# Patient Record
Sex: Male | Born: 1940 | Race: White | Hispanic: No | Marital: Married | Smoking: Never smoker
Health system: Southern US, Community
[De-identification: ages and names within clinical notes are randomized; demographics above are authoritative.]

## PROBLEM LIST (undated history)

## (undated) DIAGNOSIS — T7840XA Allergy, unspecified, initial encounter: Secondary | ICD-10-CM

## (undated) DIAGNOSIS — E785 Hyperlipidemia, unspecified: Secondary | ICD-10-CM

## (undated) DIAGNOSIS — E039 Hypothyroidism, unspecified: Secondary | ICD-10-CM

## (undated) DIAGNOSIS — I350 Nonrheumatic aortic (valve) stenosis: Secondary | ICD-10-CM

## (undated) DIAGNOSIS — M1990 Unspecified osteoarthritis, unspecified site: Secondary | ICD-10-CM

## (undated) DIAGNOSIS — N529 Male erectile dysfunction, unspecified: Secondary | ICD-10-CM

## (undated) DIAGNOSIS — N400 Enlarged prostate without lower urinary tract symptoms: Secondary | ICD-10-CM

## (undated) DIAGNOSIS — Z86010 Personal history of colonic polyps: Secondary | ICD-10-CM

## (undated) DIAGNOSIS — H269 Unspecified cataract: Secondary | ICD-10-CM

## (undated) DIAGNOSIS — K219 Gastro-esophageal reflux disease without esophagitis: Secondary | ICD-10-CM

## (undated) DIAGNOSIS — I10 Essential (primary) hypertension: Secondary | ICD-10-CM

## (undated) DIAGNOSIS — E669 Obesity, unspecified: Secondary | ICD-10-CM

## (undated) DIAGNOSIS — E291 Testicular hypofunction: Secondary | ICD-10-CM

## (undated) DIAGNOSIS — Z860101 Personal history of adenomatous and serrated colon polyps: Secondary | ICD-10-CM

## (undated) HISTORY — DX: Personal history of adenomatous and serrated colon polyps: Z86.0101

## (undated) HISTORY — DX: Testicular hypofunction: E29.1

## (undated) HISTORY — DX: Hypothyroidism, unspecified: E03.9

## (undated) HISTORY — DX: Unspecified cataract: H26.9

## (undated) HISTORY — DX: Personal history of colonic polyps: Z86.010

## (undated) HISTORY — DX: Essential (primary) hypertension: I10

## (undated) HISTORY — DX: Obesity, unspecified: E66.9

## (undated) HISTORY — DX: Hyperlipidemia, unspecified: E78.5

## (undated) HISTORY — DX: Unspecified osteoarthritis, unspecified site: M19.90

## (undated) HISTORY — DX: Gastro-esophageal reflux disease without esophagitis: K21.9

## (undated) HISTORY — DX: Nonrheumatic aortic (valve) stenosis: I35.0

## (undated) HISTORY — DX: Allergy, unspecified, initial encounter: T78.40XA

## (undated) HISTORY — PX: APPENDECTOMY: SHX54

## (undated) HISTORY — DX: Benign prostatic hyperplasia without lower urinary tract symptoms: N40.0

## (undated) HISTORY — DX: Male erectile dysfunction, unspecified: N52.9

---

## 1968-11-16 HISTORY — PX: HERNIA REPAIR: SHX51

## 1998-12-23 ENCOUNTER — Encounter: Payer: Self-pay | Admitting: Specialist

## 1998-12-23 ENCOUNTER — Ambulatory Visit (HOSPITAL_COMMUNITY): Admission: RE | Admit: 1998-12-23 | Discharge: 1998-12-23 | Payer: Self-pay | Admitting: Specialist

## 1999-01-09 ENCOUNTER — Other Ambulatory Visit: Admission: RE | Admit: 1999-01-09 | Discharge: 1999-01-09 | Payer: Self-pay

## 2001-03-03 ENCOUNTER — Encounter: Admission: RE | Admit: 2001-03-03 | Discharge: 2001-06-01 | Payer: Self-pay | Admitting: *Deleted

## 2003-08-27 ENCOUNTER — Ambulatory Visit (HOSPITAL_COMMUNITY): Admission: RE | Admit: 2003-08-27 | Discharge: 2003-08-27 | Payer: Self-pay | Admitting: Gastroenterology

## 2003-08-27 ENCOUNTER — Encounter (INDEPENDENT_AMBULATORY_CARE_PROVIDER_SITE_OTHER): Payer: Self-pay | Admitting: *Deleted

## 2006-06-17 ENCOUNTER — Ambulatory Visit: Payer: Self-pay | Admitting: Family Medicine

## 2006-11-04 ENCOUNTER — Ambulatory Visit: Payer: Self-pay | Admitting: Family Medicine

## 2007-02-01 ENCOUNTER — Ambulatory Visit: Payer: Self-pay | Admitting: Family Medicine

## 2007-04-12 ENCOUNTER — Ambulatory Visit: Payer: Self-pay | Admitting: Family Medicine

## 2007-12-26 ENCOUNTER — Ambulatory Visit: Payer: Self-pay | Admitting: Family Medicine

## 2008-04-10 ENCOUNTER — Ambulatory Visit: Payer: Self-pay | Admitting: Family Medicine

## 2008-04-19 ENCOUNTER — Ambulatory Visit: Payer: Self-pay | Admitting: Family Medicine

## 2008-08-16 HISTORY — PX: COLONOSCOPY: SHX174

## 2008-08-20 ENCOUNTER — Ambulatory Visit: Payer: Self-pay | Admitting: Family Medicine

## 2009-06-03 ENCOUNTER — Ambulatory Visit: Payer: Self-pay | Admitting: Family Medicine

## 2009-12-09 ENCOUNTER — Ambulatory Visit: Payer: Self-pay | Admitting: Family Medicine

## 2010-06-23 ENCOUNTER — Ambulatory Visit: Payer: Self-pay | Admitting: Family Medicine

## 2011-02-23 ENCOUNTER — Ambulatory Visit (INDEPENDENT_AMBULATORY_CARE_PROVIDER_SITE_OTHER): Payer: Medicare Other | Admitting: Family Medicine

## 2011-02-23 DIAGNOSIS — M461 Sacroiliitis, not elsewhere classified: Secondary | ICD-10-CM

## 2011-02-23 DIAGNOSIS — E039 Hypothyroidism, unspecified: Secondary | ICD-10-CM

## 2011-02-23 DIAGNOSIS — E291 Testicular hypofunction: Secondary | ICD-10-CM

## 2011-02-23 DIAGNOSIS — Z79899 Other long term (current) drug therapy: Secondary | ICD-10-CM

## 2011-03-19 ENCOUNTER — Encounter: Payer: Self-pay | Admitting: Family Medicine

## 2011-04-03 NOTE — Op Note (Signed)
NAME:  Mitchell Humphrey, Mitchell Humphrey                              ACCOUNT NO.:  [id number]   MEDICAL RECORD NO.:  [id number]                   PATIENT TYPE:  AMB   LOCATION:  ENDO                                 FACILITY:  [facility]   PHYSICIAN:  Petra Kuba, M.D.                 DATE OF BIRTH:  [birth year]   DATE OF PROCEDURE:  08/27/2003  DATE OF DISCHARGE:                                 OPERATIVE REPORT   PROCEDURE:  Colonoscopy with hot biopsy.   INDICATIONS:  The patient with some bright red blood per rectum probably due  to hemorrhoids, due for a colonic screening.  Consent was signed after risks  and benefits, methods and options wee thoroughly discussed in the office.   MEDICINES USED:  Demerol 50, Versed 5.   DESCRIPTION OF PROCEDURE:  The rectal inspection is pertinent for external  hemorrhoids.  A small digital exam was negative.  The video colonoscope was  inserted, easily advanced around the colon to the cecum.  This did not  require any abdominal pressure or any position changes.  No abnormalities  were seen on insertion.  The cecum was identified by the appendiceal orifice  and the ileocecal valve.  The prep was adequately.  There was some liquid  stool that required washing and suctioning.  In the cecal pole, a tiny polyp  was seen and was carefully hot biopsied on a setting of 15.  The scope was  slowly withdrawn.  In the mid transverse, another tiny polyp was seen and  was hot biopsied on a setting of 20:20.  Both polyps were put in the same  container.  The scope was further withdrawn.  No other abnormalities were  seen as we slowly withdrew back to the rectum.  The anorectal pull through  on retroflexion confirmed some small hemorrhoids.  The scope was re-inserted  a short ways up the left side of the colon.  The air was suctioned.  The  scope removed.  The patient tolerated the procedure well.  There were no  obvious immediate complications.   ENDOSCOPIC DIAGNOSES:  1. Internal  and external hemorrhoids.  2. Two tiny polyps in the transverse and cecum, hot biopsied.  3. Otherwise within normal limits to the cecum.   PLAN:  Await pathology.  Probably recheck colon screening in five years.  Happy to see back p.r.n. otherwise return to the care of Dr. Excell Seltzer for the  customary health care maintenance to include yearly rectals and guaiacs.                                                Petra Kuba, M.D.    MEM/MEDQ  D:  08/27/2003  T:  08/27/2003  Job:  [id number]  cc:   Christella Noa, M.D.  [address]  [place], [place] [address]  Fax: [phone]

## 2011-10-28 ENCOUNTER — Telehealth: Payer: Self-pay | Admitting: Internal Medicine

## 2011-10-28 NOTE — Telephone Encounter (Signed)
Refill the AndroGel but make sure he gets an appointment

## 2011-10-29 ENCOUNTER — Other Ambulatory Visit: Payer: Self-pay

## 2011-10-29 MED ORDER — TESTOSTERONE 20.25 MG/ACT (1.62%) TD GEL: 2.0000 | Freq: Every day | TRANSDERMAL | Status: DC

## 2011-10-29 NOTE — Telephone Encounter (Signed)
cheri said she called it in 12/13

## 2012-05-25 ENCOUNTER — Encounter: Payer: Self-pay | Admitting: Medical

## 2012-05-25 ENCOUNTER — Other Ambulatory Visit: Payer: Self-pay | Admitting: Medical

## 2012-05-25 ENCOUNTER — Ambulatory Visit (INDEPENDENT_AMBULATORY_CARE_PROVIDER_SITE_OTHER): Payer: Medicare Other | Admitting: Medical

## 2012-05-25 VITALS — BP 112/70 | HR 60 | Temp 97.9°F | Resp 16 | Wt 236.0 lb

## 2012-05-25 DIAGNOSIS — Z23 Encounter for immunization: Secondary | ICD-10-CM

## 2012-05-25 DIAGNOSIS — I10 Essential (primary) hypertension: Secondary | ICD-10-CM

## 2012-05-25 DIAGNOSIS — Z125 Encounter for screening for malignant neoplasm of prostate: Secondary | ICD-10-CM

## 2012-05-25 DIAGNOSIS — E039 Hypothyroidism, unspecified: Secondary | ICD-10-CM

## 2012-05-25 DIAGNOSIS — E291 Testicular hypofunction: Secondary | ICD-10-CM

## 2012-05-25 DIAGNOSIS — E785 Hyperlipidemia, unspecified: Secondary | ICD-10-CM

## 2012-05-25 LAB — COMPREHENSIVE METABOLIC PANEL
ALT: 15 U/L (ref 0–53)
AST: 13 U/L (ref 0–37)
Albumin: 4.4 g/dL (ref 3.5–5.2)
Alkaline Phosphatase: 37 U/L — ABNORMAL LOW (ref 39–117)
BUN: 14 mg/dL (ref 6–23)
CO2: 26 mEq/L (ref 19–32)
Calcium: 9.6 mg/dL (ref 8.4–10.5)
Chloride: 101 mEq/L (ref 96–112)
Creat: 0.92 mg/dL (ref 0.50–1.35)
Glucose, Bld: 94 mg/dL (ref 70–99)
Potassium: 4.9 mEq/L (ref 3.5–5.3)
Sodium: 139 mEq/L (ref 135–145)
Total Bilirubin: 0.5 mg/dL (ref 0.3–1.2)
Total Protein: 6.9 g/dL (ref 6.0–8.3)

## 2012-05-25 LAB — POCT URINALYSIS DIPSTICK
Glucose, UA: NEGATIVE
Ketones, UA: NEGATIVE
Leukocytes, UA: NEGATIVE
Protein, UA: NEGATIVE
Spec Grav, UA: <=1.005
Urobilinogen, UA: NEGATIVE

## 2012-05-25 LAB — LIPID PANEL
Cholesterol: 234 mg/dL — ABNORMAL HIGH (ref 0–200)
Triglycerides: 205 mg/dL — ABNORMAL HIGH (ref <150)

## 2012-05-25 LAB — CBC
Hemoglobin: 17.5 g/dL — ABNORMAL HIGH (ref 13.0–17.0)
MCH: 28 pg (ref 26.0–34.0)
MCV: 81 fL (ref 78.0–100.0)
Platelets: 259 10*3/uL (ref 150–400)
RBC: 6.25 MIL/uL — ABNORMAL HIGH (ref 4.22–5.81)
WBC: 6.4 10*3/uL (ref 4.0–10.5)

## 2012-05-25 LAB — TSH: TSH: 1.499 u[IU]/mL (ref 0.350–4.500)

## 2012-05-25 MED ORDER — CETIRIZINE HCL 10 MG PO TABS: 10.0000 mg | ORAL_TABLET | ORAL | Status: DC | PRN

## 2012-05-25 MED ORDER — TESTOSTERONE 20.25 MG/ACT (1.62%) TD GEL: 2.0000 | Freq: Every day | TRANSDERMAL | Status: DC

## 2012-05-25 MED ORDER — ENALAPRIL-HYDROCHLOROTHIAZIDE 10-25 MG PO TABS: 1.0000 | ORAL_TABLET | Freq: Every day | ORAL | Status: DC

## 2012-05-25 MED ORDER — LEVOTHYROXINE SODIUM 125 MCG PO TABS: 125.0000 ug | ORAL_TABLET | Freq: Every day | ORAL | Status: DC

## 2012-05-25 NOTE — Addendum Note (Signed)
Addended by: Debbrah Alar F on: 05/25/2012 08:46 AM   Modules accepted: Orders

## 2012-05-25 NOTE — Progress Notes (Signed)
Subjective: Here for general recheck.  Needs refills.  On medication for hypertension, thyroid, hypogonadism, and takes OTC fish oil for lipids.  In general been doing well, no c/o.   Checks BP once in a while, gets normal readings.  He is completely out of Androgel for the last 3 days, but still has a few left of all his other medications.  Gets 90 day supply to [facility].  He is fasting today.  He is a nonsmoker.  Exercises with walking, works in [employer].  Tries to eat healthy, but not much fried foods.  Lately he is working day and night, so sometimes diet not the best.   Eats a lot of baked or broiled foods, salads.     No other c/o.  The following portions of the patient's history were reviewed and updated as appropriate: allergies, current medications, past family history, past medical history, past social history, past surgical history and problem list.  Past Medical History  Diagnosis Date  . Hypertension   . Obesity   . Hypothyroid   . Hx of adenomatous colonic polyps   . GERD (gastroesophageal reflux disease)   . BPH (benign prostatic hyperplasia)   . Hypogonadism male   . Erectile dysfunction   . Dyslipidemia   . Allergy     Allergies  Allergen Reactions  . Penicillins     Review of Systems ROS reviewed and was negative other than noted in HPI or above.    Objective:   Physical Exam  General appearance: alert, no distress, WD/WN Oral cavity: MMM, no lesions Neck: supple, no lymphadenopathy, no thyromegaly, no masses Heart: RRR, normal S1, S2, no murmurs Lungs: CTA bilaterally, no wheezes, rhonchi, or rales Abdomen: +bs, soft, non tender, non distended, no masses, no hepatomegaly, no splenomegaly Pulses: 2+ symmetric, upper and lower extremities, normal cap refill   Assessment and Plan :     Encounter Diagnoses  Name Primary?  . Essential hypertension, benign Yes  . Hypothyroidism   . Dyslipidemia   . Hypogonadism male   . Special screening for  malignant neoplasm of prostate    HTN - controlled, c/t same medication, refills today  Hypothyroidism - labs today, c/t same meds  Dyslipidemia - labs today  Hypogonadism male - scripts today, labs today  PSA screening today  Labs and refills today.  Advised healthy diet, exercise regularly, sunscreen since he is outdoors often working.  Updated his Tdap and Pneumococcal vaccines today, VIS and counseling given.

## 2012-05-26 LAB — PSA, MEDICARE: PSA: 7.01 ng/mL — ABNORMAL HIGH (ref <=4.00)

## 2012-05-31 ENCOUNTER — Other Ambulatory Visit: Payer: Self-pay | Admitting: Medical

## 2012-05-31 DIAGNOSIS — E291 Testicular hypofunction: Secondary | ICD-10-CM

## 2012-05-31 DIAGNOSIS — R972 Elevated prostate specific antigen [PSA]: Secondary | ICD-10-CM

## 2012-05-31 LAB — FSH/LH: LH: 0.6 m[IU]/mL — ABNORMAL LOW (ref 1.5–9.3)

## 2012-05-31 LAB — PSA, TOTAL AND FREE: PSA: 6.18 ng/mL — ABNORMAL HIGH (ref <=4.00)

## 2012-05-31 LAB — PROLACTIN: Prolactin: 4.7 ng/mL (ref 2.1–17.1)

## 2012-05-31 MED ORDER — ATORVASTATIN CALCIUM 20 MG PO TABS: 20.0000 mg | ORAL_TABLET | Freq: Every day | ORAL | Status: DC

## 2012-06-09 ENCOUNTER — Telehealth: Payer: Self-pay | Admitting: Medical

## 2012-06-10 NOTE — Telephone Encounter (Signed)
Urologist Dr. Wanda Plump is not longer at [facility] urology that i have him scheduled with Dr. Vernie Ammons on august 22,2013 @ 1:00pm . Will fax over OV fax # [phone]

## 2012-06-10 NOTE — Telephone Encounter (Signed)
Pt states he has not seen a urologist before on a colonoscopy doctor. Would you like me to send him to [facility]>?

## 2012-06-10 NOTE — Telephone Encounter (Signed)
pls pull chart, as i am pretty sure he saw urology once before.

## 2012-06-10 NOTE — Telephone Encounter (Signed)
pls pull chart or ask Mitchell Humphrey to reprint labs for [facility], LH, free PSA, Prolactin.  Per my last instructions to Mitchell Humphrey, his labs are abnormal.  I want to refer him back to his Urologist who he has seen in the past.  The referral is for hypogonadism, lack of response to testosterone and elevated PSA.   Check to see because may have already made the referral and sent the labs and OV note to them.  Verify and let pt know.

## 2012-06-13 ENCOUNTER — Telehealth: Payer: Self-pay | Admitting: Family Medicine

## 2012-06-13 ENCOUNTER — Encounter: Payer: Self-pay | Admitting: Medical

## 2012-06-13 NOTE — Telephone Encounter (Signed)
Wife called and states they found the name of the urologist that the pt has used in the past.  Dr. Boston Service at [facility] [phone].  Wife wants to see if you can get him in quicker with this dr.

## 2012-06-13 NOTE — Telephone Encounter (Signed)
Look at previous telephone encounter. It notes that that Dr. She requested is no longer there! Pt was notified of that and what Dr. Quincy Carnes will be seeing

## 2012-06-14 ENCOUNTER — Telehealth: Payer: Self-pay | Admitting: Internal Medicine

## 2012-06-14 NOTE — Telephone Encounter (Signed)
pt wife called stating she got him with a urologist tomorrow in [place] to be seen with Dr. Fabiola Backer. and i faxed over labs and ov notes for the Dr. to view. fax # [phone]. i will call and cancel his appt for [facility]

## 2012-07-25 ENCOUNTER — Telehealth: Payer: Self-pay | Admitting: Internal Medicine

## 2012-07-26 NOTE — Telephone Encounter (Signed)
Received refill request for testosterone.   i reviewed Urology note from where he saw them in July.  They were holding off on testosterone therapy until the PSA was back to normal.  He needs to f/u with Urology about what to do with his testosterone treatment at this point.  Has he seen them in f/u since visit in July?

## 2012-07-26 NOTE — Telephone Encounter (Signed)
Patient chart is on your desk. CLS

## 2012-07-26 NOTE — Telephone Encounter (Signed)
Pull chart.  We had referred to urology.

## 2012-07-26 NOTE — Telephone Encounter (Signed)
Patient was made aware that he will need to follow up with Urology about his Testosterone treatment. CLS

## 2013-03-15 DIAGNOSIS — R972 Elevated prostate specific antigen [PSA]: Secondary | ICD-10-CM | POA: Insufficient documentation

## 2013-03-15 DIAGNOSIS — R350 Frequency of micturition: Secondary | ICD-10-CM | POA: Insufficient documentation

## 2013-03-15 DIAGNOSIS — N3941 Urge incontinence: Secondary | ICD-10-CM | POA: Insufficient documentation

## 2013-04-01 ENCOUNTER — Other Ambulatory Visit: Payer: Self-pay | Admitting: Medical

## 2013-04-03 NOTE — Telephone Encounter (Signed)
PATIENT NEEDS TO SCHEDULE A FOLLOW UP VISIT BEFORE HIS MEDICATIONS RUNS OUT

## 2013-04-24 ENCOUNTER — Telehealth: Payer: Self-pay | Admitting: Medical

## 2013-04-25 ENCOUNTER — Other Ambulatory Visit: Payer: Self-pay | Admitting: Family Medicine

## 2013-04-25 DIAGNOSIS — I10 Essential (primary) hypertension: Secondary | ICD-10-CM

## 2013-04-25 MED ORDER — ENALAPRIL-HYDROCHLOROTHIAZIDE 10-25 MG PO TABS: 1.0000 | ORAL_TABLET | Freq: Every day | ORAL | Status: DC

## 2013-04-25 NOTE — Telephone Encounter (Signed)
Patients refill was sent to the pharmacy. CLS

## 2013-06-05 ENCOUNTER — Telehealth: Payer: Self-pay | Admitting: Family Medicine

## 2013-06-05 NOTE — Telephone Encounter (Signed)
Message copied by Janeice Robinson on Mon Jun 05, 2013  4:29 PM ------      Message from: Jac Canavan      Created: Mon Jun 05, 2013  8:01 AM       Due for yearly f/u ------

## 2013-06-05 NOTE — Telephone Encounter (Signed)
Patient is aware about setting up an appointment but he states he will have to call back to set the appointment up once he looks at his schedule. CLS

## 2013-07-03 ENCOUNTER — Other Ambulatory Visit: Payer: Self-pay | Admitting: Medical

## 2013-10-26 ENCOUNTER — Telehealth: Payer: Self-pay | Admitting: Internal Medicine

## 2013-10-26 NOTE — Telephone Encounter (Signed)
Faxed medical records to [facility] @ [phone]

## 2015-02-04 DIAGNOSIS — Z85820 Personal history of malignant melanoma of skin: Secondary | ICD-10-CM | POA: Insufficient documentation

## 2016-04-05 ENCOUNTER — Emergency Department (HOSPITAL_COMMUNITY)
Admission: EM | Admit: 2016-04-05 | Discharge: 2016-04-05 | Disposition: A | Discharge status: Home / Self Care | Payer: Medicare (Managed Care) | Attending: Emergency Medicine | Admitting: Emergency Medicine

## 2016-04-05 ENCOUNTER — Encounter (HOSPITAL_COMMUNITY): Payer: Self-pay | Admitting: Family Medicine

## 2016-04-05 DIAGNOSIS — Z880 Allergy status to penicillin: Secondary | ICD-10-CM | POA: Insufficient documentation

## 2016-04-05 DIAGNOSIS — Z86010 Personal history of colonic polyps: Secondary | ICD-10-CM | POA: Diagnosis not present

## 2016-04-05 DIAGNOSIS — Z87438 Personal history of other diseases of male genital organs: Secondary | ICD-10-CM | POA: Diagnosis not present

## 2016-04-05 DIAGNOSIS — Z8719 Personal history of other diseases of the digestive system: Secondary | ICD-10-CM | POA: Diagnosis not present

## 2016-04-05 DIAGNOSIS — E785 Hyperlipidemia, unspecified: Secondary | ICD-10-CM | POA: Insufficient documentation

## 2016-04-05 DIAGNOSIS — E669 Obesity, unspecified: Secondary | ICD-10-CM | POA: Diagnosis not present

## 2016-04-05 DIAGNOSIS — Z7982 Long term (current) use of aspirin: Secondary | ICD-10-CM | POA: Diagnosis not present

## 2016-04-05 DIAGNOSIS — Z79899 Other long term (current) drug therapy: Secondary | ICD-10-CM | POA: Diagnosis not present

## 2016-04-05 DIAGNOSIS — I10 Essential (primary) hypertension: Secondary | ICD-10-CM | POA: Insufficient documentation

## 2016-04-05 DIAGNOSIS — M545 Low back pain: Secondary | ICD-10-CM | POA: Diagnosis present

## 2016-04-05 DIAGNOSIS — M5432 Sciatica, left side: Secondary | ICD-10-CM

## 2016-04-05 DIAGNOSIS — E039 Hypothyroidism, unspecified: Secondary | ICD-10-CM | POA: Insufficient documentation

## 2016-04-05 MED ORDER — NAPROXEN 250 MG PO TABS
500.0000 mg | ORAL_TABLET | Freq: Once | ORAL | Status: AC
  Administered 2016-04-05: 500 mg via ORAL
  Filled 2016-04-05: qty 2

## 2016-04-05 MED ORDER — NAPROXEN 500 MG PO TABS: 500.0000 mg | ORAL_TABLET | Freq: Two times a day (BID) | ORAL | Status: DC

## 2016-04-05 MED ORDER — DIAZEPAM 5 MG PO TABS: 5.0000 mg | ORAL_TABLET | Freq: Two times a day (BID) | ORAL | Status: DC

## 2016-04-05 MED ORDER — DIAZEPAM 5 MG PO TABS
5.0000 mg | ORAL_TABLET | Freq: Once | ORAL | Status: AC
  Administered 2016-04-05: 5 mg via ORAL
  Filled 2016-04-05: qty 1

## 2016-04-05 NOTE — Discharge Instructions (Signed)
[name],  Nice meeting you! Please follow-up with your primary care provider within one week. Return to the emergency department if you develop fevers, chills, increased pain, loss of bowel or bladder control, nausea, vomiting, new/worsening symptoms. Feel better soon!  S. Wendie Simmer, PA-C

## 2016-04-05 NOTE — ED Notes (Signed)
Declined W/C at D/C and was escorted to lobby by RN. 

## 2016-04-05 NOTE — ED Provider Notes (Signed)
CSN: [id number]     Arrival date & time 04/05/16  1145 History   First MD Initiated Contact with Patient 04/05/16 1319     Chief Complaint  Patient presents with  . Back Pain  . Leg Pain   HPI   Mitchell Humphrey is a 75 y.o. male PMH significant for HTN, obesity presenting with a 1 day history of left-sided lower back pain. He describes the pain as 10/10 pain scale, worsened with movement, radiating down left leg posteriorly, constant, sharp. He denies fevers, chills, chest pain, shortness of breath, abdominal pain, nausea, vomiting, loss of bowel or bladder control, recent steroid injections, IV drug use.  Past Medical History  Diagnosis Date  . Hypertension   . Obesity   . Hypothyroid   . Hx of adenomatous colonic polyps   . GERD (gastroesophageal reflux disease)   . BPH (benign prostatic hyperplasia)   . Hypogonadism male   . Erectile dysfunction   . Dyslipidemia   . Allergy    Past Surgical History  Procedure Laterality Date  . Colonoscopy  08/2008    Dr. Watt Climes   History reviewed. No pertinent family history. Social History  Substance Use Topics  . Smoking status: Never Smoker   . Smokeless tobacco: None  . Alcohol Use: No    Review of Systems  Ten systems are reviewed and are negative for acute change except as noted in the HPI  Allergies  Penicillins  Home Medications   Prior to Admission medications   Medication Sig Start Date End Date Taking? Authorizing Provider  aspirin 81 MG tablet Take 81 mg by mouth daily.      Historical Provider, MD  atorvastatin (LIPITOR) 20 MG tablet TAKE 1 TABLET DAILY 07/03/13   Camelia Eng Tysinger, PA-C  cetirizine (ZYRTEC) 10 MG tablet Take 1 tablet (10 mg total) by mouth as needed. 05/25/12   Camelia Eng Tysinger, PA-C  diazepam (VALIUM) 5 MG tablet Take 1 tablet (5 mg total) by mouth 2 (two) times daily. 04/05/16   [place] Lions, PA-C  enalapril-hydrochlorothiazide (VASERETIC) 10-25 MG per tablet TAKE 1 TABLET DAILY 07/03/13   Camelia Eng Tysinger, PA-C  fish oil-omega-3 fatty acids 1000 MG capsule Take 1 g by mouth daily.      Historical Provider, MD  Multiple Vitamin (MULTIVITAMIN) tablet Take 1 tablet by mouth daily.      Historical Provider, MD  naproxen (NAPROSYN) 500 MG tablet Take 1 tablet (500 mg total) by mouth 2 (two) times daily. 04/05/16   [place] Lions, PA-C  SYNTHROID 125 MCG tablet TAKE 1 TABLET DAILY 07/03/13   Carlena Hurl, PA-C  Testosterone (ANDROGEL PUMP) 20.25 MG/ACT (1.62%) GEL Place 2 Squirts onto the skin daily. 05/25/12   Camelia Eng Tysinger, PA-C   BP 139/67 mmHg  Pulse 88  Temp(Src) 97.8 F (36.6 C) (Oral)  Resp 18  SpO2 100% Physical Exam  Constitutional: He is oriented to person, place, and time. He appears well-developed and well-nourished. No distress.  HENT:  Head: Normocephalic and atraumatic.  Mouth/Throat: Oropharynx is clear and moist. No oropharyngeal exudate.  Eyes: Conjunctivae are normal. Pupils are equal, round, and reactive to light. Right eye exhibits no discharge. Left eye exhibits no discharge. No scleral icterus.  Neck: No tracheal deviation present.  Cardiovascular: Normal rate.   Pulmonary/Chest: Effort normal. No respiratory distress.  Abdominal: Soft. Bowel sounds are normal. He exhibits no distension.  Musculoskeletal: He exhibits tenderness. He exhibits no edema.  Left-sided  SI tenderness  Lymphadenopathy:    He has no cervical adenopathy.  Neurological: He is alert and oriented to person, place, and time. No cranial nerve deficit. Coordination normal.  Skin: Skin is warm and dry. No rash noted. He is not diaphoretic. No erythema.  Psychiatric: He has a normal mood and affect. His behavior is normal.  Nursing note and vitals reviewed.   ED Course  Procedures   MDM   Final diagnoses:  Sciatica of left side   Normal neurological exam, no evidence of urinary incontinence or retention, pain is consistently reproducible. There is no evidence of AAA or  concern for dissection at this time.   Patient can walk but states is painful.  No loss of bowel or bladder control.  No concern for cauda equina.  No fever, night sweats, weight loss, h/o cancer, IVDU.  Pain treated here in the department with adequate improvement. RICE protocol and pain medicine indicated and discussed with patient. I have also discussed reasons to return immediately to the ER.  Patient expresses understanding and agrees with plan.    Isabella Lions, PA-C 04/05/16 [place], MD 04/06/16 [phone]

## 2016-04-05 NOTE — ED Notes (Signed)
Pt here with left sided lower back pain radiating into buttocks and leg. sts stared Saturday while he was driving.

## 2017-08-09 DIAGNOSIS — M19041 Primary osteoarthritis, right hand: Secondary | ICD-10-CM | POA: Insufficient documentation

## 2017-08-09 DIAGNOSIS — N400 Enlarged prostate without lower urinary tract symptoms: Secondary | ICD-10-CM | POA: Insufficient documentation

## 2017-08-09 DIAGNOSIS — E7800 Pure hypercholesterolemia, unspecified: Secondary | ICD-10-CM | POA: Insufficient documentation

## 2017-08-09 DIAGNOSIS — M159 Polyosteoarthritis, unspecified: Secondary | ICD-10-CM | POA: Insufficient documentation

## 2018-05-18 DIAGNOSIS — G8929 Other chronic pain: Secondary | ICD-10-CM | POA: Insufficient documentation

## 2018-05-18 DIAGNOSIS — M545 Low back pain, unspecified: Secondary | ICD-10-CM | POA: Insufficient documentation

## 2018-12-20 DIAGNOSIS — K219 Gastro-esophageal reflux disease without esophagitis: Secondary | ICD-10-CM | POA: Insufficient documentation

## 2019-07-01 DIAGNOSIS — H903 Sensorineural hearing loss, bilateral: Secondary | ICD-10-CM | POA: Insufficient documentation

## 2019-12-17 DIAGNOSIS — I82409 Acute embolism and thrombosis of unspecified deep veins of unspecified lower extremity: Secondary | ICD-10-CM

## 2019-12-17 HISTORY — DX: Acute embolism and thrombosis of unspecified deep veins of unspecified lower extremity: I82.409

## 2020-10-23 ENCOUNTER — Encounter: Payer: Self-pay | Admitting: Physician Assistant

## 2020-10-23 ENCOUNTER — Other Ambulatory Visit: Payer: Self-pay

## 2020-10-23 ENCOUNTER — Ambulatory Visit (INDEPENDENT_AMBULATORY_CARE_PROVIDER_SITE_OTHER): Payer: Medicare Other | Admitting: Physician Assistant

## 2020-10-23 VITALS — BP 150/80 | HR 76 | Temp 98.2°F | Ht 68.5 in | Wt 257.2 lb

## 2020-10-23 DIAGNOSIS — M19041 Primary osteoarthritis, right hand: Secondary | ICD-10-CM | POA: Diagnosis not present

## 2020-10-23 DIAGNOSIS — R011 Cardiac murmur, unspecified: Secondary | ICD-10-CM

## 2020-10-23 DIAGNOSIS — E039 Hypothyroidism, unspecified: Secondary | ICD-10-CM | POA: Diagnosis not present

## 2020-10-23 DIAGNOSIS — R0981 Nasal congestion: Secondary | ICD-10-CM

## 2020-10-23 DIAGNOSIS — E8881 Metabolic syndrome: Secondary | ICD-10-CM | POA: Insufficient documentation

## 2020-10-23 DIAGNOSIS — M19042 Primary osteoarthritis, left hand: Secondary | ICD-10-CM

## 2020-10-23 MED ORDER — MELOXICAM 15 MG PO TABS: 15.0000 mg | ORAL_TABLET | Freq: Every day | ORAL | 1 refills | Status: DC

## 2020-10-23 MED ORDER — FLUTICASONE PROPIONATE 50 MCG/ACT NA SUSP: 2.0000 | Freq: Every day | NASAL | 2 refills | Status: DC

## 2020-10-23 NOTE — Progress Notes (Signed)
Mitchell Humphrey is a 79 y.o. male is here to establish care.  I acted as a Education administrator for [employer], PA-C Anselmo Pickler, LPN   History of Present Illness:   Chief Complaint  Patient presents with  . Establish Care  . Sinus Problem  . Arthritis    HPI   Sinus problem Pt c/o nasal congestion with yellow drainage in the morning, having some mild headaches. Pt says he has been having sinus issues off and on x 1 yr or more. Takes zyrtec regularly. Did use a abx about 3-4 months ago and had good improvement of symptoms but they returned. Denies: purulent discharge, fever, chills, malaise, cough.  Arthritis Pt c/o arthritis in his hands, when he wakes up in the morning fingers are stiff and painful. Also L wrists and right shoulder. Pt is using Aleve with some relief. Has been going on for at least one year.   Hypothyroidism Currently takes synthroid 125 mcg daily. Takes each morning without food but does take with other medications.  Heart murmur Has been told in the past that he has a heart murmur. Denies: ever seeing cardiology, prior echo, current chest pain, SOB or leg swelling. Currently taking HCTZ 25 mg daily and enalapril 10 mg daily for his BP. Takes lipitor 20 mg daily and ASA 81 mg for his cholesterol.  Health Maintenance Due  Topic Date Due  . Hepatitis C Screening  Never done    Past Medical History:  Diagnosis Date  . Allergy   . Arthritis   . BPH (benign prostatic hyperplasia)   . Cataract   . Dyslipidemia   . Erectile dysfunction   . GERD (gastroesophageal reflux disease)   . Hx of adenomatous colonic polyps   . Hyperlipidemia   . Hypertension   . Hypogonadism male   . Hypothyroid   . Obesity      Social History   Tobacco Use  . Smoking status: Never Smoker  . Smokeless tobacco: Never Used  . Tobacco comment: He chews on a cigar  Vaping Use  . Vaping Use: Never used  Substance Use Topics  . Alcohol use: No  . Drug use: No    Past Surgical  History:  Procedure Laterality Date  . COLONOSCOPY  08/2008   Dr. Watt Climes  . HERNIA REPAIR  1970   [facility]    Family History  Problem Relation Age of Onset  . Breast cancer Mother   . Lung cancer Father   . Brain cancer Father   . Breast cancer Sister     PMHx, SurgHx, SocialHx, FamHx, Medications, and Allergies were reviewed in the Visit Navigator and updated as appropriate.   Patient Active Problem List   Diagnosis Date Noted  . Insulin resistance 10/23/2020  . Severe obesity (BMI 35.0-35.9 with comorbidity) ([place]) 08/05/2020  . 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 GERD (gastroesophageal reflux disease) 12/20/2018  . Chronic low back pain 05/18/2018  . Hypercholesterolemia 08/09/2017  . BPH (benign prostatic hyperplasia) 08/09/2017  . Osteoarthritis of both hands 08/09/2017  . 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 History of melanoma 02/04/2015  . Elevated prostate specific antigen (PSA) 03/15/2013  . Increased frequency of urination 03/15/2013  . Urge incontinence 03/15/2013  . Benign essential hypertension 05/25/2012  . Hypothyroidism (acquired) 05/25/2012  . Dyslipidemia 05/25/2012  . Hypogonadism male 05/25/2012    Social History   Tobacco Use  . Smoking status: Never Smoker  . Smokeless tobacco: Never Used  . Tobacco  comment: He chews on a cigar  Vaping Use  . Vaping Use: Never used  Substance Use Topics  . Alcohol use: No  . Drug use: No    Current Medications and Allergies:    Current Outpatient Medications:  .  aspirin 81 MG tablet, Take 81 mg by mouth daily.  , Disp: , Rfl:  .  atorvastatin (LIPITOR) 20 MG tablet, TAKE 1 TABLET DAILY, Disp: 90 tablet, Rfl: 3 .  BLACK ELDERBERRY PO, Take 1 capsule by mouth daily., Disp: , Rfl:  .  cetirizine (ZYRTEC) 10 MG tablet, Take 1 tablet (10 mg total) by mouth as needed., Disp: 90 tablet, Rfl: 3 .  enalapril (VASOTEC) 10 MG tablet, Take 10 mg by mouth daily., Disp: , Rfl:  .  famotidine  (PEPCID) 20 MG tablet, Take by mouth., Disp: , Rfl:  .  finasteride (PROSCAR) 5 MG tablet, Take 5 mg by mouth daily., Disp: , Rfl:  .  guaiFENesin (ROBITUSSIN) 100 MG/5ML liquid, Take by mouth., Disp: , Rfl:  .  hydrochlorothiazide (HYDRODIURIL) 25 MG tablet, Take 25 mg by mouth daily., Disp: , Rfl:  .  Multiple Vitamin (MULTIVITAMIN) tablet, Take 1 tablet by mouth daily.  , Disp: , Rfl:  .  MYRBETRIQ 25 MG TB24 tablet, Take 25 mg by mouth daily., Disp: , Rfl:  .  SYNTHROID 125 MCG tablet, TAKE 1 TABLET DAILY, Disp: 90 tablet, Rfl: 0 .  tamsulosin (FLOMAX) 0.4 MG CAPS capsule, Take 0.4 mg by mouth daily., Disp: , Rfl:  .  fluticasone (FLONASE) 50 MCG/ACT nasal spray, Place 2 sprays into both nostrils daily., Disp: 16 g, Rfl: 2 .  meloxicam (MOBIC) 15 MG tablet, Take 1 tablet (15 mg total) by mouth daily., Disp: 30 tablet, Rfl: 1   Allergies  Allergen Reactions  . Penicillins     Review of Systems   ROS  Negative unless otherwise specified per HPI.  Vitals:   Vitals:   10/23/20 1335  BP: (!) 150/80  Pulse: 76  Temp: 98.2 F (36.8 C)  TempSrc: Temporal  SpO2: 96%  Weight: 257 lb 4 oz (116.7 kg)  Height: 5' 8.5" (1.74 m)     Body mass index is 38.55 kg/m.   Physical Exam:    Physical Exam Vitals and nursing note reviewed.  Constitutional:      General: He is not in acute distress.    Appearance: He is well-developed. He is not ill-appearing or toxic-appearing.  Cardiovascular:     Rate and Rhythm: Normal rate and regular rhythm.     Pulses: Normal pulses.     Heart sounds: S1 normal and S2 normal. Murmur heard.      Comments: No LE edema Pulmonary:     Effort: Pulmonary effort is normal.     Breath sounds: Normal breath sounds.  Skin:    General: Skin is warm and dry.  Neurological:     Mental Status: He is alert.     GCS: GCS eye subscore is 4. GCS verbal subscore is 5. GCS motor subscore is 6.  Psychiatric:        Speech: Speech normal.        Behavior:  Behavior normal. Behavior is cooperative.        Assessment and Plan:    Markise was seen today for establish care, sinus problem and arthritis.  Diagnoses and all orders for this visit:  Hypothyroidism, unspecified type Update TSH, per chart review it has been >1 year. Will adjust  Synthroid 125 mcg as indicated. Follow-up to be determined based on lab results. -     TSH; Future -     TSH  Heart murmur Significant murmur on exam. He reports compliance with BP and cholesterol medications; BP in range today per JNC 8 guidelines.  Update blood work today and order echo. Referral to cardiology based on results and/or presence of symptoms. -     Comprehensive metabolic panel; Future -     CBC with Differential/Platelet; Future -     ECHOCARDIOGRAM COMPLETE; Future -     CBC with Differential/Platelet -     Comprehensive metabolic panel  Osteoarthritis of both hands, unspecified osteoarthritis type Update renal function panel. Trial mobic, stop aleve. Trial topical voltaren gel. If worsening, will refer to sports medicine vs PT.  Sinus congestion  No evidence of infection on exam. Trial daily flonase and nasal saline. Follow-up if worsening.  Due to number of chronic medical issues and limited time during appt, he was advised to follow-up with me in 1-2 months to review other chronic medical issues.  Other orders -     meloxicam (MOBIC) 15 MG tablet; Take 1 tablet (15 mg total) by mouth daily. -     fluticasone (FLONASE) 50 MCG/ACT nasal spray; Place 2 sprays into both nostrils daily.    CMA or LPN served as scribe during this visit. History, Physical, and Plan performed by medical provider. The above documentation has been reviewed and is accurate and complete.   Inda Coke, PA-C [place], [place] 10/23/2020  Follow-up: No follow-ups on file.

## 2020-10-23 NOTE — Patient Instructions (Signed)
It was great to see you! Let's follow-up in 2 months. We will update blood work today.  For your sinuses: Nasal saline spray (i.e., Simply Saline) or nasal saline lavage (i.e., NeilMed) is recommended as needed and prior to medicated nasal sprays. Start Flonase in AM and PM.  For your heart murmur: You will be contacted about scheduling an ultrasound of your heart  For your arthritis: Stop aleve and trial daily mobic Also recommend Voltaren Gel    You are to apply this gel to your injured body part twice daily (morning and evening).   A little goes a long way so you can use about a pea-sized amount for each area.  ? Spread this small amount over the area into a thin film and let it dry.  ? Be sure that you do not rub the gel into your skin for more than 10 or 15 seconds otherwise it can irritate you skin.   ? Once you apply the gel, please do not put any other lotion or clothing in contact with that area for 30 minutes to allow the gel to absorb into your skin.       Take care,  Inda Coke PA-C

## 2020-10-24 LAB — CBC WITH DIFFERENTIAL/PLATELET
Absolute Monocytes: 680 cells/uL (ref 200–950)
Basophils Absolute: 34 cells/uL (ref 0–200)
Basophils Relative: 0.4 %
Eosinophils Absolute: 272 cells/uL (ref 15–500)
Eosinophils Relative: 3.2 %
HCT: 42.9 % (ref 38.5–50.0)
Hemoglobin: 14.4 g/dL (ref 13.2–17.1)
Lymphs Abs: 1598 cells/uL (ref 850–3900)
MCH: 27.6 pg (ref 27.0–33.0)
MCHC: 33.6 g/dL (ref 32.0–36.0)
MCV: 82.2 fL (ref 80.0–100.0)
MPV: 9.9 fL (ref 7.5–12.5)
Monocytes Relative: 8 %
Neutro Abs: 5916 cells/uL (ref 1500–7800)
Neutrophils Relative %: 69.6 %
Platelets: 286 10*3/uL (ref 140–400)
RBC: 5.22 10*6/uL (ref 4.20–5.80)
RDW: 13.6 % (ref 11.0–15.0)
Total Lymphocyte: 18.8 %
WBC: 8.5 10*3/uL (ref 3.8–10.8)

## 2020-10-24 LAB — COMPREHENSIVE METABOLIC PANEL
AG Ratio: 2.1 (calc) (ref 1.0–2.5)
ALT: 22 U/L (ref 9–46)
AST: 18 U/L (ref 10–35)
Albumin: 4.5 g/dL (ref 3.6–5.1)
Alkaline phosphatase (APISO): 45 U/L (ref 35–144)
BUN: 18 mg/dL (ref 7–25)
CO2: 28 mmol/L (ref 20–32)
Calcium: 9.9 mg/dL (ref 8.6–10.3)
Chloride: 102 mmol/L (ref 98–110)
Creat: 0.87 mg/dL (ref 0.70–1.18)
Globulin: 2.1 g/dL (calc) (ref 1.9–3.7)
Glucose, Bld: 87 mg/dL (ref 65–99)
Potassium: 4.2 mmol/L (ref 3.5–5.3)
Sodium: 139 mmol/L (ref 135–146)
Total Bilirubin: 0.4 mg/dL (ref 0.2–1.2)
Total Protein: 6.6 g/dL (ref 6.1–8.1)

## 2020-10-24 LAB — TSH: TSH: 5.74 mIU/L — ABNORMAL HIGH (ref 0.40–4.50)

## 2020-10-25 ENCOUNTER — Other Ambulatory Visit: Payer: Self-pay | Admitting: Physician Assistant

## 2020-10-25 MED ORDER — LEVOTHYROXINE SODIUM 150 MCG PO TABS: 150.0000 ug | ORAL_TABLET | Freq: Every day | ORAL | 1 refills | Status: DC

## 2020-11-01 ENCOUNTER — Telehealth: Payer: Self-pay

## 2020-11-01 MED ORDER — LEVOTHYROXINE SODIUM 150 MCG PO TABS: 150.0000 ug | ORAL_TABLET | Freq: Every day | ORAL | 1 refills | Status: DC

## 2020-11-01 NOTE — Telephone Encounter (Signed)
levothyroxine (SYNTHROID) 150 MCG tablet  Please send this medication to [place], [place]  It was sent to [facility]. Pt states that is incorrect.

## 2020-11-01 NOTE — Telephone Encounter (Signed)
RX sent to [facility]

## 2020-12-03 ENCOUNTER — Telehealth: Payer: Self-pay

## 2020-12-03 NOTE — Telephone Encounter (Signed)
LVM asking to call back.  

## 2020-12-03 NOTE — Telephone Encounter (Signed)
See noted

## 2020-12-03 NOTE — Telephone Encounter (Signed)
Please call and offer virtual visit if he has not been seen.

## 2020-12-03 NOTE — Telephone Encounter (Signed)
Nurse Assessment Nurse: May, RN, Tammy Date/Time Eilene Ghazi Time): 12/03/2020 11:41:06 AM Confirm and document reason for call. If symptomatic, describe symptoms. ---Caller states since Saturday he started having a runny nose and a cough. Caller states he had a home test positive for covid today. No sob. Does the patient have any new or worsening symptoms? ---Yes Will a triage be completed? ---Yes Related visit to physician within the last 2 weeks? ---No Does the PT have any chronic conditions? (i.e. diabetes, asthma, this includes High risk factors for pregnancy, etc.) ---Yes List chronic conditions. ---htn, sinus Is this a behavioral health or substance abuse call? ---No Guidelines Guideline Title Affirmed Question Affirmed Notes Nurse Date/Time (Eastern Time) COVID-19 - Diagnosed or Suspected [1] COVID-19 diagnosed by positive lab test (e.g., PCR, rapid selftest kit) AND [2] mild symptoms (e.g., cough, fever, others) AND [1] no complications or SOB May, RN, Tammy 12/03/2020 11:42:16 AM Disp. Time Eilene Ghazi Time) Disposition Final User 12/03/2020 11:40:11 AM Attempt made - no message left May, RN, Tammy PLEASE NOTE: All timestamps contained within this report are represented as [place] Standard Time. CONFIDENTIALTY NOTICE: This fax transmission is intended only for the addressee. It contains information that is legally privileged, confidential or otherwise protected from use or disclosure. If you are not the intended recipient, you are strictly prohibited from reviewing, disclosing, copying using or disseminating any of this information or taking any action in reliance on or regarding this information. If you have received this fax in error, please notify us immediately by telephone so that we can arrange for its return to [place]. Phone: [phone], Toll-Free: [phone], Fax: [phone] Page: 2 of 2 Call Id: [id number] 12/03/2020 11:49:14 AM Home Care Yes May, RN, Tammy Caller  Disagree/Comply Comply Caller Understands Yes PreDisposition Did not know what to do Care Advice Given Per Guideline HOME CARE: * You should be able to treat this at home. REASSURANCE AND EDUCATION - POSITIVE COVID-19 LAB TEST AND MILD SYMPTOMS: * You had a recent lab test for COVID-19 and it came back positive. * From what you have told me, your symptoms are mild. That is reassuring. * The treatment is the same whether you have COVID-19, influenza or some other respiratory virus. GENERAL CARE ADVICE FOR COVID-19 SYMPTOMS: * Feeling dehydrated: Drink extra liquids. If the air in your home is dry, use a humidifier. * Fever: For fever over 101 F (38.3 C), take acetaminophen every 4 to 6 hours (Adults 650 mg) OR ibuprofen every 6 to 8 hours (Adults 400 mg). Before taking any medicine, read all the instructions on the package. Do not take aspirin unless your doctor has prescribed it for you. * Muscle aches, headache, and other pains: Often this comes and goes with the fever. Take acetaminophen every 4 to 6 hours (Adults 650 mg) OR ibuprofen every 6 to 8 hours (Adults 400 mg). Before taking any medicine, read all the instructions on the package. COUGH MEDICINES: * COUGH SYRUP WITH DEXTROMETHORPHAN: An over-the-counter cough syrup can help your cough. The most common cough suppressant in over-the-counter cough medicines is dextromethorphan. COUGHING SPELLS: * Drink warm fluids. Inhale warm mist. This can help relax the airway and also loosen up phlegm. HOW TO PROTECT OTHERS - WHEN YOU ARE SICK WITH COVID-19: * STAY HOME A MINIMUM OF 10 DAYS: Home isolation is needed for at least 10 days after the symptoms started. Stay home from school or work if you are sick. Do NOT go to religious services, child care centers, shopping, or  other public places. Do NOT use public transportation (e.g., bus, taxis, ride-sharing). Do NOT allow any visitors to your home. Leave the house only if you need to seek urgent  medical care. * COVER THE COUGH: Cough and sneeze into your shirt sleeve or inner elbow. Don't cough into your hand or the air. If available, cough into a tissue and throw it into a trash can. * [place] HANDS OFTEN: Wash hands often with soap and water. After coughing or sneezing are important times. If soap and water are not available, use an alcohol-based hand sanitizer with at least 60% alcohol, covering all surfaces of your hands and rubbing them together until they feel dry. Avoid touching your eyes, nose, and mouth with unwashed hands. * WEAR A MASK: Wear a facemask when around others. Always wear a facemask (if available) if you have to leave your home (such as going to a medical facility). CALL BACK IF: * You become worse * Chest pain or difficulty breathing occurs CARE ADVICE given per COVID-19 - DIAGNOSED OR SUSPECTED (Adult) guideline

## 2020-12-04 ENCOUNTER — Encounter: Payer: Self-pay | Admitting: Physician Assistant

## 2020-12-04 ENCOUNTER — Telehealth (INDEPENDENT_AMBULATORY_CARE_PROVIDER_SITE_OTHER): Payer: Medicare Other | Admitting: Physician Assistant

## 2020-12-04 VITALS — Temp 98.0°F | Ht 68.5 in | Wt 245.0 lb

## 2020-12-04 DIAGNOSIS — U071 COVID-19: Secondary | ICD-10-CM

## 2020-12-04 NOTE — Progress Notes (Signed)
Virtual Visit via Video   I connected with Mitchell Humphrey on 12/04/20 at  2:00 PM EST by a video enabled telemedicine application and verified that I am speaking with the correct person using two identifiers. Location patient: Home Location provider: [facility], Office Persons participating in the virtual visit: Mitchell Humphrey, [name], Anselmo Pickler, LPN   I discussed the limitations of evaluation and management by telemedicine and the availability of in person appointments. The patient expressed understanding and agreed to proceed.  I acted as a Education administrator for [employer], PA-C [employer], LPN   Subjective:   HPI:   Patient is requesting evaluation for possible COVID-19.  Symptom onset: Saturday  Travel/contacts: No  Vaccination status: 2 shots  Testing results: Home test Monday positive  Patient endorses the following symptoms: Headache, body aches, nasal congestion  Patient denies the following symptoms: Fever or chills, breathing concerns, chest discomfort  Drinking plenty of fluids  Symptoms are overall getting better with time.  Treatments tried: Robitussin DM, Mucinex DM, Advil  Patient risk factors: Current [id number]-42 risk of complications score: 5 Smoking status: OSIE Mitchell Humphrey  reports that he has never smoked. He has never used smokeless tobacco. If male, currently pregnant? []   Yes []   No  ROS: See pertinent positives and negatives per HPI.  Patient Active Problem List   Diagnosis Date Noted  . Insulin resistance 10/23/2020  . Severe obesity (BMI 35.0-35.9 with comorbidity) ([place]) 08/05/2020  . 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 GERD (gastroesophageal reflux disease) 12/20/2018  . Chronic low back pain 05/18/2018  . Hypercholesterolemia 08/09/2017  . BPH (benign prostatic hyperplasia) 08/09/2017  . Osteoarthritis of both hands 08/09/2017  . 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 History of melanoma 02/04/2015   . Elevated prostate specific antigen (PSA) 03/15/2013  . Increased frequency of urination 03/15/2013  . Urge incontinence 03/15/2013  . Benign essential hypertension 05/25/2012  . Hypothyroidism (acquired) 05/25/2012  . Dyslipidemia 05/25/2012  . Hypogonadism male 05/25/2012    Social History   Tobacco Use  . Smoking status: Never Smoker  . Smokeless tobacco: Never Used  . Tobacco comment: He chews on a cigar  Substance Use Topics  . Alcohol use: No    Current Outpatient Medications:  .  aspirin 81 MG tablet, Take 81 mg by mouth daily., Disp: , Rfl:  .  atorvastatin (LIPITOR) 20 MG tablet, TAKE 1 TABLET DAILY, Disp: 90 tablet, Rfl: 3 .  BLACK ELDERBERRY PO, Take 1 capsule by mouth daily., Disp: , Rfl:  .  cetirizine (ZYRTEC) 10 MG tablet, Take 1 tablet (10 mg total) by mouth as needed., Disp: 90 tablet, Rfl: 3 .  Cholecalciferol (VITAMIN D3) 125 MCG (5000 UT) CAPS, Take 1 capsule by mouth daily in the afternoon., Disp: , Rfl:  .  enalapril (VASOTEC) 10 MG tablet, Take 10 mg by mouth daily., Disp: , Rfl:  .  famotidine (PEPCID) 20 MG tablet, Take by mouth., Disp: , Rfl:  .  finasteride (PROSCAR) 5 MG tablet, Take 5 mg by mouth daily., Disp: , Rfl:  .  fluticasone (FLONASE) 50 MCG/ACT nasal spray, Place 2 sprays into both nostrils daily., Disp: 16 g, Rfl: 2 .  guaiFENesin (ROBITUSSIN) 100 MG/5ML liquid, Take by mouth., Disp: , Rfl:  .  hydrochlorothiazide (HYDRODIURIL) 25 MG tablet, Take 25 mg by mouth daily., Disp: , Rfl:  .  levothyroxine (SYNTHROID) 150 MCG tablet, Take 1 tablet (150 mcg total) by mouth daily before breakfast.,  Disp: 30 tablet, Rfl: 1 .  meloxicam (MOBIC) 15 MG tablet, Take 1 tablet (15 mg total) by mouth daily., Disp: 30 tablet, Rfl: 1 .  Multiple Vitamin (MULTIVITAMIN) tablet, Take 1 tablet by mouth daily., Disp: , Rfl:  .  MYRBETRIQ 25 MG TB24 tablet, Take 25 mg by mouth daily., Disp: , Rfl:  .  tamsulosin (FLOMAX) 0.4 MG CAPS capsule, Take 0.4 mg by mouth  daily., Disp: , Rfl:  .  vitamin A 3 MG (10000 UNITS) capsule, Take 10,000 Units by mouth daily., Disp: , Rfl:  .  vitamin C (ASCORBIC ACID) 500 MG tablet, Take 500 mg by mouth daily., Disp: , Rfl:   Allergies  Allergen Reactions  . Penicillins     Objective:   VITALS: Per patient if applicable, see vitals. GENERAL: Alert, appears well and in no acute distress. HEENT: Atraumatic, conjunctiva clear, no obvious abnormalities on inspection of external nose and ears. NECK: Normal movements of the head and neck. CARDIOPULMONARY: No increased WOB. Speaking in clear sentences. I:E ratio WNL.  MS: Moves all visible extremities without noticeable abnormality. PSYCH: Pleasant and cooperative, well-groomed. Speech normal rate and rhythm. Affect is appropriate. Insight and judgement are appropriate. Attention is focused, linear, and appropriate.  NEURO: CN grossly intact. Oriented as arrived to appointment on time with no prompting. Moves both UE equally.  SKIN: No obvious lesions, wounds, erythema, or cyanosis noted on face or hands.  Assessment and Plan:   Anchor was seen today for covid positive.  Diagnoses and all orders for this visit:  COVID-19 -     Ambulatory referral for Covid Treatment   No red flags on discussion, patient is not in any obvious distress during our visit. Discussed progression of most viral illnesses, and recommended supportive care at this point in time.  He is interested in potential antibody infusion or COVID-19 oral medication if approved, I have sent referral for him to be assessed for this.  Discussed over the counter supportive care options, including Tylenol 500 mg q 8 hours, with recommendations to push fluids and rest. Reviewed return precautions including new/worsening fever, SOB, new/worsening cough, sudden onset changes of symptoms. Recommended need to self-quarantine and practice social distancing until symptoms resolve. I recommend that patient  follow-up if symptoms worsen or persist despite treatment x 7-10 days, sooner if needed.  I discussed the assessment and treatment plan with the patient. The patient was provided an opportunity to ask questions and all were answered. The patient agreed with the plan and demonstrated an understanding of the instructions.   The patient was advised to call back or seek an in-person evaluation if the symptoms worsen or if the condition fails to improve as anticipated.   CMA or LPN served as scribe during this visit. History, Physical, and Plan performed by medical provider. The above documentation has been reviewed and is accurate and complete.  [place], [place] 12/04/2020

## 2020-12-06 ENCOUNTER — Telehealth: Payer: Self-pay

## 2020-12-06 NOTE — Telephone Encounter (Signed)
Unfortunately due to high demand, he may not be eligible. They will reach out to him if he is eligible. I cannot determine where he is at on this list.

## 2020-12-06 NOTE — Telephone Encounter (Signed)
Spoke to pt told him per [facility], Unfortunately due to high demand, he may not be eligible. They will reach out to him if he is eligible. I cannot determine where he is at on this list. Pt verbalized understanding and said I just keep taking my vitamins and stuff. Told pt yes continue medications as prescribed and drinking plenty of fluids. Pt verbalized understanding.

## 2020-12-06 NOTE — Telephone Encounter (Signed)
Pt is checking up on " pills or an infusion" that he says Aldona Bar told him about getting. He states he hasn't heard anything.

## 2020-12-06 NOTE — Telephone Encounter (Signed)
Please see message. °

## 2020-12-06 NOTE — Telephone Encounter (Signed)
Spoke to pt asked how can I help? Pt said he is still not feeling well, has had this for a week, feeling worse today, had a fever. Told pt I can get you scheduled for a Virtual visit tomorrow with a provider. Pt said okay. Told pt I will have the scheduler call him back right away to schedule. Pt verbalized understanding.

## 2020-12-06 NOTE — Telephone Encounter (Signed)
Patient is calling in stating he needed to speak with Aldona Bar about his COVID. Edge would not go into much detail with me, just asked if Aldona Bar or someone could give him a call back.

## 2020-12-07 ENCOUNTER — Other Ambulatory Visit: Payer: Self-pay

## 2020-12-07 ENCOUNTER — Telehealth (INDEPENDENT_AMBULATORY_CARE_PROVIDER_SITE_OTHER): Payer: Medicare Other | Admitting: Internal Medicine

## 2020-12-07 ENCOUNTER — Encounter: Payer: Self-pay | Admitting: Internal Medicine

## 2020-12-07 DIAGNOSIS — U071 COVID-19: Secondary | ICD-10-CM | POA: Diagnosis not present

## 2020-12-07 NOTE — Assessment & Plan Note (Signed)
He has had 8 days of symptoms Didn't qualify for infusion--discussed the limitations No dyspnea ---just mostly fatigue and "brain fog"---but he interacts normally on the call Discussed supportive care Reviewed the delays in recovery and isolation recommendations Recommended a booster vaccine as soon as symptoms  Total time 18 minutes for this visit

## 2020-12-07 NOTE — Progress Notes (Signed)
Subjective:    Patient ID: Mitchell Humphrey, male    DOB: July 17, 1941, 80 y.o.   MRN: [id number]  HPI Telephone virtual visit due to ongoing symptoms from [place] He was unable to do video visit Identification done Reviewed limitations and billing and he gave consent Participants--- patient and wife in his home and I am in my office  Symptoms started 8 days ago Doesn't feel he is getting better Wife notes that he is worse "brain fog" No SOB Low grade fever yesterday Not much cough "I just ain't go no energy and I can't go" Having headache and congestion (head and chest)  Had video visit 1/19--was referred for Rx but never contacted Had vaccine but not booster  Taking ibuprofen, robitussin, mucinex---some help  Current Outpatient Medications on File Prior to Visit  Medication Sig Dispense Refill  . aspirin 81 MG tablet Take 81 mg by mouth daily.    Marland Kitchen atorvastatin (LIPITOR) 20 MG tablet TAKE 1 TABLET DAILY 90 tablet 3  . BLACK ELDERBERRY PO Take 1 capsule by mouth daily.    . cetirizine (ZYRTEC) 10 MG tablet Take 1 tablet (10 mg total) by mouth as needed. 90 tablet 3  . Cholecalciferol (VITAMIN D3) 125 MCG (5000 UT) CAPS Take 1 capsule by mouth daily in the afternoon.    . enalapril (VASOTEC) 10 MG tablet Take 10 mg by mouth daily.    . famotidine (PEPCID) 20 MG tablet Take by mouth.    . finasteride (PROSCAR) 5 MG tablet Take 5 mg by mouth daily.    . fluticasone (FLONASE) 50 MCG/ACT nasal spray Place 2 sprays into both nostrils daily. 16 g 2  . guaiFENesin (ROBITUSSIN) 100 MG/5ML liquid Take by mouth.    . hydrochlorothiazide (HYDRODIURIL) 25 MG tablet Take 25 mg by mouth daily.    Marland Kitchen levothyroxine (SYNTHROID) 150 MCG tablet Take 1 tablet (150 mcg total) by mouth daily before breakfast. 30 tablet 1  . meloxicam (MOBIC) 15 MG tablet Take 1 tablet (15 mg total) by mouth daily. 30 tablet 1  . Multiple Vitamin (MULTIVITAMIN) tablet Take 1 tablet by mouth daily.    Marland Kitchen MYRBETRIQ 25 MG  TB24 tablet Take 25 mg by mouth daily.    . tamsulosin (FLOMAX) 0.4 MG CAPS capsule Take 0.4 mg by mouth daily.    . vitamin A 3 MG (10000 UNITS) capsule Take 10,000 Units by mouth daily.    . vitamin C (ASCORBIC ACID) 500 MG tablet Take 500 mg by mouth daily.     No current facility-administered medications on file prior to visit.    Allergies  Allergen Reactions  . Penicillins     Past Medical History:  Diagnosis Date  . Allergy   . Arthritis   . BPH (benign prostatic hyperplasia)   . Cataract   . Dyslipidemia   . Erectile dysfunction   . GERD (gastroesophageal reflux disease)   . Hx of adenomatous colonic polyps   . Hyperlipidemia   . Hypertension   . Hypogonadism male   . Hypothyroid   . Obesity     Past Surgical History:  Procedure Laterality Date  . COLONOSCOPY  08/2008   Dr. Watt Climes  . HERNIA REPAIR  1970   [facility]    Family History  Problem Relation Age of Onset  . Breast cancer Mother   . Lung cancer Father   . Brain cancer Father   . Breast cancer Sister     Social History   Socioeconomic  History  . Marital status: Married    Spouse name: Not on file  . Number of children: Not on file  . Years of education: Not on file  . Highest education level: Not on file  Occupational History  . Not on file  Tobacco Use  . Smoking status: Never Smoker  . Smokeless tobacco: Never Used  . Tobacco comment: He chews on a cigar  Vaping Use  . Vaping Use: Never used  Substance and Sexual Activity  . Alcohol use: No  . Drug use: No  . Sexual activity: Not Currently  Other Topics Concern  . Not on file  Social History Narrative   Superintendent for construction site   Lives with wife   2 step children   2 sons   Social Determinants of Health   Financial Resource Strain: Not on file  Food Insecurity: Not on file  Transportation Needs: Not on file  Physical Activity: Not on file  Stress: Not on file  Social Connections: Not on file  Intimate Partner  Violence: Not on file   Review of Systems Lost some sense of taste, but not smell No N/V Not eating much---appetite is gone Drinking okay Sleeping okay at night---stable nocturia x 3    Objective:   Physical Exam Constitutional:      Comments: Normal speech and doesn't seem to have any trouble breathing  Neurological:     Mental Status: He is alert.            Assessment & Plan:

## 2020-12-08 ENCOUNTER — Telehealth: Payer: Self-pay | Admitting: Infectious Diseases

## 2020-12-08 NOTE — Telephone Encounter (Signed)
Called to discuss with patient about COVID-19 symptoms and the use of one of the available treatments for those with mild to moderate Covid symptoms and at a high risk of hospitalization.  Pt appears to qualify for outpatient treatment due to co-morbid conditions and/or a member of an at-risk group in accordance with the FDA Emergency Use Authorization.    Symptom onset: 1/15 Vaccinated: yes Booster? no Immunocompromised? no Qualifiers: age, HTN, BMI, un-boosted  Will give post covid clinic information   Mitchell Humphrey

## 2020-12-15 ENCOUNTER — Inpatient Hospital Stay (HOSPITAL_COMMUNITY): Payer: Medicare Other

## 2020-12-15 ENCOUNTER — Encounter (HOSPITAL_COMMUNITY): Payer: Self-pay | Admitting: Emergency Medicine

## 2020-12-15 ENCOUNTER — Other Ambulatory Visit: Payer: Self-pay

## 2020-12-15 ENCOUNTER — Inpatient Hospital Stay (HOSPITAL_COMMUNITY)
Admission: EM | Admit: 2020-12-15 | Discharge: 2020-12-19 | DRG: 177 | Disposition: A | Discharge status: Home / Self Care | Payer: Medicare Other | Source: Ambulatory Visit | Attending: Internal Medicine | Admitting: Internal Medicine

## 2020-12-15 ENCOUNTER — Emergency Department (HOSPITAL_COMMUNITY): Payer: Medicare Other

## 2020-12-15 DIAGNOSIS — R590 Localized enlarged lymph nodes: Secondary | ICD-10-CM | POA: Diagnosis present

## 2020-12-15 DIAGNOSIS — E039 Hypothyroidism, unspecified: Secondary | ICD-10-CM | POA: Diagnosis present

## 2020-12-15 DIAGNOSIS — Z79899 Other long term (current) drug therapy: Secondary | ICD-10-CM

## 2020-12-15 DIAGNOSIS — I82402 Acute embolism and thrombosis of unspecified deep veins of left lower extremity: Secondary | ICD-10-CM | POA: Diagnosis not present

## 2020-12-15 DIAGNOSIS — E119 Type 2 diabetes mellitus without complications: Secondary | ICD-10-CM | POA: Diagnosis present

## 2020-12-15 DIAGNOSIS — E785 Hyperlipidemia, unspecified: Secondary | ICD-10-CM | POA: Diagnosis present

## 2020-12-15 DIAGNOSIS — Z880 Allergy status to penicillin: Secondary | ICD-10-CM | POA: Diagnosis not present

## 2020-12-15 DIAGNOSIS — Z791 Long term (current) use of non-steroidal anti-inflammatories (NSAID): Secondary | ICD-10-CM | POA: Diagnosis not present

## 2020-12-15 DIAGNOSIS — Z79890 Hormone replacement therapy (postmenopausal): Secondary | ICD-10-CM | POA: Diagnosis not present

## 2020-12-15 DIAGNOSIS — J1282 Pneumonia due to coronavirus disease 2019: Secondary | ICD-10-CM | POA: Diagnosis present

## 2020-12-15 DIAGNOSIS — D3501 Benign neoplasm of right adrenal gland: Secondary | ICD-10-CM | POA: Diagnosis present

## 2020-12-15 DIAGNOSIS — Z801 Family history of malignant neoplasm of trachea, bronchus and lung: Secondary | ICD-10-CM

## 2020-12-15 DIAGNOSIS — N400 Enlarged prostate without lower urinary tract symptoms: Secondary | ICD-10-CM | POA: Diagnosis present

## 2020-12-15 DIAGNOSIS — Z86010 Personal history of colonic polyps: Secondary | ICD-10-CM | POA: Diagnosis not present

## 2020-12-15 DIAGNOSIS — I10 Essential (primary) hypertension: Secondary | ICD-10-CM | POA: Diagnosis not present

## 2020-12-15 DIAGNOSIS — Z6837 Body mass index (BMI) 37.0-37.9, adult: Secondary | ICD-10-CM | POA: Diagnosis not present

## 2020-12-15 DIAGNOSIS — K219 Gastro-esophageal reflux disease without esophagitis: Secondary | ICD-10-CM | POA: Diagnosis present

## 2020-12-15 DIAGNOSIS — G9340 Encephalopathy, unspecified: Secondary | ICD-10-CM

## 2020-12-15 DIAGNOSIS — Z803 Family history of malignant neoplasm of breast: Secondary | ICD-10-CM

## 2020-12-15 DIAGNOSIS — Z6835 Body mass index (BMI) 35.0-35.9, adult: Secondary | ICD-10-CM

## 2020-12-15 DIAGNOSIS — Z7982 Long term (current) use of aspirin: Secondary | ICD-10-CM

## 2020-12-15 DIAGNOSIS — I119 Hypertensive heart disease without heart failure: Secondary | ICD-10-CM | POA: Diagnosis present

## 2020-12-15 DIAGNOSIS — U071 COVID-19: Secondary | ICD-10-CM | POA: Diagnosis present

## 2020-12-15 DIAGNOSIS — J9601 Acute respiratory failure with hypoxia: Secondary | ICD-10-CM | POA: Diagnosis present

## 2020-12-15 DIAGNOSIS — E669 Obesity, unspecified: Secondary | ICD-10-CM | POA: Diagnosis present

## 2020-12-15 DIAGNOSIS — Z808 Family history of malignant neoplasm of other organs or systems: Secondary | ICD-10-CM

## 2020-12-15 DIAGNOSIS — R7989 Other specified abnormal findings of blood chemistry: Secondary | ICD-10-CM | POA: Diagnosis not present

## 2020-12-15 DIAGNOSIS — I82462 Acute embolism and thrombosis of left calf muscular vein: Secondary | ICD-10-CM | POA: Diagnosis present

## 2020-12-15 DIAGNOSIS — M1990 Unspecified osteoarthritis, unspecified site: Secondary | ICD-10-CM | POA: Diagnosis present

## 2020-12-15 LAB — CBC
HCT: 39.8 % (ref 39.0–52.0)
Hemoglobin: 12.8 g/dL — ABNORMAL LOW (ref 13.0–17.0)
MCH: 26.4 pg (ref 26.0–34.0)
MCHC: 32.2 g/dL (ref 30.0–36.0)
MCV: 82.2 fL (ref 80.0–100.0)
Platelets: 459 10*3/uL — ABNORMAL HIGH (ref 150–400)
RBC: 4.84 MIL/uL (ref 4.22–5.81)
RDW: 13.8 % (ref 11.5–15.5)
WBC: 10.3 10*3/uL (ref 4.0–10.5)
nRBC: 0 % (ref 0.0–0.2)

## 2020-12-15 LAB — PROCALCITONIN: Procalcitonin: <0.1 ng/mL

## 2020-12-15 LAB — TRIGLYCERIDES: Triglycerides: 121 mg/dL (ref <150)

## 2020-12-15 LAB — BASIC METABOLIC PANEL
Anion gap: 11 (ref 5–15)
BUN: 18 mg/dL (ref 8–23)
CO2: 23 mmol/L (ref 22–32)
Calcium: 8.6 mg/dL — ABNORMAL LOW (ref 8.9–10.3)
Chloride: 100 mmol/L (ref 98–111)
Creatinine, Ser: 0.88 mg/dL (ref 0.61–1.24)
GFR, Estimated: >60 mL/min (ref >60)
Glucose, Bld: 153 mg/dL — ABNORMAL HIGH (ref 70–99)
Potassium: 4 mmol/L (ref 3.5–5.1)
Sodium: 134 mmol/L — ABNORMAL LOW (ref 135–145)

## 2020-12-15 LAB — D-DIMER, QUANTITATIVE: D-Dimer, Quant: 17.73 ug/mL-FEU — ABNORMAL HIGH (ref 0.00–0.50)

## 2020-12-15 LAB — FIBRINOGEN: Fibrinogen: 741 mg/dL — ABNORMAL HIGH (ref 210–475)

## 2020-12-15 LAB — LACTATE DEHYDROGENASE: LDH: 266 U/L — ABNORMAL HIGH (ref 98–192)

## 2020-12-15 LAB — FERRITIN: Ferritin: 610 ng/mL — ABNORMAL HIGH (ref 24–336)

## 2020-12-15 LAB — SARS CORONAVIRUS 2 BY RT PCR (HOSPITAL ORDER, PERFORMED IN ~~LOC~~ HOSPITAL LAB): SARS Coronavirus 2: POSITIVE — AB

## 2020-12-15 LAB — C-REACTIVE PROTEIN: CRP: 19.6 mg/dL — ABNORMAL HIGH (ref <1.0)

## 2020-12-15 LAB — LACTIC ACID, PLASMA: Lactic Acid, Venous: 1.9 mmol/L (ref 0.5–1.9)

## 2020-12-15 LAB — TROPONIN I (HIGH SENSITIVITY)
Troponin I (High Sensitivity): 11 ng/L (ref <18)
Troponin I (High Sensitivity): 11 ng/L (ref <18)

## 2020-12-15 MED ORDER — TAMSULOSIN HCL 0.4 MG PO CAPS
0.4000 mg | ORAL_CAPSULE | Freq: Every day | ORAL | Status: DC
  Administered 2020-12-15 – 2020-12-19 (×5): 0.4 mg via ORAL
  Filled 2020-12-15 (×5): qty 1

## 2020-12-15 MED ORDER — SODIUM CHLORIDE 0.9 % IV SOLN
100.0000 mg | Freq: Every day | INTRAVENOUS | Status: AC
  Administered 2020-12-16 – 2020-12-19 (×4): 100 mg via INTRAVENOUS
  Filled 2020-12-15 (×3): qty 20
  Filled 2020-12-15: qty 100

## 2020-12-15 MED ORDER — PREDNISONE 20 MG PO TABS: 50.0000 mg | ORAL_TABLET | Freq: Every day | ORAL | Status: DC

## 2020-12-15 MED ORDER — HYDROCOD POLST-CPM POLST ER 10-8 MG/5ML PO SUER: 5.0000 mL | Freq: Two times a day (BID) | ORAL | Status: DC | PRN

## 2020-12-15 MED ORDER — LEVOTHYROXINE SODIUM 75 MCG PO TABS
150.0000 ug | ORAL_TABLET | Freq: Every day | ORAL | Status: DC
  Administered 2020-12-16 – 2020-12-19 (×4): 150 ug via ORAL
  Filled 2020-12-15 (×4): qty 2

## 2020-12-15 MED ORDER — IOHEXOL 350 MG/ML SOLN
75.0000 mL | Freq: Once | INTRAVENOUS | Status: AC | PRN
  Administered 2020-12-15: 75 mL via INTRAVENOUS

## 2020-12-15 MED ORDER — ASPIRIN EC 81 MG PO TBEC
81.0000 mg | DELAYED_RELEASE_TABLET | Freq: Every day | ORAL | Status: DC
  Administered 2020-12-16 – 2020-12-17 (×2): 81 mg via ORAL
  Filled 2020-12-15 (×2): qty 1

## 2020-12-15 MED ORDER — SODIUM CHLORIDE 0.9 % IV SOLN
200.0000 mg | Freq: Once | INTRAVENOUS | Status: AC
  Administered 2020-12-15: 200 mg via INTRAVENOUS
  Filled 2020-12-15: qty 40

## 2020-12-15 MED ORDER — ATORVASTATIN CALCIUM 10 MG PO TABS
20.0000 mg | ORAL_TABLET | Freq: Every day | ORAL | Status: DC
  Administered 2020-12-15 – 2020-12-18 (×4): 20 mg via ORAL
  Filled 2020-12-15 (×4): qty 2

## 2020-12-15 MED ORDER — FINASTERIDE 5 MG PO TABS
5.0000 mg | ORAL_TABLET | Freq: Every day | ORAL | Status: DC
Start: 2020-12-15 — End: 2020-12-19
  Administered 2020-12-15 – 2020-12-19 (×5): 5 mg via ORAL
  Filled 2020-12-15 (×5): qty 1

## 2020-12-15 MED ORDER — ACETAMINOPHEN 325 MG PO TABS
650.0000 mg | ORAL_TABLET | Freq: Once | ORAL | Status: AC
  Administered 2020-12-15: 650 mg via ORAL
  Filled 2020-12-15: qty 2

## 2020-12-15 MED ORDER — MIRABEGRON ER 25 MG PO TB24
25.0000 mg | ORAL_TABLET | Freq: Every day | ORAL | Status: DC
  Administered 2020-12-15 – 2020-12-19 (×5): 25 mg via ORAL
  Filled 2020-12-15 (×5): qty 1

## 2020-12-15 MED ORDER — SODIUM CHLORIDE 0.9 % IV SOLN
250.0000 mL | INTRAVENOUS | Status: DC | PRN
  Administered 2020-12-19: 250 mL via INTRAVENOUS

## 2020-12-15 MED ORDER — SODIUM CHLORIDE 0.9% FLUSH
3.0000 mL | Freq: Two times a day (BID) | INTRAVENOUS | Status: DC
  Administered 2020-12-15: 3 mL via INTRAVENOUS

## 2020-12-15 MED ORDER — FLEET ENEMA 7-19 GM/118ML RE ENEM: 1.0000 | ENEMA | Freq: Once | RECTAL | Status: DC | PRN

## 2020-12-15 MED ORDER — GUAIFENESIN-DM 100-10 MG/5ML PO SYRP: 10.0000 mL | ORAL_SOLUTION | ORAL | Status: DC | PRN

## 2020-12-15 MED ORDER — SODIUM CHLORIDE 0.9% FLUSH
3.0000 mL | Freq: Two times a day (BID) | INTRAVENOUS | Status: DC
  Administered 2020-12-15 – 2020-12-19 (×9): 3 mL via INTRAVENOUS

## 2020-12-15 MED ORDER — METHYLPREDNISOLONE SODIUM SUCC 125 MG IJ SOLR
0.5000 mg/kg | Freq: Two times a day (BID) | INTRAMUSCULAR | Status: DC
Start: 2020-12-15 — End: 2020-12-16
  Administered 2020-12-15: 55.625 mg via INTRAVENOUS
  Filled 2020-12-15: qty 2

## 2020-12-15 MED ORDER — ENALAPRIL MALEATE 5 MG PO TABS
10.0000 mg | ORAL_TABLET | Freq: Every day | ORAL | Status: DC
  Administered 2020-12-16 – 2020-12-19 (×4): 10 mg via ORAL
  Filled 2020-12-15 (×5): qty 2

## 2020-12-15 MED ORDER — ACETAMINOPHEN 325 MG PO TABS: 650.0000 mg | ORAL_TABLET | Freq: Four times a day (QID) | ORAL | Status: DC | PRN

## 2020-12-15 MED ORDER — ASCORBIC ACID 500 MG PO TABS
500.0000 mg | ORAL_TABLET | Freq: Every day | ORAL | Status: DC
  Administered 2020-12-15 – 2020-12-19 (×5): 500 mg via ORAL
  Filled 2020-12-15 (×5): qty 1

## 2020-12-15 MED ORDER — HYDROCHLOROTHIAZIDE 25 MG PO TABS
25.0000 mg | ORAL_TABLET | Freq: Every day | ORAL | Status: DC
  Administered 2020-12-15 – 2020-12-19 (×5): 25 mg via ORAL
  Filled 2020-12-15 (×5): qty 1

## 2020-12-15 MED ORDER — ONDANSETRON HCL 4 MG PO TABS: 4.0000 mg | ORAL_TABLET | Freq: Four times a day (QID) | ORAL | Status: DC | PRN

## 2020-12-15 MED ORDER — ALBUTEROL SULFATE HFA 108 (90 BASE) MCG/ACT IN AERS: 2.0000 | INHALATION_SPRAY | RESPIRATORY_TRACT | Status: DC | PRN

## 2020-12-15 MED ORDER — SODIUM CHLORIDE 0.9% FLUSH: 3.0000 mL | INTRAVENOUS | Status: DC | PRN

## 2020-12-15 MED ORDER — LORATADINE 10 MG PO TABS
10.0000 mg | ORAL_TABLET | Freq: Every day | ORAL | Status: DC
  Administered 2020-12-15 – 2020-12-19 (×5): 10 mg via ORAL
  Filled 2020-12-15 (×5): qty 1

## 2020-12-15 MED ORDER — POLYETHYLENE GLYCOL 3350 17 G PO PACK: 17.0000 g | PACK | Freq: Every day | ORAL | Status: DC | PRN

## 2020-12-15 MED ORDER — ALBUTEROL SULFATE HFA 108 (90 BASE) MCG/ACT IN AERS
2.0000 | INHALATION_SPRAY | RESPIRATORY_TRACT | Status: DC | PRN
  Administered 2020-12-19: 2 via RESPIRATORY_TRACT
  Filled 2020-12-15: qty 6.7

## 2020-12-15 MED ORDER — DOCUSATE SODIUM 100 MG PO CAPS
100.0000 mg | ORAL_CAPSULE | Freq: Two times a day (BID) | ORAL | Status: DC
  Administered 2020-12-16 – 2020-12-19 (×7): 100 mg via ORAL
  Filled 2020-12-15 (×7): qty 1

## 2020-12-15 MED ORDER — ZINC SULFATE 220 (50 ZN) MG PO CAPS
220.0000 mg | ORAL_CAPSULE | Freq: Every day | ORAL | Status: DC
  Administered 2020-12-15 – 2020-12-19 (×5): 220 mg via ORAL
  Filled 2020-12-15 (×5): qty 1

## 2020-12-15 MED ORDER — FAMOTIDINE 20 MG PO TABS
20.0000 mg | ORAL_TABLET | Freq: Every day | ORAL | Status: DC
  Administered 2020-12-15 – 2020-12-19 (×5): 20 mg via ORAL
  Filled 2020-12-15 (×5): qty 1

## 2020-12-15 MED ORDER — ONDANSETRON HCL 4 MG/2ML IJ SOLN: 4.0000 mg | Freq: Four times a day (QID) | INTRAMUSCULAR | Status: DC | PRN

## 2020-12-15 MED ORDER — ENOXAPARIN SODIUM 60 MG/0.6ML ~~LOC~~ SOLN
50.0000 mg | SUBCUTANEOUS | Status: DC
  Administered 2020-12-15: 50 mg via SUBCUTANEOUS
  Filled 2020-12-15: qty 0.5

## 2020-12-15 MED ORDER — OXYCODONE HCL 5 MG PO TABS: 5.0000 mg | ORAL_TABLET | ORAL | Status: DC | PRN

## 2020-12-15 MED ORDER — DEXAMETHASONE SODIUM PHOSPHATE 10 MG/ML IJ SOLN
6.0000 mg | Freq: Once | INTRAMUSCULAR | Status: AC
  Administered 2020-12-15: 6 mg via INTRAVENOUS
  Filled 2020-12-15: qty 1

## 2020-12-15 MED ORDER — FLUTICASONE PROPIONATE 50 MCG/ACT NA SUSP
2.0000 | Freq: Every day | NASAL | Status: DC
  Administered 2020-12-16 – 2020-12-19 (×4): 2 via NASAL
  Filled 2020-12-15: qty 16

## 2020-12-15 MED ORDER — SODIUM CHLORIDE 0.9 % IV SOLN: 250.0000 mL | INTRAVENOUS | Status: DC | PRN

## 2020-12-15 MED ORDER — BISACODYL 5 MG PO TBEC: 5.0000 mg | DELAYED_RELEASE_TABLET | Freq: Every day | ORAL | Status: DC | PRN

## 2020-12-15 NOTE — ED Notes (Signed)
Transported to CT 

## 2020-12-15 NOTE — ED Provider Notes (Addendum)
[facility] EMERGENCY DEPARTMENT Provider Note   CSN: [id number] Arrival date & time: 12/15/20  0944     History Chief Complaint  Patient presents with  . Covid Positive  . Shortness of Breath    Mitchell Humphrey is a 80 y.o. male.  HPI    80 year old male comes in with chief complaint of shortness of breath. Patient started developing symptoms of weakness about 2 weeks ago and was diagnosed with COVID-19 at least a week ago.  He comes into the ER today because of worsening shortness of breath, confusion.  Patient reports that his main complaints are feeling foggy and having body aches all over.  He denies any new cough, chest pain and does not really feel short of breath.  He has received 2 shots of COVID-19 vaccine.  Patient has history of dyslipidemia, diabetes.  He had gone to an urgent care and was noted to have hypoxia.   Patient reports having fevers, but it they are not new.  He denies any new cough or chest pain.  Past Medical History:  Diagnosis Date  . Allergy   . Arthritis   . BPH (benign prostatic hyperplasia)   . Cataract   . Dyslipidemia   . Erectile dysfunction   . GERD (gastroesophageal reflux disease)   . Hx of adenomatous colonic polyps   . Hyperlipidemia   . Hypertension   . Hypogonadism male   . Hypothyroid   . Obesity     Patient Active Problem List   Diagnosis Date Noted  . COVID-19 virus infection 12/07/2020  . Insulin resistance 10/23/2020  . Severe obesity (BMI 35.0-35.9 with comorbidity) ([place]) 08/05/2020  . 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 GERD (gastroesophageal reflux disease) 12/20/2018  . Chronic low back pain 05/18/2018  . Hypercholesterolemia 08/09/2017  . BPH (benign prostatic hyperplasia) 08/09/2017  . Osteoarthritis of both hands 08/09/2017  . 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 History of melanoma 02/04/2015  . Elevated prostate specific antigen (PSA) 03/15/2013  . Increased frequency  of urination 03/15/2013  . Urge incontinence 03/15/2013  . Benign essential hypertension 05/25/2012  . Hypothyroidism (acquired) 05/25/2012  . Dyslipidemia 05/25/2012  . 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 COLONOSCOPY  08/2008   Dr. Watt Climes  . HERNIA REPAIR  1970   [facility]       Family History  Problem Relation Age of Onset  . Breast cancer Mother   . Lung cancer Father   . Brain cancer Father   . Breast cancer Sister     Social History   Tobacco Use  . Smoking status: Never Smoker  . Smokeless tobacco: Never Used  . Tobacco comment: He chews on a cigar  Vaping Use  . Vaping Use: Never used  Substance Use Topics  . Alcohol use: No  . Drug use: No    Home Medications Prior to Admission medications   Medication Sig Start Date End Date Taking? Authorizing Provider  aspirin 81 MG tablet Take 81 mg by mouth daily.    [provider]  atorvastatin (LIPITOR) 20 MG tablet TAKE 1 TABLET DAILY 07/03/13   Tysinger, Camelia Eng, PA-C  BLACK ELDERBERRY PO Take 1 capsule by mouth daily.    [provider]  cetirizine (ZYRTEC) 10 MG tablet Take 1 tablet (10 mg total) by mouth as needed. 05/25/12   Tysinger, Camelia Eng, PA-C  Cholecalciferol (VITAMIN D3) 125 MCG (5000 UT) CAPS Take  1 capsule by mouth daily in the afternoon.    [provider]  enalapril (VASOTEC) 10 MG tablet Take 10 mg by mouth daily. 10/18/20   [provider]  famotidine (PEPCID) 20 MG tablet Take by mouth. 12/20/18   [provider]  finasteride (PROSCAR) 5 MG tablet Take 5 mg by mouth daily. 09/23/20   [provider]  fluticasone (FLONASE) 50 MCG/ACT nasal spray Place 2 sprays into both nostrils daily. 10/23/20   Inda Coke, PA  guaiFENesin (ROBITUSSIN) 100 MG/5ML liquid Take by mouth.    [provider]  hydrochlorothiazide (HYDRODIURIL) 25 MG tablet Take 25 mg by mouth daily. 09/23/20   [provider]  levothyroxine (SYNTHROID) 150 MCG tablet Take 1 tablet (150 mcg total) by mouth daily before breakfast. 11/01/20   Inda Coke, PA  meloxicam (MOBIC) 15 MG tablet Take 1 tablet (15 mg total) by mouth daily. 10/23/20   Inda Coke, PA  Multiple Vitamin (MULTIVITAMIN) tablet Take 1 tablet by mouth daily.    [provider]  MYRBETRIQ 25 MG TB24 tablet Take 25 mg by mouth daily. 09/23/20   [provider]  tamsulosin (FLOMAX) 0.4 MG CAPS capsule Take 0.4 mg by mouth daily. 10/18/20   [provider]  vitamin A 3 MG (10000 UNITS) capsule Take 10,000 Units by mouth daily.    [provider]  vitamin C (ASCORBIC ACID) 500 MG tablet Take 500 mg by mouth daily.    [provider]    Allergies    Penicillins  Review of Systems   Review of Systems  Constitutional: Positive for activity change.  Respiratory: Positive for shortness of breath. Negative for cough.   Cardiovascular: Negative for chest pain.  Gastrointestinal: Negative for nausea and vomiting.  Musculoskeletal: Positive for arthralgias and myalgias.  Hematological: Does not bruise/bleed easily.  All other systems reviewed and are negative.   Physical Exam Updated Vital Signs BP 135/83   Pulse 97   Temp 99.5 F (37.5 C) (Oral)   Resp (!) 21   SpO2 94%   Physical Exam Vitals and nursing note reviewed.  Constitutional:      Appearance: He is well-developed.  HENT:     Head: Atraumatic.  Cardiovascular:     Rate and Rhythm: Normal rate.  Pulmonary:     Effort: Pulmonary effort is normal. Tachypnea present.     Breath sounds: Examination of the left-upper field reveals rales. Examination of the right-middle field reveals rales. Examination of the left-middle field reveals rales. Examination of the right-lower field reveals rales. Examination of the left-lower field reveals rales. Rales present.     Comments: Left-sided rales -more pronounced than right Musculoskeletal:      Cervical back: Neck supple.  Skin:    General: Skin is warm.  Neurological:     Mental Status: He is alert and oriented to person, place, and time.     ED Results / Procedures / Treatments   Labs (all labs ordered are listed, but only abnormal results are displayed) Labs Reviewed  BASIC METABOLIC PANEL - Abnormal; Notable for the following components:      Result Value   Sodium 134 (*)    Glucose, Bld 153 (*)    Calcium 8.6 (*)    All other components within normal limits  CBC - Abnormal; Notable for the following components:   Hemoglobin 12.8 (*)    Platelets 459 (*)    All other components within normal limits  LACTIC  ACID, PLASMA  LACTIC ACID, PLASMA  D-DIMER, QUANTITATIVE (NOT AT [facility])  PROCALCITONIN  LACTATE DEHYDROGENASE  FERRITIN  TRIGLYCERIDES  FIBRINOGEN  C-REACTIVE PROTEIN  TROPONIN I (HIGH SENSITIVITY)  TROPONIN I (HIGH SENSITIVITY)    EKG EKG Interpretation  Date/Time:  "Sunday December 15 2020 09:49:43 EST Ventricular Rate:  103 PR Interval:    QRS Duration: 90 QT Interval:  342 QTC Calculation: 448 R Axis:   17 Text Interpretation: Sinus tachycardia with Premature atrial complexes Minimal voltage criteria for LVH, may be normal variant ( R in aVL ) Borderline ECG No acute changes [id number] is noted Confirmed by Leiyah Maultsby ([id number]) on 12/15/2020 12:39:11 PM   Radiology DG Chest Portable 1 View  Result Date: 12/15/2020 CLINICAL DATA:  Shortness of breath EXAM: PORTABLE CHEST 1 VIEW COMPARISON:  Chest x-rays dated 12/07/2020 and 04/10/2008 FINDINGS: Worsening bilateral airspace opacities, LEFT slightly greater than RIGHT. No pleural effusion or pneumothorax is seen. Heart size and mediastinal contours are stable. IMPRESSION: Worsening bilateral multifocal pneumonia. Electronically Signed   By: Stan  Maynard M.D.   On: 12/15/2020 10:36    Procedures .Critical Care Performed by: Purl Claytor, MD Authorized by: Lexii Walsh, MD   Critical  care provider statement:    Critical care time (minutes):  52   Critical care was necessary to treat or prevent imminent or life-threatening deterioration of the following conditions:  Circulatory failure and respiratory failure   Critical care was time spent personally by me on the following activities:  Discussions with consultants, evaluation of patient's response to treatment, examination of patient, ordering and performing treatments and interventions, ordering and review of laboratory studies, ordering and review of radiographic studies, pulse oximetry, re-evaluation of patient's condition, obtaining history from patient or surrogate and review of old charts     Medications Ordered in ED Medications  dexamethasone (DECADRON) injection 6 mg (has no administration in time range)  sodium chloride flush (NS) 0.9 % injection 3 mL (has no administration in time range)  sodium chloride flush (NS) 0.9 % injection 3 mL (has no administration in time range)  0.9 %  sodium chloride infusion (has no administration in time range)  acetaminophen (TYLENOL) tablet 650 mg (has no administration in time range)  albuterol (VENTOLIN HFA) 108 (90 Base) MCG/ACT inhaler 2 puff (has no administration in time range)  remdesivir 200 mg in sodium chloride 0.9% 250 mL IVPB (has no administration in time range)    Followed by  remdesivir 100 mg in sodium chloride 0.9 % 100 mL IVPB (has no administration in time range)    ED Course  I have reviewed the triage vital signs and the nursing notes.  Pertinent labs & imaging results that were available during my care of the patient were reviewed by me and considered in my medical decision making (see chart for details).    MDM Rules/Calculators/A&P                          79"  year old male comes in a chief complaint of shortness of breath, confusion, weakness, myalgias and fever.  It appears that he has been symptomatic with COVID-19 symptoms for 2 weeks and  diagnosed at least a week ago.  He has not received any infusions.  He has received 2 vaccine shots against COVID-19.  On exam, patient does not appear to be in respiratory distress but is tachypneic with left-sided rales.  He is on 3 L of oxygen.,  No  signs of DVT.  Differential diagnosis includes worsening COVID-19, PE, superimposed bacterial infection.  X-rays reviewed and he has evidence of multifocal pneumonia.  I have considered superimposed bacterial pneumonia, however the x-ray is more consistent with what we typically see with COVID-19.  I will not initiate antibiotics right now, but we will have this discussion with the admitting staff.   Reassessment: Dr. Lorin Mercy will follow up on the D-dimer.  If the D-dimer is significantly elevated she will proceed with CT PE.  Final Clinical Impression(s) / ED Diagnoses Final diagnoses:  Acute respiratory failure with hypoxia ([place])  Acute encephalopathy    Rx / DC Orders ED Discharge Orders    None       Varney Biles, MD 12/15/20 [place], Gilberta Peeters, MD 12/15/20 1409

## 2020-12-15 NOTE — H&P (Signed)
History and Physical    Mitchell Humphrey [id number] DOB: 08-12-1941 DOA: 12/15/2020  PCP: Inda Coke, PA Consultants:  None Patient coming from:  Home - lives with wife; NOK: Wife, Amedeo Soriano, [phone]  Chief Complaint: Worsening COVID symptoms  HPI: Mitchell Humphrey is a 80 y.o. male with medical history significant of obesity; hypothyroidism; HTN; HLD; and BPH presenting with worsening COVID symptoms. Phone calls documented in [place].  On 1/18, he had rhinorrhea and did a home COVID test which was positive.  He had a PCP visit on 1/19 without apparent in-office COVID testing but with referral for Mab treatment; unfortunately, due to high demand he was not thought to be eligible.  He was seen on a virtual visit and then in the ER on 1/22 for AMS, headache, weakness, cough, and nasal congestion.  Fever to 103.  Negative CXR, O2 96%.  He was recommended for supportive care; I do not see evidence of COVID testing. ID documented a phone call on 1/23 and patient was thought to qualify for outpatient treatment but there does not appear to have been f/u since.  He reports that he has had waxing and waning symptoms with severe fatigue, myalgias (now concentrated in his forearms), and increasing SOB.  He felt like he had to do something because his symptoms just wouldn't go away.  He went to urgent care this morning (did get swabbed there) and eventually was sent to the ER.   ED Course:  COVID with hypoxia.  Diagnosed on 1/18.  CXR with multifocal PNA.  Consider CTA if concern for PE.  sats as low as 82% at Urgent Care, started on 4L with improvement.  Review of Systems: As per HPI; otherwise review of systems reviewed and negative.   Ambulatory Status:  Ambulates without assistance  COVID Vaccine Status:  Complete, no booster  Past Medical History:  Diagnosis Date  . Allergy   . Arthritis   . BPH (benign prostatic hyperplasia)   . Cataract   . Dyslipidemia   . Erectile dysfunction   .  GERD (gastroesophageal reflux disease)   . Hx of adenomatous colonic polyps   . Hyperlipidemia   . Hypertension   . Hypogonadism male   . Hypothyroid   . Obesity     Past Surgical History:  Procedure Laterality Date  . COLONOSCOPY  08/2008   Dr. Watt Climes  . HERNIA REPAIR  1970   [facility]    Social History   Socioeconomic History  . Marital status: Married    Spouse name: Not on file  . Number of children: Not on file  . Years of education: Not on file  . Highest education level: Not on file  Occupational History  . Not on file  Tobacco Use  . Smoking status: Never Smoker  . Smokeless tobacco: Never Used  . Tobacco comment: He chews on a cigar  Vaping Use  . Vaping Use: Never used  Substance and Sexual Activity  . Alcohol use: No  . Drug use: No  . Sexual activity: Not Currently  Other Topics Concern  . Not on file  Social History Narrative   Superintendent for construction site   Lives with wife   2 step children   2 sons   Social Determinants of Health   Financial Resource Strain: Not on file  Food Insecurity: Not on file  Transportation Needs: Not on file  Physical Activity: Not on file  Stress: Not on file  Social Connections: Not  on file  Intimate Partner Violence: Not on file    Allergies  Allergen Reactions  . Penicillins     Family History  Problem Relation Age of Onset  . Breast cancer Mother   . Lung cancer Father   . Brain cancer Father   . Breast cancer Sister     Prior to Admission medications   Medication Sig Start Date End Date Taking? Authorizing Provider  aspirin 81 MG tablet Take 81 mg by mouth daily.    [provider]  atorvastatin (LIPITOR) 20 MG tablet TAKE 1 TABLET DAILY 07/03/13   Tysinger, Camelia Eng, PA-C  BLACK ELDERBERRY PO Take 1 capsule by mouth daily.    [provider]  cetirizine (ZYRTEC) 10 MG tablet Take 1 tablet (10 mg total) by mouth as needed. 05/25/12   Tysinger, Camelia Eng, PA-C  Cholecalciferol  (VITAMIN D3) 125 MCG (5000 UT) CAPS Take 1 capsule by mouth daily in the afternoon.    [provider]  enalapril (VASOTEC) 10 MG tablet Take 10 mg by mouth daily. 10/18/20   [provider]  famotidine (PEPCID) 20 MG tablet Take by mouth. 12/20/18   [provider]  finasteride (PROSCAR) 5 MG tablet Take 5 mg by mouth daily. 09/23/20   [provider]  fluticasone (FLONASE) 50 MCG/ACT nasal spray Place 2 sprays into both nostrils daily. 10/23/20   Inda Coke, PA  guaiFENesin (ROBITUSSIN) 100 MG/5ML liquid Take by mouth.    [provider]  hydrochlorothiazide (HYDRODIURIL) 25 MG tablet Take 25 mg by mouth daily. 09/23/20   [provider]  levothyroxine (SYNTHROID) 150 MCG tablet Take 1 tablet (150 mcg total) by mouth daily before breakfast. 11/01/20   Inda Coke, PA  meloxicam (MOBIC) 15 MG tablet Take 1 tablet (15 mg total) by mouth daily. 10/23/20   Inda Coke, PA  Multiple Vitamin (MULTIVITAMIN) tablet Take 1 tablet by mouth daily.    [provider]  MYRBETRIQ 25 MG TB24 tablet Take 25 mg by mouth daily. 09/23/20   [provider]  tamsulosin (FLOMAX) 0.4 MG CAPS capsule Take 0.4 mg by mouth daily. 10/18/20   [provider]  vitamin A 3 MG (10000 UNITS) capsule Take 10,000 Units by mouth daily.    [provider]  vitamin C (ASCORBIC ACID) 500 MG tablet Take 500 mg by mouth daily.    [provider]    Physical Exam: Vitals:   12/15/20 0952 12/15/20 1200  BP: (!) 142/78 135/83  Pulse: (!) 102 97  Resp: (!) 22 (!) 21  Temp: 99.5 F (37.5 C)   TempSrc: Oral   SpO2: 92% 94%     . General:  Appears calm and comfortable and is in NAD, on 3L [place] O2 . Eyes:  PERRL, EOMI, normal lids, iris . ENT:  grossly normal hearing, lips & tongue, mmm; appropriate dentition . Neck:  no LAD, masses or thyromegaly . Cardiovascular:  RRR, no m/r/g. No LE edema.  Marland Kitchen Respiratory:   Scattered  rhonchi.  Normal to mildly increased respiratory effort on [place] O2. . Abdomen:  soft, NT, ND, NABS . Back:   normal alignment . Skin:  no rash or induration seen on limited exam . Musculoskeletal:  grossly normal tone BUE/BLE, good ROM, no bony abnormality . Lower extremity:  No LE edema.  2+ distal pulses. Marland Kitchen Psychiatric:  grossly normal mood and affect, speech fluent and appropriate, AOx3 . Neurologic:  CN 2-12 grossly intact, moves all extremities  in coordinated fashion    Radiological Exams on Admission: Independently reviewed - see discussion in A/P where applicable  DG Chest Portable 1 View  Result Date: 12/15/2020 CLINICAL DATA:  Shortness of breath EXAM: PORTABLE CHEST 1 VIEW COMPARISON:  Chest x-rays dated 12/07/2020 and 04/10/2008 FINDINGS: Worsening bilateral airspace opacities, LEFT slightly greater than RIGHT. No pleural effusion or pneumothorax is seen. Heart size and mediastinal contours are stable. IMPRESSION: Worsening bilateral multifocal pneumonia. Electronically Signed   By: Franki Cabot M.D.   On: 12/15/2020 10:36    EKG: Independently reviewed.  Sinus tachycardia with rate 103; no evidence of acute ischemia   Labs on Admission: I have personally reviewed the available labs and imaging studies at the time of the admission.  Pertinent labs:   Glucose 153 HS troponin 11, 11 WBC 10.3 Hgb 12.8 Platelets 459 LDH 266 Lactate 1.9 D-dimer 17.73 Fibrinogen 741 Ferritin 610 CRP 19.6   Assessment/Plan Principal Problem:   Acute hypoxemic respiratory failure due to COVID-19 [facility]) Active Problems:   Benign essential hypertension   Hypothyroidism (acquired)   Dyslipidemia   Severe obesity (BMI 35.0-35.9 with comorbidity) (HCC)    Acute respiratory failure with hypoxia due to COVID-19 PNA -Patient with presenting with SOB and persistent fatigue -Mild anorexia noted without the presence of other GI symptoms - reports he has lost 40 pounds in the last 2 weeks -He  does not have a usual home O2 requirement and is currently requiring 3L [place] O2 -COVID POSITIVE but without current documentation - will need repeat testing here if unable to obtain documentation of positive test from UC today -The patient has comorbidities which may increase the risk for ARDS/MODS including: age, HTN, obesity -Pertinent labs concerning for COVID include normal WBC count; increased LDH; markedly elevated D-dimer (>>>1); increased ferritin; markedly elevated CRP (>>7); increased fibrinogen -CXR with multifocal opacities which may be c/w COVID vs. Multifocal PNA -Given severely elevated D-dimer, I have ordered a STAT CTA and communicated with RN to send patient to CT ASAP -Will not treat with broad-spectrum antibiotics if procalcitonin <0.5 -Will admit for further evaluation, close monitoring, and treatment -Monitor on telemetry x at least 24 hours -At this time, will attempt to avoid use of aerosolized medications and use HFAs instead -Will check daily labs including BMP with Mag, Phos; LFTs; CBC with differential; CRP; ferritin; fibrinogen; D-dimer -Will order steroids and Remdesivir (pharmacy consult) given +COVID test, +CXR, and hypoxia <94% on room air -If the patient shows clinical deterioration, consider transfer to ICU with PCCM consultation -Consider IL-6 agonist (Actemra) and/or JAK inhibitor (baricitinib) if the patient does not stabilize on current treatment or if the patient has marked clinical decompensation; the patient does not appear to require this treatment at this time but has been consented and agrees to receive treatment if there is evidence of clinical worsening despite current treatments. -Will attempt to maintain euvolemia to a net negative fluid status -Will ask the patient to maintain an awake prone position as much as possible -PT/OT consults -Encourage mobilization/ambulation as much as possible -Patient was seen wearing full PPE including: gown, gloves,  head cover, N95, and face shield; donning and doffing was in compliance with current standards.  HTN -Continue HCTZ, enalapril -Continue ASA  HLD -Continue Lipitor  Hypothyroidism -Check TSH - it was elevated on 12/8 -Continue Synthroid at current dose for now  BPH -Continue Proscar, Myrbetriq, Flomax  Obesity -BMI is 37.2 -Weight loss should be encouraged -Outpatient PCP/bariatric medicine f/u encouraged  Level of care: Telemetry Medical DVT prophylaxis:  Lovenox  Code Status:  FULL - confirmed with patient Family Communication: None present; I spoke with the patient's wife by telephone. Disposition Plan:  The patient is from: home  Anticipated d/c is to: home without [facility] services once his respiratory issues have been resolved.  He may require home O2 at the time of discharge.  Anticipated d/c date will depend on clinical response to treatment, likely between 3 days (with completion of outpatient Remdesivir treatment) and 5 days  Patient is currently: acutely ill Consults called: PT/OT  Admission status: Admit - It is my clinical opinion that admission to [place] is reasonable and necessary because of the expectation that this patient will require hospital care that crosses at least 2 midnights to treat this condition based on the medical complexity of the problems presented.  Given the aforementioned information, the predictability of an adverse outcome is felt to be significant.     Karmen Bongo MD [facility]   How to contact the [facility] Attending or Consulting provider [place] or covering provider during after hours [place], for this patient?  1. Check the care team in [facility] and look for a) attending/consulting [facility] provider listed and b) the [facility] team listed 2. Log into [email or web address] and use [place]'s universal password to access. If you do not have the password, please contact the hospital operator. 3. Locate the [facility] provider you are looking for under [facility] and page to a number that you can be directly reached. 4. If you still have difficulty reaching the provider, please page the [facility] (Director on Call) for the Hospitalists listed on amion for assistance.   12/15/2020, 1:41 PM

## 2020-12-15 NOTE — ED Triage Notes (Signed)
Pt to triage via [facility] from and Urgent [place].  Reports COVID +.  Seen for SOB and initial sats 82% on room air.  94% on 4 liters.  Pt reports bilateral wrist pain/body aches.  Chest x-ray showed COVID pneumonia.  Pt received Rocephin 1 gram at UC.

## 2020-12-16 ENCOUNTER — Other Ambulatory Visit: Payer: Self-pay | Admitting: *Deleted

## 2020-12-16 ENCOUNTER — Inpatient Hospital Stay (HOSPITAL_COMMUNITY): Payer: Medicare Other

## 2020-12-16 ENCOUNTER — Telehealth: Payer: Self-pay

## 2020-12-16 DIAGNOSIS — I10 Essential (primary) hypertension: Secondary | ICD-10-CM

## 2020-12-16 DIAGNOSIS — U071 COVID-19: Secondary | ICD-10-CM

## 2020-12-16 DIAGNOSIS — R7989 Other specified abnormal findings of blood chemistry: Secondary | ICD-10-CM

## 2020-12-16 LAB — COMPREHENSIVE METABOLIC PANEL
ALT: 91 U/L — ABNORMAL HIGH (ref 0–44)
AST: 57 U/L — ABNORMAL HIGH (ref 15–41)
Albumin: 2.4 g/dL — ABNORMAL LOW (ref 3.5–5.0)
Alkaline Phosphatase: 56 U/L (ref 38–126)
Anion gap: 12 (ref 5–15)
BUN: 24 mg/dL — ABNORMAL HIGH (ref 8–23)
CO2: 22 mmol/L (ref 22–32)
Calcium: 8.6 mg/dL — ABNORMAL LOW (ref 8.9–10.3)
Chloride: 101 mmol/L (ref 98–111)
Creatinine, Ser: 0.77 mg/dL (ref 0.61–1.24)
GFR, Estimated: >60 mL/min (ref >60)
Glucose, Bld: 183 mg/dL — ABNORMAL HIGH (ref 70–99)
Potassium: 4.3 mmol/L (ref 3.5–5.1)
Sodium: 135 mmol/L (ref 135–145)
Total Bilirubin: 0.4 mg/dL (ref 0.3–1.2)
Total Protein: 6.1 g/dL — ABNORMAL LOW (ref 6.5–8.1)

## 2020-12-16 LAB — CBC WITH DIFFERENTIAL/PLATELET
Abs Immature Granulocytes: 0.17 10*3/uL — ABNORMAL HIGH (ref 0.00–0.07)
Basophils Absolute: 0 10*3/uL (ref 0.0–0.1)
Basophils Relative: 0 %
Eosinophils Absolute: 0 10*3/uL (ref 0.0–0.5)
Eosinophils Relative: 0 %
HCT: 39.7 % (ref 39.0–52.0)
Hemoglobin: 12.7 g/dL — ABNORMAL LOW (ref 13.0–17.0)
Immature Granulocytes: 2 %
Lymphocytes Relative: 7 %
Lymphs Abs: 0.7 10*3/uL (ref 0.7–4.0)
MCH: 27 pg (ref 26.0–34.0)
MCHC: 32 g/dL (ref 30.0–36.0)
MCV: 84.3 fL (ref 80.0–100.0)
Monocytes Absolute: 0.2 10*3/uL (ref 0.1–1.0)
Monocytes Relative: 2 %
Neutro Abs: 8.5 10*3/uL — ABNORMAL HIGH (ref 1.7–7.7)
Neutrophils Relative %: 89 %
Platelets: 425 10*3/uL — ABNORMAL HIGH (ref 150–400)
RBC: 4.71 MIL/uL (ref 4.22–5.81)
RDW: 13.8 % (ref 11.5–15.5)
WBC: 9.6 10*3/uL (ref 4.0–10.5)
nRBC: 0 % (ref 0.0–0.2)

## 2020-12-16 LAB — C-REACTIVE PROTEIN: CRP: 18 mg/dL — ABNORMAL HIGH (ref <1.0)

## 2020-12-16 LAB — T4, FREE: Free T4: 1.07 ng/dL (ref 0.61–1.12)

## 2020-12-16 LAB — D-DIMER, QUANTITATIVE: D-Dimer, Quant: 15 ug/mL-FEU — ABNORMAL HIGH (ref 0.00–0.50)

## 2020-12-16 LAB — TSH: TSH: 0.681 u[IU]/mL (ref 0.350–4.500)

## 2020-12-16 LAB — FERRITIN: Ferritin: 707 ng/mL — ABNORMAL HIGH (ref 24–336)

## 2020-12-16 LAB — MAGNESIUM: Magnesium: 2.2 mg/dL (ref 1.7–2.4)

## 2020-12-16 LAB — PHOSPHORUS: Phosphorus: 4 mg/dL (ref 2.5–4.6)

## 2020-12-16 MED ORDER — BARICITINIB 2 MG PO TABS
4.0000 mg | ORAL_TABLET | Freq: Every day | ORAL | Status: DC
  Administered 2020-12-16 – 2020-12-19 (×4): 4 mg via ORAL
  Filled 2020-12-16 (×4): qty 2

## 2020-12-16 MED ORDER — ATORVASTATIN CALCIUM 20 MG PO TABS: 20.0000 mg | ORAL_TABLET | Freq: Every day | ORAL | 1 refills | Status: DC

## 2020-12-16 MED ORDER — FINASTERIDE 5 MG PO TABS: 5.0000 mg | ORAL_TABLET | Freq: Every day | ORAL | 1 refills | Status: DC

## 2020-12-16 MED ORDER — ENSURE ENLIVE PO LIQD
237.0000 mL | Freq: Three times a day (TID) | ORAL | Status: DC
  Administered 2020-12-16 – 2020-12-18 (×3): 237 mL via ORAL

## 2020-12-16 MED ORDER — ENSURE ENLIVE PO LIQD: 237.0000 mL | Freq: Two times a day (BID) | ORAL | Status: DC

## 2020-12-16 MED ORDER — ENOXAPARIN SODIUM 60 MG/0.6ML ~~LOC~~ SOLN
55.0000 mg | Freq: Two times a day (BID) | SUBCUTANEOUS | Status: DC
  Administered 2020-12-16 – 2020-12-17 (×3): 55 mg via SUBCUTANEOUS
  Filled 2020-12-16 (×3): qty 0.6

## 2020-12-16 MED ORDER — PANTOPRAZOLE SODIUM 40 MG PO TBEC
40.0000 mg | DELAYED_RELEASE_TABLET | Freq: Every day | ORAL | Status: DC
  Administered 2020-12-16 – 2020-12-19 (×4): 40 mg via ORAL
  Filled 2020-12-16 (×4): qty 1

## 2020-12-16 MED ORDER — METHYLPREDNISOLONE SODIUM SUCC 125 MG IJ SOLR
60.0000 mg | Freq: Two times a day (BID) | INTRAMUSCULAR | Status: DC
  Administered 2020-12-16 – 2020-12-19 (×7): 60 mg via INTRAVENOUS
  Filled 2020-12-16 (×7): qty 2

## 2020-12-16 MED ORDER — ENOXAPARIN SODIUM 120 MG/0.8ML ~~LOC~~ SOLN
110.0000 mg | Freq: Two times a day (BID) | SUBCUTANEOUS | Status: DC
  Filled 2020-12-16: qty 0.74

## 2020-12-16 MED ORDER — ADULT MULTIVITAMIN W/MINERALS CH
1.0000 | ORAL_TABLET | Freq: Every day | ORAL | Status: DC
  Administered 2020-12-16 – 2020-12-19 (×4): 1 via ORAL
  Filled 2020-12-16 (×4): qty 1

## 2020-12-16 NOTE — Evaluation (Signed)
Physical Therapy Evaluation Patient Details Name: Mitchell Humphrey MRN: [id number] DOB: 1941-04-10 Today's Date: 12/16/2020   History of Present Illness  Mitchell Humphrey is a 80 y.o. male with medical history significant of obesity; hypothyroidism; HTN; HLD; and BPH presenting with worsening COVID symptoms. Phone calls documented in [place].  On 1/18, he had rhinorrhea and did a home COVID test which was positive.  Clinical Impression  Patient received in bed on telephone. Agrees to PT assessment. Patient reports he is feeling better than when he came in. He is independent with bed mobility and transfers, ambulates 30 feet in room without ad, supervision. No lob or ambulatory dysfunction noted. Patient is sob and has O2 desaturations with activity. He will continue to benefit from skilled PT while here to improve activity tolerance for safe return home.      Follow Up Recommendations No PT follow up    Equipment Recommendations  None recommended by PT    Recommendations for Other Services       Precautions / Restrictions Precautions Precautions: None Restrictions Weight Bearing Restrictions: No      Mobility  Bed Mobility Overal bed mobility: Modified Independent                  Transfers Overall transfer level: Independent Equipment used: None                Ambulation/Gait Ambulation/Gait assistance: Supervision Gait Distance (Feet): 30 Feet Assistive device: None Gait Pattern/deviations: Step-through pattern Gait velocity: normal cadence   General Gait Details: normal gait pattern, ambulated without O2 donned, sats down to mid 80%s, reports no sob. Stanley replaced.  Stairs            Wheelchair Mobility    Modified Rankin (Stroke Patients Only)       Balance Overall balance assessment: Independent                                           Pertinent Vitals/Pain Pain Assessment: No/denies pain    Home Living Family/patient  expects to be discharged to:: Private residence Living Arrangements: Spouse/significant other Available Help at Discharge: Family;Available PRN/intermittently Type of Home: House Home Access: Stairs to enter       Home Equipment: None Additional Comments: patient was working, drives daily, fully independent    Prior Function Level of Independence: Independent               Hand Dominance        Extremity/Trunk Assessment   Upper Extremity Assessment Upper Extremity Assessment: Defer to OT evaluation    Lower Extremity Assessment Lower Extremity Assessment: Overall WFL for tasks assessed    Cervical / Trunk Assessment Cervical / Trunk Assessment: Normal  Communication   Communication: No difficulties  Cognition Arousal/Alertness: Awake/alert Behavior During Therapy: WFL for tasks assessed/performed Overall Cognitive Status: Within Functional Limits for tasks assessed                                        General Comments      Exercises     Assessment/Plan    PT Assessment Patient needs continued PT services  PT Problem List Decreased activity tolerance;Decreased mobility       PT Treatment Interventions Gait training;Functional mobility training;Stair training;Therapeutic exercise;Therapeutic  activities    PT Goals (Current goals can be found in the Care Plan section)  Acute Rehab PT Goals Patient Stated Goal: to feel better PT Goal Formulation: With patient Time For Goal Achievement: 12/20/20 Potential to Achieve Goals: Good    Frequency Min 2X/week   Barriers to discharge        Co-evaluation               AM-PAC PT "6 Clicks" Mobility  Outcome Measure Help needed turning from your back to your side while in a flat bed without using bedrails?: None Help needed moving from lying on your back to sitting on the side of a flat bed without using bedrails?: None Help needed moving to and from a bed to a chair (including a  wheelchair)?: A Little Help needed standing up from a chair using your arms (e.g., wheelchair or bedside chair)?: None Help needed to walk in hospital room?: A Little Help needed climbing 3-5 steps with a railing? : A Little 6 Click Score: 21    End of Session Equipment Utilized During Treatment: Oxygen Activity Tolerance: Patient tolerated treatment well Patient left: in bed;with call bell/phone within reach Nurse Communication: Mobility status PT Visit Diagnosis: Muscle weakness (generalized) (M62.81)    Time: 1515-1530 PT Time Calculation (min) (ACUTE ONLY): 15 min   Charges:   PT Evaluation $PT Eval Moderate Complexity: 1 Mod          Kailia Starry, PT, GCS 12/16/20,3:44 PM

## 2020-12-16 NOTE — Plan of Care (Signed)

## 2020-12-16 NOTE — Progress Notes (Signed)
PROGRESS NOTE                                                                             PROGRESS NOTE                                                                                                                                                                                                             Patient Demographics:    Mitchell Humphrey, is a 80 y.o. male, DOB - May 19, 1941, [id number]  Outpatient Primary MD for the patient is Inda Coke, [place]    LOS - 1  Admit date - 12/15/2020    Chief Complaint  Patient presents with  . Covid Positive  . Shortness of Breath       Brief Narrative     HPI: Mitchell Humphrey is a 80 y.o. male with medical history significant of obesity; hypothyroidism; HTN; HLD; and BPH presenting with worsening COVID symptoms. Phone calls documented in [place].  On 1/18, he had rhinorrhea and did a home COVID test which was positive.  He had a PCP visit on 1/19 without apparent in-office COVID testing but with referral for Mab treatment; unfortunately, due to high demand he was not thought to be eligible.  He was seen on a virtual visit and then in the ER on 1/22 for AMS, headache, weakness, cough, and nasal congestion.  Fever to 103.  Negative CXR, O2 96%.  He was recommended for supportive care; I do not see evidence of COVID testing. ID documented a phone call on 1/23 and patient was thought to qualify for outpatient treatment but there does not appear to have been f/u since.  He reports that he has had waxing and waning symptoms with severe fatigue, myalgias (now concentrated in his forearms), and increasing SOB.  He felt like he had to do something because his symptoms just wouldn't go away.  He went to urgent care this morning (did get swabbed there) and eventually was sent to the ER.   ED Course:  COVID with  hypoxia.  Diagnosed on 1/18.  CXR with multifocal PNA.  Consider CTA if concern for PE.  sats as low as 82% at Urgent  Care, started on 4L with improvement.   Subjective:    Mitchell Humphrey today [place] any chest pain, nausea or vomiting, reports dyspnea with no significant change from yesterday .   Assessment  & Plan :    Principal Problem:   Acute hypoxemic respiratory failure due to COVID-19 [facility]) Active Problems:   Benign essential hypertension   Hypothyroidism (acquired)   Dyslipidemia   Severe obesity (BMI 35.0-35.9 with comorbidity) (HCC)  Acute Hypoxic Resp. Failure due to Acute Covid 19 Viral Pneumonitis during the ongoing 2020 Covid 19 Pandemic  -Patient is vaccinated with [place] in April of last year, but did not receive booster. -Patient with 6 L nasal-nasal cannula, chest significant for large multifocal opacities due to COVID-19 of pneumonia. -Continue with IV Solu-Medrol. -Continue with IV remdesivir -Discussed Baricitinib  patient denies any known history of active diverticulitis, tuberculosis or hepatitis, understands the risks and benefits and wants to proceed with baricitinib. - Encouraged the patient to sit up in chair in the daytime use I-S and flutter valve for pulmonary toiletry and then prone in bed when at night.  Will advance activity and titrate down oxygen as possible.  Elevated  D-dimers -D-dimer significantly elevated at 17 on presentation, CTA with no PE, venous Doppler with no DVT, he remains high risk for VTE, continue with subcu Lovenox at intermediate dose 0.5 mg/kg  Q 12 hours,    SpO2: 94 % O2 Flow Rate (L/min): 6 L/min  Recent Labs  Lab 12/15/20 1027 12/15/20 1239 12/15/20 1240 12/15/20 1419 12/16/20 0313  WBC 10.3  --   --   --  9.6  PLT 459*  --   --   --  425*  CRP  --   --  19.6*  --  18.0*  DDIMER  --   --  17.73*  --  15.00*  PROCALCITON  --   --  <0.10  --   --   AST  --   --   --   --  57*  ALT  --   --   --   --  91*  ALKPHOS  --   --   --   --  56  BILITOT  --   --   --   --  0.4  ALBUMIN  --   --   --   --  2.4*  LATICACIDVEN  --  1.9  --   --    --   SARSCOV2NAA  --   --   --  POSITIVE*  --        ABG  No results found for: PHART, PCO2ART, PO2ART, HCO3, TCO2, ACIDBASEDEF, O2SAT  Transaminitis -Due to Covid, continue to monitor closely   HTN -Continue HCTZ, enalapril -Continue ASA  HLD -Continue Lipitor  Hypothyroidism -Check TSH - it was elevated on 12/8 -Continue Synthroid at current dose for now  BPH -Continue Proscar, Myrbetriq, Flomax  Obesity -BMI is 37.2 -Weight loss should be encouraged -Outpatient PCP/bariatric medicine f/u encouraged    Condition - Extremely Guarded  Family Communication  :  Wife by phone  Code Status :  full  Consults  :  none  Status is: Inpatient  Remains inpatient appropriate because:IV treatments appropriate due to intensity of illness or inability to take PO   Dispo: The patient is from: Home  Anticipated d/c is to: Home              Anticipated d/c date is: > 3 days              Patient currently is not medically stable to d/c.   Difficult to place patient No      DVT Prophylaxis  :  Lovenox   Lab Results  Component Value Date   PLT 425 (H) 12/16/2020    Diet :  Diet Order            Diet regular Room service appropriate? Yes; Fluid consistency: Thin  Diet effective now                  Inpatient Medications  Scheduled Meds: . vitamin C  500 mg Oral Daily  . aspirin EC  81 mg Oral Daily  . atorvastatin  20 mg Oral q1800  . baricitinib  4 mg Oral Daily  . docusate sodium  100 mg Oral BID  . enalapril  10 mg Oral Daily  . enoxaparin (LOVENOX) injection  55 mg Subcutaneous BID  . famotidine  20 mg Oral Daily  . feeding supplement  237 mL Oral TID BM  . finasteride  5 mg Oral Daily  . fluticasone  2 spray Each Nare Daily  . hydrochlorothiazide  25 mg Oral Daily  . levothyroxine  150 mcg Oral Q0600  . loratadine  10 mg Oral Daily  . methylPREDNISolone (SOLU-MEDROL) injection  60 mg Intravenous Q12H  . mirabegron ER  25 mg  Oral Daily  . multivitamin with minerals  1 tablet Oral Daily  . pantoprazole  40 mg Oral Daily  . sodium chloride flush  3 mL Intravenous Q12H  . tamsulosin  0.4 mg Oral Daily  . zinc sulfate  220 mg Oral Daily   Continuous Infusions: . sodium chloride    . remdesivir 100 mg in NS 100 mL 100 mg (12/16/20 1100)   PRN Meds:.sodium chloride, acetaminophen, albuterol, bisacodyl, chlorpheniramine-HYDROcodone, guaiFENesin-dextromethorphan, ondansetron **OR** ondansetron (ZOFRAN) IV, oxyCODONE, polyethylene glycol, sodium chloride flush, sodium phosphate  Antibiotics  :    Anti-infectives (From admission, onward)   Start     Dose/Rate Route Frequency Ordered Stop   12/16/20 1000  remdesivir 100 mg in sodium chloride 0.9 % 100 mL IVPB       "Followed by" Linked Group Details   100 mg 200 mL/hr over 30 Minutes Intravenous Daily 12/15/20 1157 12/20/20 0959   12/15/20 1200  remdesivir 200 mg in sodium chloride 0.9% 250 mL IVPB       "Followed by" Linked Group Details   200 mg 580 mL/hr over 30 Minutes Intravenous Once 12/15/20 1157 12/15/20 1715        Reda Gettis M.D on 12/16/2020 at 3:29 PM  To page go to [email or web address]   [facility] -  Office  [phone]       Objective:   Vitals:   12/16/20 0428 12/16/20 0809 12/16/20 1228 12/16/20 1229  BP:    132/64  Pulse:   84 75  Resp:  20    Temp:  97.7 F (36.5 C)  (!) 97.5 F (36.4 C)  TempSrc:  Oral  Oral  SpO2:   95% 94%  Weight: 109.5 kg     Height: 5\' 11"  (1.803 m)       Wt Readings from Last 3 Encounters:  12/16/20 109.5 kg  12/04/20 111.1 kg  10/23/20 116.7 kg  Intake/Output Summary (Last 24 hours) at 12/16/2020 1529 Last data filed at 12/15/2020 1715 Gross per 24 hour  Intake 250 ml  Output -  Net 250 ml     Physical Exam  Awake Alert, No new F.N deficits, Normal affect Symmetrical Chest wall movement, Good air movement bilaterally, CTAB RRR,No Gallops,Rubs or new Murmurs, No  Parasternal Heave +ve B.Sounds, Abd Soft, No tenderness,  No rebound - guarding or rigidity. No Cyanosis, Clubbing or edema, No new Rash or bruise      Data Review:    CBC Recent Labs  Lab 12/15/20 1027 12/16/20 0313  WBC 10.3 9.6  HGB 12.8* 12.7*  HCT 39.8 39.7  PLT 459* 425*  MCV 82.2 84.3  MCH 26.4 27.0  MCHC 32.2 32.0  RDW 13.8 13.8  LYMPHSABS  --  0.7  MONOABS  --  0.2  EOSABS  --  0.0  BASOSABS  --  0.0    Recent Labs  Lab 12/15/20 1027 12/15/20 1239 12/15/20 1240 12/16/20 0313  NA 134*  --   --  135  K 4.0  --   --  4.3  CL 100  --   --  101  CO2 23  --   --  22  GLUCOSE 153*  --   --  183*  BUN 18  --   --  24*  CREATININE 0.88  --   --  0.77  CALCIUM 8.6*  --   --  8.6*  AST  --   --   --  57*  ALT  --   --   --  91*  ALKPHOS  --   --   --  56  BILITOT  --   --   --  0.4  ALBUMIN  --   --   --  2.4*  MG  --   --   --  2.2  CRP  --   --  19.6* 18.0*  DDIMER  --   --  17.73* 15.00*  PROCALCITON  --   --  <0.10  --   LATICACIDVEN  --  1.9  --   --   TSH  --   --   --  0.681    ------------------------------------------------------------------------------------------------------------------ Recent Labs    12/15/20 1239  TRIG 121    No results found for: HGBA1C ------------------------------------------------------------------------------------------------------------------ Recent Labs    12/16/20 0313  TSH 0.681    Cardiac Enzymes No results for input(s): CKMB, TROPONINI, MYOGLOBIN in the last 168 hours.  Invalid input(s): CK ------------------------------------------------------------------------------------------------------------------ No results found for: BNP  Micro Results Recent Results (from the past 240 hour(s))  SARS Coronavirus 2 by RT PCR (hospital order, performed in [facility] hospital lab) Nasopharyngeal Nasopharyngeal Swab     Status: Abnormal   Collection Time: 12/15/20  2:19 PM   Specimen: Nasopharyngeal Swab   Result Value Ref Range Status   SARS Coronavirus 2 POSITIVE (A) NEGATIVE Final    Comment: RESULT CALLED TO, READ BACK BY AND VERIFIED WITH: Trudi Ida RN 12/15/20 AT 1525 SK  (NOTE) SARS-CoV-2 target nucleic acids are DETECTED  SARS-CoV-2 RNA is generally detectable in upper respiratory specimens  during the acute phase of infection.  Positive results are indicative  of the presence of the identified virus, but do not rule out bacterial infection or co-infection with other pathogens not detected by the test.  Clinical correlation with patient history and  other diagnostic information is necessary to determine patient infection status.  The expected result is negative.  Fact Sheet for Patients:   StrictlyIdeas.no   Fact Sheet for Healthcare Providers:   BankingDealers.co.za    This test is not yet approved or cleared by the [place] FDA and  has been authorized for detection and/or diagnosis of SARS-CoV-2 by FDA under an Emergency Use Authorization (EUA).  This EUA will remain in effect (meaning this tes t can be used) for the duration of  the COVID-19 declaration under Section 564(b)(1) of the Act, 21 U.S.C. section 360-bbb-3(b)(1), unless the authorization is terminated or revoked sooner.  Performed at [facility], [place] [address], [place], [place] [address]     Radiology Reports CT ANGIO CHEST PE W OR WO CONTRAST  Result Date: 12/15/2020 CLINICAL DATA:  Shortness of breath and hypoxia. COVID-19 positive. Myalgias. EXAM: CT ANGIOGRAPHY CHEST WITH CONTRAST TECHNIQUE: Multidetector CT imaging of the chest was performed using the standard protocol during bolus administration of intravenous contrast. Multiplanar CT image reconstructions and MIPs were obtained to evaluate the vascular anatomy. CONTRAST:  35mL OMNIPAQUE IOHEXOL 350 MG/ML SOLN COMPARISON:  Portable chest obtained earlier today. FINDINGS: Cardiovascular: Mildly  enlarged heart. Normally opacified pulmonary arteries with no pulmonary arterial filling defects seen. Mediastinum/Nodes: Mildly enlarged subcarinal nodes. The largest has a short axis diameter of 11 mm on image number 48 series 5. There is also a mildly enlarged right paratracheal node with a short axis diameter 10 mm on image number 15 series 5. Unremarkable thyroid gland.  No visible esophageal abnormality. Lungs/Pleura: Large number of focal patchy opacities in both lungs, involving all lobes. Small left pleural effusion and minimal right pleural effusion. Upper Abdomen: Multiple surgical clips in the region of the gastroesophageal junction. 2.6 cm oval mass in the right adrenal gland with a mean density of 4 Hounsfield units on image number 88 series 5. Accessory splenule inferior to the spleen. Musculoskeletal: Thoracic and lower cervical spine degenerative changes. Review of the MIP images confirms the above findings. IMPRESSION: 1. No pulmonary emboli. 2. Large number of focal patchy opacities in both lungs, involving all lobes, compatible with COVID-19 pneumonia. 3. Small left pleural effusion and minimal right pleural effusion. 4. Mild mediastinal adenopathy, most likely reactive. 5. 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 Electronically Signed   By: Claudie Revering M.D.   On: 12/15/2020 14:27   DG Chest Portable 1 View  Result Date: 12/15/2020 CLINICAL DATA:  Shortness of breath EXAM: PORTABLE CHEST 1 VIEW COMPARISON:  Chest x-rays dated 12/07/2020 and 04/10/2008 FINDINGS: Worsening bilateral airspace opacities, LEFT slightly greater than RIGHT. No pleural effusion or pneumothorax is seen. Heart size and mediastinal contours are stable. IMPRESSION: Worsening bilateral multifocal pneumonia. Electronically Signed   By: Franki Cabot M.D.   On: 12/15/2020 10:36   VAS [place] LOWER EXTREMITY VENOUS (DVT)  Result Date: 12/16/2020  Lower Venous DVT Study Indications: Elevated Ddimer.  Risk Factors: COVID 19 positive.  Comparison Study: No prior studies. Performing Technologist: Oliver Hum RVT  Examination Guidelines: A complete evaluation includes B-mode imaging, spectral Doppler, color Doppler, and power Doppler as needed of all accessible portions of each vessel. Bilateral testing is considered an integral part of a complete examination. Limited examinations for reoccurring indications may be performed as noted. The reflux portion of the exam is performed with the patient in reverse Trendelenburg.  +---------+---------------+---------+-----------+----------+--------------+ RIGHT    CompressibilityPhasicitySpontaneityPropertiesThrombus Aging +---------+---------------+---------+-----------+----------+--------------+ CFV      Full           Yes  Yes                                 +---------+---------------+---------+-----------+----------+--------------+ SFJ      Full                                                        +---------+---------------+---------+-----------+----------+--------------+ FV Prox  Full                                                        +---------+---------------+---------+-----------+----------+--------------+ FV Mid   Full                                                        +---------+---------------+---------+-----------+----------+--------------+ FV DistalFull                                                        +---------+---------------+---------+-----------+----------+--------------+ PFV      Full                                                        +---------+---------------+---------+-----------+----------+--------------+ POP      Full           Yes      Yes                                 +---------+---------------+---------+-----------+----------+--------------+ PTV      Full                                                        +---------+---------------+---------+-----------+----------+--------------+ PERO      Full                                                        +---------+---------------+---------+-----------+----------+--------------+   +---------+---------------+---------+-----------+----------+--------------+ LEFT     CompressibilityPhasicitySpontaneityPropertiesThrombus Aging +---------+---------------+---------+-----------+----------+--------------+ CFV      Full           Yes      Yes                                 +---------+---------------+---------+-----------+----------+--------------+ SFJ      Full                                                        +---------+---------------+---------+-----------+----------+--------------+  FV Prox  Full                                                        +---------+---------------+---------+-----------+----------+--------------+ FV Mid   Full                                                        +---------+---------------+---------+-----------+----------+--------------+ FV DistalFull                                                        +---------+---------------+---------+-----------+----------+--------------+ PFV      Full                                                        +---------+---------------+---------+-----------+----------+--------------+ POP      Full           Yes      Yes                                 +---------+---------------+---------+-----------+----------+--------------+ PTV      Full                                                        +---------+---------------+---------+-----------+----------+--------------+ PERO     Full                                                        +---------+---------------+---------+-----------+----------+--------------+     Summary: RIGHT: - There is no evidence of deep vein thrombosis in the lower extremity.  - No cystic structure found in the popliteal fossa.  LEFT: - There is no evidence of deep vein thrombosis in the  lower extremity.  - No cystic structure found in the popliteal fossa.  *See table(s) above for measurements and observations.    Preliminary

## 2020-12-16 NOTE — Telephone Encounter (Signed)
Nurse Assessment Nurse: Radford Pax, RN, Eugene Garnet Date/Time Eilene Ghazi Time): 12/14/2020 1:00:06 PM Confirm and document reason for call. If symptomatic, describe symptoms. ---Caller states that husband has covid. Has had a temp off and on and severe brain fog, difficulty walking. Very disoriented. Had a temp of 101. Some coughing at night. Does the patient have any new or worsening symptoms? ---Yes Will a triage be completed? ---Yes Related visit to physician within the last 2 weeks? ---No Does the PT have any chronic conditions? (i.e. diabetes, asthma, this includes High risk factors for pregnancy, etc.) ---Yes List chronic conditions. ---HTN Is this a behavioral health or substance abuse call? ---No Guidelines Guideline Title Affirmed Question Affirmed Notes Nurse Date/Time (Sentinel Time) COVID-19 - Diagnosed or Suspected Difficult to awaken or acting confused (e.g., disoriented, slurred speech) Turner, RN, Eugene Garnet 12/14/2020 1:04:25 PM PLEASE NOTE: All timestamps contained within this report are represented as [place] Standard Time. CONFIDENTIALTY NOTICE: This fax transmission is intended only for the addressee. It contains information that is legally privileged, confidential or otherwise protected from use or disclosure. If you are not the intended recipient, you are strictly prohibited from reviewing, disclosing, copying using or disseminating any of this information or taking any action in reliance on or regarding this information. If you have received this fax in error, please notify us immediately by telephone so that we can arrange for its return to [place]. Phone: [phone], Toll-Free: [phone], Fax: [phone] Page: 2 of 2 Call Id: [id number] [place]. Time Eilene Ghazi Time) Disposition Final User 12/14/2020 1:10:23 PM 911 Outcome Documentation Turner, RN, Eugene Garnet Reason: Caller declines to call 911 and sates that she will call dtr to help take pt to ED. 12/14/2020 1:09:21 PM Call EMS  911 Now Yes Turner, RN, Eugene Garnet Caller Disagree/Comply Disagree Caller Understands Yes PreDisposition Call Doctor Care Advice Given Per Guideline CALL EMS 911 NOW: * Immediate medical attention is needed. You need to hang up and call 911 (or an ambulance). CARE ADVICE given per COVID-19 - DIAGNOSED OR SUSPECTED (Adult) guideline

## 2020-12-16 NOTE — Progress Notes (Signed)
Initial Nutrition Assessment  DOCUMENTATION CODES:   Obesity unspecified  INTERVENTION:   -Increase Ensure Enlive po to TID, each supplement provides 350 kcal and 20 grams of protein -MVI with minerals daily -Magic cup TID with meals, each supplement provides 290 kcal and 9 grams of protein  NUTRITION DIAGNOSIS:   Increased nutrient needs related to acute illness (COVID-19) as evidenced by estimated needs.  GOAL:   Patient will meet greater than or equal to 90% of their needs  MONITOR:   PO intake,Supplement acceptance,Labs,Weight trends,Skin,I & O's  REASON FOR ASSESSMENT:   Malnutrition Screening Tool    ASSESSMENT:   Mitchell Humphrey is a 80 y.o. male with medical history significant of obesity; hypothyroidism; HTN; HLD; and BPH presenting with worsening COVID symptoms. Phone calls documented in [place].  On 1/18, he had rhinorrhea and did a home COVID test which was positive.  He had a PCP visit on 1/19 without apparent in-office COVID testing but with referral for Mab treatment; unfortunately, due to high demand he was not thought to be eligible.  Pt admitted with acute respiratory failure with hypoxia secondary to COVID pneumonia.  Reviewed I/O's: +250 ml x 24 hours  Pt unavailable at time of visit.   Per H&P, pt with decreased appetite over the past 2 weeks and endorses a 40 pound weight loss over this time period. No meal completions currently available to assess at this time  Reviewed wt hx; pt has experienced a 6.2% wt loss over the past 6 weeks, which is not significant for time frame.   Medications reviewed and include vitamin C, colace, IV solumedrol, remdesivir, and zinc sulfate.  Labs reviewed.   Diet Order:   Diet Order            Diet regular Room service appropriate? Yes; Fluid consistency: Thin  Diet effective now                 EDUCATION NEEDS:   No education needs have been identified at this time  Skin:  Skin Assessment: Reviewed RN  Assessment  Last BM:  12/15/20  Height:   Ht Readings from Last 1 Encounters:  12/16/20 5\' 11"  (1.803 m)    Weight:   Wt Readings from Last 1 Encounters:  12/16/20 109.5 kg    Ideal Body Weight:  78.2 kg  BMI:  Body mass index is 33.65 kg/m.  Estimated Nutritional Needs:   Kcal:  1017-5102  Protein:  140-155 grams  Fluid:  > 2 L    Loistine Chance, RD, LDN, [place] Registered Dietitian II Certified Diabetes Care and Education Specialist Please refer to [facility] for RD and/or RD on-call/weekend/after hours pager

## 2020-12-16 NOTE — Telephone Encounter (Signed)
FYI

## 2020-12-16 NOTE — Progress Notes (Addendum)
Bilateral lower extremity venous duplex has been completed. Preliminary results can be found in CV Proc through chart review.  Results were given to the patient's nurse, Mickel Baas.  12/16/20 11:58 AM Mitchell Humphrey RVT

## 2020-12-16 NOTE — Evaluation (Signed)
Occupational Therapy Evaluation Patient Details Name: Mitchell Humphrey MRN: [id number] DOB: Apr 24, 1941 Today's Date: 12/16/2020    History of Present Illness Mitchell Humphrey is a 80 y.o. male with medical history significant of obesity; hypothyroidism; HTN; HLD; and BPH presenting with worsening COVID symptoms. Phone calls documented in [place].  On 1/18, he had rhinorrhea and did a home COVID test which was positive.   Clinical Impression   Pt was independent prior to admission. Presents with decreased activity tolerance and requires 4L 02 to keep Sp02>90% with activity. Pt requires up to supervision for ADL transfers. He will likely progress well. Will follow to instruct in energy conservation strategies and build activity tolerance.     Follow Up Recommendations  No OT follow up    Equipment Recommendations  None recommended by OT    Recommendations for Other Services       Precautions / Restrictions Precautions Precautions: Other (comment) Precaution Comments: airborne Restrictions Weight Bearing Restrictions: No      Mobility Bed Mobility Overal bed mobility: Modified Independent             General bed mobility comments: HOB up    Transfers Overall transfer level: Needs assistance Equipment used: None Transfers: Sit to/from Stand Sit to Stand: Supervision         General transfer comment: slow to rise, but no assist needed, supervised for lines/safety    Balance Overall balance assessment: Independent                                         ADL either performed or assessed with clinical judgement   ADL                                         General ADL Comments: Functioning at a set up to supervision level for safety with lines. Will follow to educate in energy conservation strategies and increase activity tolerance.     Vision Baseline Vision/History: Wears glasses Wears Glasses: Reading only       Perception      Praxis      Pertinent Vitals/Pain Pain Assessment: No/denies pain     Hand Dominance Right   Extremity/Trunk Assessment Upper Extremity Assessment Upper Extremity Assessment: Overall WFL for tasks assessed   Lower Extremity Assessment Lower Extremity Assessment: Defer to PT evaluation   Cervical / Trunk Assessment Cervical / Trunk Assessment: Normal   Communication Communication Communication: No difficulties   Cognition Arousal/Alertness: Awake/alert Behavior During Therapy: WFL for tasks assessed/performed Overall Cognitive Status: Within Functional Limits for tasks assessed                                     General Comments       Exercises     Shoulder Instructions      Home Living Family/patient expects to be discharged to:: Private residence Living Arrangements: Spouse/significant other Available Help at Discharge: Family;Available PRN/intermittently Type of Home: House Home Access: Stairs to enter           [employer] Shower/Tub: Hospital doctor Toilet: Handicapped height     Home Equipment: None   Additional Comments: patient was working, drives daily, fully independent  Prior Functioning/Environment Level of Independence: Independent        Comments: works as a Freight forwarder Problem List: Decreased activity tolerance      OT Treatment/Interventions: Energy conservation;Self-care/ADL training    OT Goals(Current goals can be found in the care plan section) Acute Rehab OT Goals Patient Stated Goal: to feel better OT Goal Formulation: With patient Time For Goal Achievement: 12/30/20 Potential to Achieve Goals: Good ADL Goals Additional ADL Goal #1: Pt will tolerate 5 minutes of activity without rest break. Additional ADL Goal #2: Pt will state at least 3 energy conservation strategies and pursed lip breathing as instructed.  OT Frequency: Min 2X/week   Barriers to D/C:             Co-evaluation              AM-PAC OT "6 Clicks" Daily Activity     Outcome Measure Help from another person eating meals?: None Help from another person taking care of personal grooming?: None Help from another person toileting, which includes using toliet, bedpan, or urinal?: None Help from another person bathing (including washing, rinsing, drying)?: None Help from another person to put on and taking off regular upper body clothing?: None Help from another person to put on and taking off regular lower body clothing?: None 6 Click Score: 24   End of Session Equipment Utilized During Treatment: Oxygen (4L) Nurse Communication: Mobility status  Activity Tolerance: Patient tolerated treatment well Patient left: in chair;with call bell/phone within reach  OT Visit Diagnosis: Other (comment) (decreased activity tolerance)                Time: 4970-2637 OT Time Calculation (min): 29 min Charges:  OT General Charges $OT Visit: 1 Visit OT Evaluation $OT Eval Low Complexity: 1 Low OT Treatments $Self Care/Home Management : 8-22 mins  Nestor Lewandowsky, OTR/L Acute Rehabilitation Services Pager: [phone] Office: [phone]  Malka So 12/16/2020, 4:20 PM

## 2020-12-17 DIAGNOSIS — I82402 Acute embolism and thrombosis of unspecified deep veins of left lower extremity: Secondary | ICD-10-CM

## 2020-12-17 LAB — COMPREHENSIVE METABOLIC PANEL
ALT: 125 U/L — ABNORMAL HIGH (ref 0–44)
AST: 74 U/L — ABNORMAL HIGH (ref 15–41)
Albumin: 2.4 g/dL — ABNORMAL LOW (ref 3.5–5.0)
Alkaline Phosphatase: 54 U/L (ref 38–126)
Anion gap: 10 (ref 5–15)
BUN: 30 mg/dL — ABNORMAL HIGH (ref 8–23)
CO2: 27 mmol/L (ref 22–32)
Calcium: 9.5 mg/dL (ref 8.9–10.3)
Chloride: 102 mmol/L (ref 98–111)
Creatinine, Ser: 0.81 mg/dL (ref 0.61–1.24)
GFR, Estimated: >60 mL/min (ref >60)
Glucose, Bld: 184 mg/dL — ABNORMAL HIGH (ref 70–99)
Potassium: 4.9 mmol/L (ref 3.5–5.1)
Sodium: 139 mmol/L (ref 135–145)
Total Bilirubin: 0.6 mg/dL (ref 0.3–1.2)
Total Protein: 5.9 g/dL — ABNORMAL LOW (ref 6.5–8.1)

## 2020-12-17 LAB — CBC WITH DIFFERENTIAL/PLATELET
Abs Immature Granulocytes: 0.35 10*3/uL — ABNORMAL HIGH (ref 0.00–0.07)
Basophils Absolute: 0 10*3/uL (ref 0.0–0.1)
Basophils Relative: 0 %
Eosinophils Absolute: 0 10*3/uL (ref 0.0–0.5)
Eosinophils Relative: 0 %
HCT: 38 % — ABNORMAL LOW (ref 39.0–52.0)
Hemoglobin: 12.6 g/dL — ABNORMAL LOW (ref 13.0–17.0)
Immature Granulocytes: 2 %
Lymphocytes Relative: 9 %
Lymphs Abs: 1.3 10*3/uL (ref 0.7–4.0)
MCH: 27.4 pg (ref 26.0–34.0)
MCHC: 33.2 g/dL (ref 30.0–36.0)
MCV: 82.6 fL (ref 80.0–100.0)
Monocytes Absolute: 0.7 10*3/uL (ref 0.1–1.0)
Monocytes Relative: 4 %
Neutro Abs: 12.5 10*3/uL — ABNORMAL HIGH (ref 1.7–7.7)
Neutrophils Relative %: 85 %
Platelets: 451 10*3/uL — ABNORMAL HIGH (ref 150–400)
RBC: 4.6 MIL/uL (ref 4.22–5.81)
RDW: 14.2 % (ref 11.5–15.5)
WBC: 14.8 10*3/uL — ABNORMAL HIGH (ref 4.0–10.5)
nRBC: 0 % (ref 0.0–0.2)

## 2020-12-17 LAB — MAGNESIUM: Magnesium: 2.4 mg/dL (ref 1.7–2.4)

## 2020-12-17 LAB — D-DIMER, QUANTITATIVE: D-Dimer, Quant: 4.6 ug/mL-FEU — ABNORMAL HIGH (ref 0.00–0.50)

## 2020-12-17 LAB — FERRITIN: Ferritin: 587 ng/mL — ABNORMAL HIGH (ref 24–336)

## 2020-12-17 LAB — C-REACTIVE PROTEIN: CRP: 7.9 mg/dL — ABNORMAL HIGH (ref <1.0)

## 2020-12-17 LAB — PHOSPHORUS: Phosphorus: 3.6 mg/dL (ref 2.5–4.6)

## 2020-12-17 MED ORDER — APIXABAN 5 MG PO TABS: 5.0000 mg | ORAL_TABLET | Freq: Two times a day (BID) | ORAL | Status: DC

## 2020-12-17 MED ORDER — APIXABAN 5 MG PO TABS: 10.0000 mg | ORAL_TABLET | Freq: Two times a day (BID) | ORAL | Status: DC

## 2020-12-17 MED ORDER — ENOXAPARIN SODIUM 60 MG/0.6ML ~~LOC~~ SOLN
0.5000 mg/kg | Freq: Once | SUBCUTANEOUS | Status: AC
  Administered 2020-12-17: 55 mg via SUBCUTANEOUS
  Filled 2020-12-17: qty 0.6

## 2020-12-17 MED ORDER — APIXABAN 5 MG PO TABS
10.0000 mg | ORAL_TABLET | Freq: Two times a day (BID) | ORAL | Status: DC
  Administered 2020-12-17 – 2020-12-19 (×4): 10 mg via ORAL
  Filled 2020-12-17 (×4): qty 2

## 2020-12-17 NOTE — Progress Notes (Signed)
PROGRESS NOTE                                                                             PROGRESS NOTE                                                                                                                                                                                                             Patient Demographics:    Mitchell Humphrey, is a 80 y.o. male, DOB - 08-27-1941, ZI:[id number]  Outpatient Primary MD for the patient is Inda Coke, [place]    LOS - 2  Admit date - 12/15/2020    Chief Complaint  Patient presents with  . Covid Positive  . Shortness of Breath       Brief Narrative      80 y.o. male with medical history significant of obesity; hypothyroidism; HTN; HLD; and BPH presenting with worsening COVID symptoms, admitted with hypoxia.   Subjective:   Mitchell Humphrey today chest pain, nausea or vomiting, reports dyspnea on cough has improved.    Assessment  & Plan :    Principal Problem:   Acute hypoxemic respiratory failure due to COVID-19 [facility]) Active Problems:   Benign essential hypertension   Hypothyroidism (acquired)   Dyslipidemia   Severe obesity (BMI 35.0-35.9 with comorbidity) (HCC)  Acute Hypoxic Resp. Failure due to Acute Covid 19 Viral Pneumonitis during the ongoing 2020 Covid 19 Pandemic  -Patient is vaccinated with [employer] in April of last year, but did not receive booster. -Patient requiring 6 L nasal cannula yesterday, this has improved to 4 L this morning . - chest significant for large multifocal opacities due to COVID-19 of pneumonia. -Continue with IV Solu-Medrol. -Continue with IV remdesivir -Started on baricitinib - Encouraged the patient to sit up in chair in the daytime use I-S and flutter valve for pulmonary toiletry and then prone in bed when at night.  Will advance activity and titrate down oxygen as possible.  Elevated  D-dimers/acute left lower extremity DVT -D-dimer significantly elevated  at 17  on presentation, CTA with no PE, venous Doppler significant for left gastrocnemius vein DVT, given elevated D-dimers, hypercoagulable from Covid will treat for full anticoagulation, will need full anticoagulation for 3 months.   SpO2: 94 % O2 Flow Rate (L/min): 4 L/min  Recent Labs  Lab 12/15/20 1027 12/15/20 1239 12/15/20 1240 12/15/20 1419 12/16/20 0313 12/17/20 0219  WBC 10.3  --   --   --  9.6 14.8*  PLT 459*  --   --   --  425* 451*  CRP  --   --  19.6*  --  18.0* 7.9*  DDIMER  --   --  17.73*  --  15.00* 4.60*  PROCALCITON  --   --  <0.10  --   --   --   AST  --   --   --   --  57* 74*  ALT  --   --   --   --  91* 125*  ALKPHOS  --   --   --   --  56 52  BILITOT  --   --   --   --  0.4 0.6  ALBUMIN  --   --   --   --  2.4* 2.4*  LATICACIDVEN  --  1.9  --   --   --   --   SARSCOV2NAA  --   --   --  POSITIVE*  --   --        ABG  No results found for: PHART, PCO2ART, PO2ART, HCO3, TCO2, ACIDBASEDEF, O2SAT  Transaminitis -Due to Covid, continue to monitor closely   HTN -Continue HCTZ, enalapril -Continue ASA  HLD -Continue Lipitor  Hypothyroidism -Check TSH - it was elevated on 12/8 -Continue Synthroid at current dose for now  BPH -Continue Proscar, Myrbetriq, Flomax  Obesity -BMI is 37.2 -Weight loss should be encouraged -Outpatient PCP/bariatric medicine f/u encouraged    Condition - Extremely Guarded  Family Communication  :  Wife by phone  Code Status :  full  Consults  :  none  Status is: Inpatient  Remains inpatient appropriate because:IV treatments appropriate due to intensity of illness or inability to take PO   Dispo: The patient is from: Home              Anticipated d/c is to: Home              Anticipated d/c date is: > 3 days              Patient currently is not medically stable to d/c.   Difficult to place patient No      DVT Prophylaxis  :  Lovenox   Lab Results  Component Value Date   PLT 451 (H)  12/17/2020    Diet :  Diet Order            Diet regular Room service appropriate? Yes; Fluid consistency: Thin  Diet effective now                  Inpatient Medications  Scheduled Meds: . apixaban  10 mg Oral BID   Followed by  . [START ON 12/24/2020] apixaban  5 mg Oral BID  . vitamin C  500 mg Oral Daily  . aspirin EC  81 mg Oral Daily  . atorvastatin  20 mg Oral q1800  . baricitinib  4 mg Oral Daily  . docusate sodium  100 mg Oral BID  . enalapril  10 mg Oral Daily  . famotidine  20 mg Oral Daily  . feeding supplement  237 mL Oral TID BM  . finasteride  5 mg Oral Daily  . fluticasone  2 spray Each Nare Daily  . hydrochlorothiazide  25 mg Oral Daily  . levothyroxine  150 mcg Oral Q0600  . loratadine  10 mg Oral Daily  . methylPREDNISolone (SOLU-MEDROL) injection  60 mg Intravenous Q12H  . mirabegron ER  25 mg Oral Daily  . multivitamin with minerals  1 tablet Oral Daily  . pantoprazole  40 mg Oral Daily  . sodium chloride flush  3 mL Intravenous Q12H  . tamsulosin  0.4 mg Oral Daily  . zinc sulfate  220 mg Oral Daily   Continuous Infusions: . sodium chloride    . remdesivir 100 mg in NS 100 mL 100 mg (12/17/20 0919)   PRN Meds:.sodium chloride, acetaminophen, albuterol, bisacodyl, chlorpheniramine-HYDROcodone, guaiFENesin-dextromethorphan, ondansetron **OR** ondansetron (ZOFRAN) IV, oxyCODONE, polyethylene glycol, sodium chloride flush, sodium phosphate  Antibiotics  :    Anti-infectives (From admission, onward)   Start     Dose/Rate Route Frequency Ordered Stop   12/16/20 1000  remdesivir 100 mg in sodium chloride 0.9 % 100 mL IVPB       "Followed by" Linked Group Details   100 mg 200 mL/hr over 30 Minutes Intravenous Daily 12/15/20 1157 12/20/20 0959   12/15/20 1200  remdesivir 200 mg in sodium chloride 0.9% 250 mL IVPB       "Followed by" Linked Group Details   200 mg 580 mL/hr over 30 Minutes Intravenous Once 12/15/20 1157 12/15/20 1715         Dawood Elgergawy M.D on 12/17/2020 at 3:24 PM  To page go to [email or web address]   [facility] -  Office  [phone]       Objective:   Vitals:   12/17/20 0500 12/17/20 0700 12/17/20 0851 12/17/20 1300  BP: 135/76 (!) 124/53 (!) 124/53 96/74  Pulse: 83 82 79 77  Resp: 17 16 18 16   Temp: 97.7 F (36.5 C) (!) 97.5 F (36.4 C)  97.8 F (36.6 C)  TempSrc: Oral Oral  Oral  SpO2: 98% 90% 92% 94%  Weight:      Height:        Wt Readings from Last 3 Encounters:  12/16/20 109.5 kg  12/04/20 111.1 kg  10/23/20 116.7 kg     Intake/Output Summary (Last 24 hours) at 12/17/2020 1524 Last data filed at 12/17/2020 0900 Gross per 24 hour  Intake 500 ml  Output 200 ml  Net 300 ml     Physical Exam  Awake Alert, Oriented X 3, No new F.N deficits, Normal affect Symmetrical Chest wall movement, Good air movement bilaterally, CTAB RRR,No Gallops,Rubs or new Murmurs, No Parasternal Heave +ve B.Sounds, Abd Soft, No tenderness, No rebound - guarding or rigidity. No Cyanosis, Clubbing or edema, No new Rash or bruise      Data Review:    CBC Recent Labs  Lab 12/15/20 1027 12/16/20 0313 12/17/20 0219  WBC 10.3 9.6 14.8*  HGB 12.8* 12.7* 12.6*  HCT 39.8 39.7 38.0*  PLT 459* 425* 451*  MCV 82.2 84.3 82.6  MCH 26.4 27.0 27.4  MCHC 32.2 32.0 33.2  RDW 13.8 13.8 14.2  LYMPHSABS  --  0.7 1.3  MONOABS  --  0.2 0.7  EOSABS  --  0.0 0.0  BASOSABS  --  0.0 0.0    Recent Labs  Lab 12/15/20 1027 12/15/20  1239 12/15/20 1240 12/16/20 0313 12/17/20 0219  NA 134*  --   --  135 139  K 4.0  --   --  4.3 4.9  CL 100  --   --  101 102  CO2 23  --   --  22 27  GLUCOSE 153*  --   --  183* 184*  BUN 18  --   --  24* 30*  CREATININE 0.88  --   --  0.77 0.81  CALCIUM 8.6*  --   --  8.6* 9.5  AST  --   --   --  57* 74*  ALT  --   --   --  91* 125*  ALKPHOS  --   --   --  56 54  BILITOT  --   --   --  0.4 0.6  ALBUMIN  --   --   --  2.4* 2.4*  MG  --   --   --  2.2 2.4   CRP  --   --  19.6* 18.0* 7.9*  DDIMER  --   --  17.73* 15.00* 4.60*  PROCALCITON  --   --  <0.10  --   --   LATICACIDVEN  --  1.9  --   --   --   TSH  --   --   --  0.681  --     ------------------------------------------------------------------------------------------------------------------ Recent Labs    12/15/20 1239  TRIG 121    No results found for: HGBA1C ------------------------------------------------------------------------------------------------------------------ Recent Labs    12/16/20 0313  TSH 0.681    Cardiac Enzymes No results for input(s): CKMB, TROPONINI, MYOGLOBIN in the last 168 hours.  Invalid input(s): CK ------------------------------------------------------------------------------------------------------------------ No results found for: BNP  Micro Results Recent Results (from the past 240 hour(s))  SARS Coronavirus 2 by RT PCR (hospital order, performed in [facility] hospital lab) Nasopharyngeal Nasopharyngeal Swab     Status: Abnormal   Collection Time: 12/15/20  2:19 PM   Specimen: Nasopharyngeal Swab  Result Value Ref Range Status   SARS Coronavirus 2 POSITIVE (A) NEGATIVE Final    Comment: RESULT CALLED TO, READ BACK BY AND VERIFIED WITH: Trudi Ida RN 12/15/20 AT 1525 SK  (NOTE) SARS-CoV-2 target nucleic acids are DETECTED  SARS-CoV-2 RNA is generally detectable in upper respiratory specimens  during the acute phase of infection.  Positive results are indicative  of the presence of the identified virus, but do not rule out bacterial infection or co-infection with other pathogens not detected by the test.  Clinical correlation with patient history and  other diagnostic information is necessary to determine patient infection status.  The expected result is negative.  Fact Sheet for Patients:   StrictlyIdeas.no   Fact Sheet for Healthcare Providers:   BankingDealers.co.za    This test  is not yet approved or cleared by the [place] FDA and  has been authorized for detection and/or diagnosis of SARS-CoV-2 by FDA under an Emergency Use Authorization (EUA).  This EUA will remain in effect (meaning this tes t can be used) for the duration of  the COVID-19 declaration under Section 564(b)(1) of the Act, 21 U.S.C. section 360-bbb-3(b)(1), unless the authorization is terminated or revoked sooner.  Performed at [facility], [place] [address] [place], [place] [address]     Radiology Reports CT ANGIO CHEST PE W OR WO CONTRAST  Result Date: 12/15/2020 CLINICAL DATA:  Shortness of breath and hypoxia. COVID-19 positive. Myalgias. EXAM:  CT ANGIOGRAPHY CHEST WITH CONTRAST TECHNIQUE: Multidetector CT imaging of the chest was performed using the standard protocol during bolus administration of intravenous contrast. Multiplanar CT image reconstructions and MIPs were obtained to evaluate the vascular anatomy. CONTRAST:  57mL OMNIPAQUE IOHEXOL 350 MG/ML SOLN COMPARISON:  Portable chest obtained earlier today. FINDINGS: Cardiovascular: Mildly enlarged heart. Normally opacified pulmonary arteries with no pulmonary arterial filling defects seen. Mediastinum/Nodes: Mildly enlarged subcarinal nodes. The largest has a short axis diameter of 11 mm on image number 48 series 5. There is also a mildly enlarged right paratracheal node with a short axis diameter 10 mm on image number 15 series 5. Unremarkable thyroid gland.  No visible esophageal abnormality. Lungs/Pleura: Large number of focal patchy opacities in both lungs, involving all lobes. Small left pleural effusion and minimal right pleural effusion. Upper Abdomen: Multiple surgical clips in the region of the gastroesophageal junction. 2.6 cm oval mass in the right adrenal gland with a mean density of 4 Hounsfield units on image number 88 series 5. Accessory splenule inferior to the spleen. Musculoskeletal: Thoracic and lower cervical spine  degenerative changes. Review of the MIP images confirms the above findings. IMPRESSION: 1. No pulmonary emboli. 2. Large number of focal patchy opacities in both lungs, involving all lobes, compatible with COVID-19 pneumonia. 3. Small left pleural effusion and minimal right pleural effusion. 4. Mild mediastinal adenopathy, most likely reactive. 5. 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 Electronically Signed   By: Claudie Revering M.D.   On: 12/15/2020 14:27   DG Chest Portable 1 View  Result Date: 12/15/2020 CLINICAL DATA:  Shortness of breath EXAM: PORTABLE CHEST 1 VIEW COMPARISON:  Chest x-rays dated 12/07/2020 and 04/10/2008 FINDINGS: Worsening bilateral airspace opacities, LEFT slightly greater than RIGHT. No pleural effusion or pneumothorax is seen. Heart size and mediastinal contours are stable. IMPRESSION: Worsening bilateral multifocal pneumonia. Electronically Signed   By: Franki Cabot M.D.   On: 12/15/2020 10:36   VAS [place] LOWER EXTREMITY VENOUS (DVT)  Result Date: 12/16/2020  Lower Venous DVT Study Indications: Elevated Ddimer.  Risk Factors: COVID 19 positive. Comparison Study: No prior studies. Performing Technologist: Oliver Hum RVT  Examination Guidelines: A complete evaluation includes B-mode imaging, spectral Doppler, color Doppler, and power Doppler as needed of all accessible portions of each vessel. Bilateral testing is considered an integral part of a complete examination. Limited examinations for reoccurring indications may be performed as noted. The reflux portion of the exam is performed with the patient in reverse Trendelenburg.  +---------+---------------+---------+-----------+----------+--------------+ RIGHT    CompressibilityPhasicitySpontaneityPropertiesThrombus Aging +---------+---------------+---------+-----------+----------+--------------+ CFV      Full           Yes      Yes                                  +---------+---------------+---------+-----------+----------+--------------+ SFJ      Full                                                        +---------+---------------+---------+-----------+----------+--------------+ FV Prox  Full                                                        +---------+---------------+---------+-----------+----------+--------------+  FV Mid   Full                                                        +---------+---------------+---------+-----------+----------+--------------+ FV DistalFull                                                        +---------+---------------+---------+-----------+----------+--------------+ PFV      Full                                                        +---------+---------------+---------+-----------+----------+--------------+ POP      Full           Yes      Yes                                 +---------+---------------+---------+-----------+----------+--------------+ PTV      Full                                                        +---------+---------------+---------+-----------+----------+--------------+ PERO     Full                                                        +---------+---------------+---------+-----------+----------+--------------+   +---------+---------------+---------+-----------+----------+--------------+ LEFT     CompressibilityPhasicitySpontaneityPropertiesThrombus Aging +---------+---------------+---------+-----------+----------+--------------+ CFV      Full           Yes      Yes                                 +---------+---------------+---------+-----------+----------+--------------+ SFJ      Full                                                        +---------+---------------+---------+-----------+----------+--------------+ FV Prox  Full                                                         +---------+---------------+---------+-----------+----------+--------------+ FV Mid   Full                                                        +---------+---------------+---------+-----------+----------+--------------+  FV DistalFull                                                        +---------+---------------+---------+-----------+----------+--------------+ PFV      Full                                                        +---------+---------------+---------+-----------+----------+--------------+ POP      Full           Yes      Yes                                 +---------+---------------+---------+-----------+----------+--------------+ PTV      Full                                                        +---------+---------------+---------+-----------+----------+--------------+ PERO     Full                                                        +---------+---------------+---------+-----------+----------+--------------+ Gastroc  Partial                                      Acute          +---------+---------------+---------+-----------+----------+--------------+     Summary: RIGHT: - There is no evidence of deep vein thrombosis in the lower extremity.  - No cystic structure found in the popliteal fossa.  LEFT: - Findings consistent with acute deep vein thrombosis involving the left gastrocnemius veins. - No cystic structure found in the popliteal fossa.  *See table(s) above for measurements and observations. Electronically signed by Monica Martinez MD on 12/16/2020 at 3:57:02 PM.    Final

## 2020-12-17 NOTE — Discharge Instructions (Addendum)
Information on my medicine - ELIQUIS (apixaban)  Why was Eliquis prescribed for you? Eliquis was prescribed to treat blood clots that may have been found in the veins of your legs (deep vein thrombosis) or in your lungs (pulmonary embolism) and to reduce the risk of them occurring again.  What do You need to know about Eliquis ? The starting dose is 10 mg (two 5 mg tablets) taken TWICE daily for the FIRST SEVEN (7) DAYS, then on 12/24/2020  the dose is reduced to ONE 5 mg tablet taken TWICE daily.  Eliquis may be taken with or without food.   Try to take the dose about the same time in the morning and in the evening. If you have difficulty swallowing the tablet whole please discuss with your pharmacist how to take the medication safely.  Take Eliquis exactly as prescribed and DO NOT stop taking Eliquis without talking to the doctor who prescribed the medication.  Stopping may increase your risk of developing a new blood clot.  Refill your prescription before you run out.  After discharge, you should have regular check-up appointments with your healthcare provider that is prescribing your Eliquis.    What do you do if you miss a dose? If a dose of ELIQUIS is not taken at the scheduled time, take it as soon as possible on the same day and twice-daily administration should be resumed. The dose should not be doubled to make up for a missed dose.  Important Safety Information A possible side effect of Eliquis is bleeding. You should call your healthcare provider right away if you experience any of the following: ? Bleeding from an injury or your nose that does not stop. ? Unusual colored urine (red or dark brown) or unusual colored stools (red or black). ? Unusual bruising for unknown reasons. ? A serious fall or if you hit your head (even if there is no bleeding).  Some medicines may interact with Eliquis and might increase your risk of bleeding or clotting while on Eliquis. To help  avoid this, consult your healthcare provider or pharmacist prior to using any new prescription or non-prescription medications, including herbals, vitamins, non-steroidal anti-inflammatory drugs (NSAIDs) and supplements.  This website has more information on Eliquis (apixaban): http://[email or web address].com/eliquis/home        Person Under Monitoring Name: Mitchell Humphrey  Location: [address] [place] [address]   Infection Prevention Recommendations for Individuals Confirmed to have, or Being Evaluated for, 2019 Novel Coronavirus (COVID-19) Infection Who Receive Care at Home  Individuals who are confirmed to have, or are being evaluated for, COVID-19 should follow the prevention steps below until a healthcare provider or local or state health department says they can return to normal activities.  Stay home except to get medical care You should restrict activities outside your home, except for getting medical care. Do not go to work, school, or public areas, and do not use public transportation or taxis.  Call ahead before visiting your doctor Before your medical appointment, call the healthcare provider and tell them that you have, or are being evaluated for, COVID-19 infection. This will help the healthcare provider's office take steps to keep other people from getting infected. Ask your healthcare provider to call the local or state health department.  Monitor your symptoms Seek prompt medical attention if your illness is worsening (e.g., difficulty breathing). Before going to your medical appointment, call the healthcare provider and tell them that you have, or are being evaluated for, COVID-19  infection. Ask your healthcare provider to call the local or state health department.  Wear a facemask You should wear a facemask that covers your nose and mouth when you are in the same room with other people and when you visit a healthcare provider. People who live with or visit  you should also wear a facemask while they are in the same room with you.  Separate yourself from other people in your home As much as possible, you should stay in a different room from other people in your home. Also, you should use a separate bathroom, if available.  Avoid sharing household items You should not share dishes, drinking glasses, cups, eating utensils, towels, bedding, or other items with other people in your home. After using these items, you should wash them thoroughly with soap and water.  Cover your coughs and sneezes Cover your mouth and nose with a tissue when you cough or sneeze, or you can cough or sneeze into your sleeve. Throw used tissues in a lined trash can, and immediately wash your hands with soap and water for at least 20 seconds or use an alcohol-based hand rub.  Wash your [facility] your hands often and thoroughly with soap and water for at least 20 seconds. You can use an alcohol-based hand sanitizer if soap and water are not available and if your hands are not visibly dirty. Avoid touching your eyes, nose, and mouth with unwashed hands.   Prevention Steps for Caregivers and Household Members of Individuals Confirmed to have, or Being Evaluated for, COVID-19 Infection Being Cared for in the Home  If you live with, or provide care at home for, a person confirmed to have, or being evaluated for, COVID-19 infection please follow these guidelines to prevent infection:  Follow healthcare provider's instructions Make sure that you understand and can help the patient follow any healthcare provider instructions for all care.  Provide for the patient's basic needs You should help the patient with basic needs in the home and provide support for getting groceries, prescriptions, and other personal needs.  Monitor the patient's symptoms If they are getting sicker, call his or her medical provider and tell them that the patient has, or is being evaluated  for, COVID-19 infection. This will help the healthcare provider's office take steps to keep other people from getting infected. Ask the healthcare provider to call the local or state health department.  Limit the number of people who have contact with the patient  If possible, have only one caregiver for the patient.  Other household members should stay in another home or place of residence. If this is not possible, they should stay  in another room, or be separated from the patient as much as possible. Use a separate bathroom, if available.  Restrict visitors who do not have an essential need to be in the home.  Keep older adults, very young children, and other sick people away from the patient Keep older adults, very young children, and those who have compromised immune systems or chronic health conditions away from the patient. This includes people with chronic heart, lung, or kidney conditions, diabetes, and cancer.  Ensure good ventilation Make sure that shared spaces in the home have good air flow, such as from an air conditioner or an opened window, weather permitting.  Wash your hands often  Wash your hands often and thoroughly with soap and water for at least 20 seconds. You can use an alcohol based hand sanitizer if soap  and water are not available and if your hands are not visibly dirty.  Avoid touching your eyes, nose, and mouth with unwashed hands.  Use disposable paper towels to dry your hands. If not available, use dedicated cloth towels and replace them when they become wet.  Wear a facemask and gloves  Wear a disposable facemask at all times in the room and gloves when you touch or have contact with the patient's blood, body fluids, and/or secretions or excretions, such as sweat, saliva, sputum, nasal mucus, vomit, urine, or feces.  Ensure the mask fits over your nose and mouth tightly, and do not touch it during use.  Throw out disposable facemasks and gloves after  using them. Do not reuse.  Wash your hands immediately after removing your facemask and gloves.  If your personal clothing becomes contaminated, carefully remove clothing and launder. Wash your hands after handling contaminated clothing.  Place all used disposable facemasks, gloves, and other waste in a lined container before disposing them with other household waste.  Remove gloves and wash your hands immediately after handling these items.  Do not share dishes, glasses, or other household items with the patient  Avoid sharing household items. You should not share dishes, drinking glasses, cups, eating utensils, towels, bedding, or other items with a patient who is confirmed to have, or being evaluated for, COVID-19 infection.  After the person uses these items, you should wash them thoroughly with soap and water.  Wash laundry thoroughly  Immediately remove and wash clothes or bedding that have blood, body fluids, and/or secretions or excretions, such as sweat, saliva, sputum, nasal mucus, vomit, urine, or feces, on them.  Wear gloves when handling laundry from the patient.  Read and follow directions on labels of laundry or clothing items and detergent. In general, wash and dry with the warmest temperatures recommended on the label.  Clean all areas the individual has used often  Clean all touchable surfaces, such as counters, tabletops, doorknobs, bathroom fixtures, toilets, phones, keyboards, tablets, and bedside tables, every day. Also, clean any surfaces that may have blood, body fluids, and/or secretions or excretions on them.  Wear gloves when cleaning surfaces the patient has come in contact with.  Use a diluted bleach solution (e.g., dilute bleach with 1 part bleach and 10 parts water) or a household disinfectant with a label that says EPA-registered for coronaviruses. To make a bleach solution at home, add 1 tablespoon of bleach to 1 quart (4 cups) of water. For a larger  supply, add  cup of bleach to 1 gallon (16 cups) of water.  Read labels of cleaning products and follow recommendations provided on product labels. Labels contain instructions for safe and effective use of the cleaning product including precautions you should take when applying the product, such as wearing gloves or eye protection and making sure you have good ventilation during use of the product.  Remove gloves and wash hands immediately after cleaning.  Monitor yourself for signs and symptoms of illness Caregivers and household members are considered close contacts, should monitor their health, and will be asked to limit movement outside of the home to the extent possible. Follow the monitoring steps for close contacts listed on the symptom monitoring form.   ? If you have additional questions, contact your local health department or call the epidemiologist on call at [phone] (available 24/7). ? This guidance is subject to change. For the most up-to-date guidance from [facility], please refer to their website: [email or web address]

## 2020-12-18 DIAGNOSIS — Z6835 Body mass index (BMI) 35.0-35.9, adult: Secondary | ICD-10-CM

## 2020-12-18 DIAGNOSIS — E039 Hypothyroidism, unspecified: Secondary | ICD-10-CM

## 2020-12-18 LAB — CBC WITH DIFFERENTIAL/PLATELET
Abs Immature Granulocytes: 0.29 10*3/uL — ABNORMAL HIGH (ref 0.00–0.07)
Basophils Absolute: 0 10*3/uL (ref 0.0–0.1)
Basophils Relative: 0 %
Eosinophils Absolute: 0 10*3/uL (ref 0.0–0.5)
Eosinophils Relative: 0 %
HCT: 35.5 % — ABNORMAL LOW (ref 39.0–52.0)
Hemoglobin: 11.8 g/dL — ABNORMAL LOW (ref 13.0–17.0)
Immature Granulocytes: 2 %
Lymphocytes Relative: 8 %
Lymphs Abs: 1.2 10*3/uL (ref 0.7–4.0)
MCH: 27.8 pg (ref 26.0–34.0)
MCHC: 33.2 g/dL (ref 30.0–36.0)
MCV: 83.5 fL (ref 80.0–100.0)
Monocytes Absolute: 0.6 10*3/uL (ref 0.1–1.0)
Monocytes Relative: 4 %
Neutro Abs: 12.4 10*3/uL — ABNORMAL HIGH (ref 1.7–7.7)
Neutrophils Relative %: 86 %
Platelets: 458 10*3/uL — ABNORMAL HIGH (ref 150–400)
RBC: 4.25 MIL/uL (ref 4.22–5.81)
RDW: 14.2 % (ref 11.5–15.5)
WBC: 14.6 10*3/uL — ABNORMAL HIGH (ref 4.0–10.5)
nRBC: 0 % (ref 0.0–0.2)

## 2020-12-18 LAB — COMPREHENSIVE METABOLIC PANEL
ALT: 94 U/L — ABNORMAL HIGH (ref 0–44)
AST: 37 U/L (ref 15–41)
Albumin: 2.3 g/dL — ABNORMAL LOW (ref 3.5–5.0)
Alkaline Phosphatase: 51 U/L (ref 38–126)
Anion gap: 10 (ref 5–15)
BUN: 36 mg/dL — ABNORMAL HIGH (ref 8–23)
CO2: 25 mmol/L (ref 22–32)
Calcium: 8.9 mg/dL (ref 8.9–10.3)
Chloride: 102 mmol/L (ref 98–111)
Creatinine, Ser: 0.9 mg/dL (ref 0.61–1.24)
GFR, Estimated: >60 mL/min (ref >60)
Glucose, Bld: 179 mg/dL — ABNORMAL HIGH (ref 70–99)
Potassium: 4.8 mmol/L (ref 3.5–5.1)
Sodium: 137 mmol/L (ref 135–145)
Total Bilirubin: 0.7 mg/dL (ref 0.3–1.2)
Total Protein: 5.4 g/dL — ABNORMAL LOW (ref 6.5–8.1)

## 2020-12-18 LAB — FERRITIN: Ferritin: 544 ng/mL — ABNORMAL HIGH (ref 24–336)

## 2020-12-18 LAB — PHOSPHORUS: Phosphorus: 4.5 mg/dL (ref 2.5–4.6)

## 2020-12-18 LAB — D-DIMER, QUANTITATIVE: D-Dimer, Quant: 3.43 ug/mL-FEU — ABNORMAL HIGH (ref 0.00–0.50)

## 2020-12-18 LAB — C-REACTIVE PROTEIN: CRP: 2.9 mg/dL — ABNORMAL HIGH (ref <1.0)

## 2020-12-18 LAB — MAGNESIUM: Magnesium: 2.4 mg/dL (ref 1.7–2.4)

## 2020-12-18 NOTE — Plan of Care (Signed)

## 2020-12-18 NOTE — Progress Notes (Signed)
Physical Therapy Treatment Patient Details Name: Mitchell Humphrey MRN: [id number] DOB: 05-19-1941 Today's Date: 12/18/2020    History of Present Illness Mitchell Humphrey is a 80 y.o. male with medical history significant of obesity; hypothyroidism; HTN; HLD; and BPH presenting with worsening COVID symptoms. Phone calls documented in [place].  On 1/18, he had rhinorrhea and did a home COVID test which was positive.    PT Comments    Pt is progression well towards goals. He performed gait and seated LE ther ex on RA. SpO2 remained between 88-92% during activity on RA. Dropping once to ~83%, after 250 ft of ambulation and quickly recovering with seated rest break. Pt denied any SOB or dizziness associated with drop in sats. 1L O2 reapplied at end of session. Will continue to follow acutely for mobility progression. Current plan remains appropriate.    Follow Up Recommendations  No PT follow up     Equipment Recommendations  None recommended by PT    Recommendations for Other Services       Precautions / Restrictions Precautions Precautions: Other (comment) Precaution Comments: airborne    Mobility  Bed Mobility Overal bed mobility: Modified Independent             General bed mobility comments: up in bed on arrival  Transfers Overall transfer level: Needs assistance Equipment used: None Transfers: Sit to/from Stand Sit to Stand: Supervision         General transfer comment: supervision for safety, Use of arm rests, but no external assist required.  Ambulation/Gait Ambulation/Gait assistance: Supervision Gait Distance (Feet): 250 Feet Assistive device: None Gait Pattern/deviations: Step-through pattern Gait velocity: normal cadence   General Gait Details: Ambulated laps in room. Normal gait pattern, ambulated without O2 donned, sats between 88-92%, droppping after 250 ft to 83%. Pt denies any SOB. Had pt return to seated position to allow SpO2 to recover on RA.   Stairs              Wheelchair Mobility    Modified Rankin (Stroke Patients Only)       Balance Overall balance assessment: Independent                                          Cognition Arousal/Alertness: Awake/alert Behavior During Therapy: WFL for tasks assessed/performed Overall Cognitive Status: Within Functional Limits for tasks assessed                                        Exercises General Exercises - Lower Extremity Ankle Circles/Pumps: AROM;Both;10 reps;Seated Long Arc Quad: AROM;Both;10 reps;Seated Heel Slides: AROM;Both;10 reps;Seated Hip ABduction/ADduction: AROM;Both;10 reps;Seated Hip Flexion/Marching: AROM;Both;10 reps;Seated    General Comments        Pertinent Vitals/Pain Pain Assessment: No/denies pain    Home Living                      Prior Function            PT Goals (current goals can now be found in the care plan section) Acute Rehab PT Goals Patient Stated Goal: to feel better PT Goal Formulation: With patient Time For Goal Achievement: 12/20/20 Potential to Achieve Goals: Good Progress towards PT goals: Progressing toward goals    Frequency    Min  2X/week      PT Plan      Co-evaluation              AM-PAC PT "6 Clicks" Mobility   Outcome Measure  Help needed turning from your back to your side while in a flat bed without using bedrails?: None Help needed moving from lying on your back to sitting on the side of a flat bed without using bedrails?: None Help needed moving to and from a bed to a chair (including a wheelchair)?: A Little Help needed standing up from a chair using your arms (e.g., wheelchair or bedside chair)?: None Help needed to walk in hospital room?: A Little Help needed climbing 3-5 steps with a railing? : A Little 6 Click Score: 21    End of Session Equipment Utilized During Treatment: Oxygen Activity Tolerance: Patient tolerated treatment  well Patient left: with call bell/phone within reach;in chair Nurse Communication: Other (comment);Mobility status (O2 on RA) PT Visit Diagnosis: Muscle weakness (generalized) (M62.81)     Time: 1610-9604 PT Time Calculation (min) (ACUTE ONLY): 26 min  Charges:  $Gait Training: 8-22 mins $Therapeutic Exercise: 8-22 mins                    Benjiman Core, [place] Pager [phone] Acute Rehab   Allena Katz 12/18/2020, 1:50 PM

## 2020-12-18 NOTE — Progress Notes (Signed)
PROGRESS NOTE                                                                             PROGRESS NOTE                                                                                                                                                                                                             Patient Demographics:    Mitchell Humphrey, is a 80 y.o. male, DOB - 1941-03-07, TKZ:[id number]  Outpatient Primary MD for the patient is Inda Coke, [place]    LOS - 3  Admit date - 12/15/2020    Chief Complaint  Patient presents with  . Covid Positive  . Shortness of Breath       Brief Narrative    80 y.o. 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   Assessment  & Plan :    Acute Hypoxic Resp. Failure due to COVID-19 pneumonia: Improving rapidly-Down to 2 L of oxygen-continue steroids/baricitinib and Remdesivir.  Will need to be assessed for home O2 requirement.  Suspect that if clinical improvement continues-Home in the next day or so.    Left lower extremity DVT: Continue Eliquis-provoked VTE-and requires 3 months of uninterrupted anticoagulation.  Transaminitis: Due to COVID-19 infection-mild-continue periodic follow-up.  HTN: BP stable-continue HCTZ and enalapril.  HLD: Continue  Lipitor  Hypothyroidism: TSH stable this admission-continue Synthroid.  Follow with PCP  TDD:UKGURKYH Proscar, Myrbetriq, Flomax  Right Adrenal Adenoma:incidental finding-stable for outpatient work up by PCP  Obesity: -BMI  is 37.2 -Weight loss should be encouraged -Outpatient PCP/bariatric medicine f/u encouraged  Condition -  Guarded  Family Communication  :  Wife-[phone] by phone on 2/2  Code Status :  full  Consults  :  none  Status is: Inpatient  Remains inpatient appropriate because:IV treatments appropriate due to intensity of illness or inability to take PO   Dispo: The patient is from: Home              Anticipated d/c is to: Home              Anticipated d/c date is: > 1-2 days              Patient currently is not medically stable to d/c.   Difficult to place patient No   Lab Results  Component Value Date   PLT 458 (H) 12/18/2020    Diet :  Diet Order            Diet regular Room service appropriate? Yes; Fluid consistency: Thin  Diet effective now                  Inpatient Medications  Scheduled Meds: . apixaban  10 mg Oral BID   Followed by  . [START ON 12/24/2020] apixaban  5 mg Oral BID  . vitamin C  500 mg Oral Daily  . atorvastatin  20 mg Oral q1800  . baricitinib  4 mg Oral Daily  . docusate sodium  100 mg Oral BID  . enalapril  10 mg Oral Daily  . famotidine  20 mg Oral Daily  . feeding supplement  237 mL Oral TID BM  . finasteride  5 mg Oral Daily  . fluticasone  2 spray Each Nare Daily  . hydrochlorothiazide  25 mg Oral Daily  . levothyroxine  150 mcg Oral Q0600  . loratadine  10 mg Oral Daily  . methylPREDNISolone (SOLU-MEDROL) injection  60 mg Intravenous Q12H  . mirabegron ER  25 mg Oral Daily  . multivitamin with minerals  1 tablet Oral Daily  . pantoprazole  40 mg Oral Daily  . sodium chloride flush  3 mL Intravenous Q12H  . tamsulosin  0.4 mg Oral Daily  . zinc sulfate  220 mg Oral Daily   Continuous  Infusions: . sodium chloride    . remdesivir 100 mg in NS 100 mL 100 mg (12/18/20 0956)   PRN Meds:.sodium chloride, acetaminophen, albuterol, bisacodyl, chlorpheniramine-HYDROcodone, guaiFENesin-dextromethorphan, ondansetron **OR** ondansetron (ZOFRAN) IV, oxyCODONE, polyethylene glycol, sodium chloride flush, sodium phosphate  Antibiotics  :    Anti-infectives (From admission, onward)   Start     Dose/Rate Route Frequency Ordered Stop   12/16/20 1000  remdesivir 100 mg in sodium chloride 0.9 % 100 mL IVPB       "Followed by" Linked Group Details   100 mg 200 mL/hr over 30 Minutes Intravenous Daily 12/15/20 1157 12/20/20 0959   12/15/20 1200  remdesivir 200 mg in sodium chloride 0.9% 250 mL IVPB       "Followed by" Linked Group Details   200 mg 580 mL/hr over 30 Minutes Intravenous Once 12/15/20 1157 12/15/20 1715        Nels Munn M.D on 12/18/2020 at 10:55 AM  To page go to [email or web address]   [facility] -  Office  [phone]     Objective:   Vitals:   12/18/20 0013 12/18/20 0446 12/18/20 0700 12/18/20 0949  BP: (!) 144/61 (!) 131/56 (!) 135/41 129/77  Pulse: 69 60 77 73  Resp: 19 18 16 18   Temp: 98.5 F (36.9 C) 97.8 F (36.6 C)    TempSrc: Oral Oral    SpO2: 97% 97% 94% 95%  Weight:      Height:        Wt Readings from Last 3 Encounters:  12/16/20 109.5 kg  12/04/20 111.1 kg  10/23/20 116.7 kg     Intake/Output Summary (Last 24 hours) at 12/18/2020 1055 Last data filed at 12/18/2020 1000 Gross per 24 hour  Intake 300 ml  Output --  Net 300 ml     Physical Exam Gen Exam:Alert awake-not in any distress HEENT:atraumatic, normocephalic Chest: B/L clear to auscultation anteriorly CVS:S1S2 regular Abdomen:soft non tender, non distended Extremities:no edema Neurology: Non focal Skin: no rash    Data Review:    CBC Recent Labs  Lab 12/15/20 1027 12/16/20 0313 12/17/20 0219 12/18/20 0230  WBC 10.3 9.6 14.8* 14.6*  HGB 12.8* 12.7*  12.6* 11.8*  HCT 39.8 39.7 38.0* 35.5*  PLT 459* 425* 451* 458*  MCV 82.2 84.3 82.6 83.5  MCH 26.4 27.0 27.4 27.8  MCHC 32.2 32.0 33.2 33.2  RDW 13.8 13.8 14.2 14.2  LYMPHSABS  --  0.7 1.3 1.2  MONOABS  --  0.2 0.7 0.6  EOSABS  --  0.0 0.0 0.0  BASOSABS  --  0.0 0.0 0.0    Recent Labs  Lab 12/15/20 1027 12/15/20 1239 12/15/20 1240 12/16/20 0313 12/17/20 0219 12/18/20 0230  NA 134*  --   --  135 139 137  K 4.0  --   --  4.3 4.9 4.8  CL 100  --   --  101 102 102  CO2 23  --   --  22 27 25   GLUCOSE 153*  --   --  183* 184* 179*  BUN 18  --   --  24* 30* 36*  CREATININE 0.88  --   --  0.77 0.81 0.90  CALCIUM 8.6*  --   --  8.6* 9.5 8.9  AST  --   --   --  57* 74* 37  ALT  --   --   --  91* 125* 94*  ALKPHOS  --   --   --  56 54 51  BILITOT  --   --   --  0.4 0.6 0.7  ALBUMIN  --   --   --  2.4* 2.4* 2.3*  MG  --   --   --  2.2 2.4 2.4  CRP  --   --  19.6* 18.0* 7.9* 2.9*  DDIMER  --   --  17.73* 15.00* 4.60* 3.43*  PROCALCITON  --   --  <0.10  --   --   --   LATICACIDVEN  --  1.9  --   --   --   --   TSH  --   --   --  0.681  --   --     ------------------------------------------------------------------------------------------------------------------ Recent Labs    12/15/20 1239  TRIG 121    No results found for: HGBA1C ------------------------------------------------------------------------------------------------------------------ Recent Labs    12/16/20 0313  TSH 0.681    Cardiac Enzymes No results for input(s): CKMB, TROPONINI, MYOGLOBIN in the last 168 hours.  Invalid input(s): CK ------------------------------------------------------------------------------------------------------------------ No results found for: BNP  Micro Results Recent Results (from the past 240 hour(s))  SARS Coronavirus 2 by RT PCR (hospital order, performed in [facility] hospital lab)  Nasopharyngeal Nasopharyngeal Swab     Status: Abnormal   Collection Time: 12/15/20  2:19  PM   Specimen: Nasopharyngeal Swab  Result Value Ref Range Status   SARS Coronavirus 2 POSITIVE (A) NEGATIVE Final    Comment: RESULT CALLED TO, READ BACK BY AND VERIFIED WITH: Trudi Ida RN 12/15/20 AT 1525 SK  (NOTE) SARS-CoV-2 target nucleic acids are DETECTED  SARS-CoV-2 RNA is generally detectable in upper respiratory specimens  during the acute phase of infection.  Positive results are indicative  of the presence of the identified virus, but do not rule out bacterial infection or co-infection with other pathogens not detected by the test.  Clinical correlation with patient history and  other diagnostic information is necessary to determine patient infection status.  The expected result is negative.  Fact Sheet for Patients:   StrictlyIdeas.no   Fact Sheet for Healthcare Providers:   BankingDealers.co.za    This test is not yet approved or cleared by the [place] FDA and  has been authorized for detection and/or diagnosis of SARS-CoV-2 by FDA under an Emergency Use Authorization (EUA).  This EUA will remain in effect (meaning this tes t can be used) for the duration of  the COVID-19 declaration under Section 564(b)(1) of the Act, 21 U.S.C. section 360-bbb-3(b)(1), unless the authorization is terminated or revoked sooner.  Performed at [facility], [place] [address], [place], [place] [address]     Radiology Reports CT ANGIO CHEST PE W OR WO CONTRAST  Result Date: 12/15/2020 CLINICAL DATA:  Shortness of breath and hypoxia. COVID-19 positive. Myalgias. EXAM: CT ANGIOGRAPHY CHEST WITH CONTRAST TECHNIQUE: Multidetector CT imaging of the chest was performed using the standard protocol during bolus administration of intravenous contrast. Multiplanar CT image reconstructions and MIPs were obtained to evaluate the vascular anatomy. CONTRAST:  63mL OMNIPAQUE IOHEXOL 350 MG/ML SOLN COMPARISON:  Portable chest obtained earlier  today. FINDINGS: Cardiovascular: Mildly enlarged heart. Normally opacified pulmonary arteries with no pulmonary arterial filling defects seen. Mediastinum/Nodes: Mildly enlarged subcarinal nodes. The largest has a short axis diameter of 11 mm on image number 48 series 5. There is also a mildly enlarged right paratracheal node with a short axis diameter 10 mm on image number 15 series 5. Unremarkable thyroid gland.  No visible esophageal abnormality. Lungs/Pleura: Large number of focal patchy opacities in both lungs, involving all lobes. Small left pleural effusion and minimal right pleural effusion. Upper Abdomen: Multiple surgical clips in the region of the gastroesophageal junction. 2.6 cm oval mass in the right adrenal gland with a mean density of 4 Hounsfield units on image number 88 series 5. Accessory splenule inferior to the spleen. Musculoskeletal: Thoracic and lower cervical spine degenerative changes. Review of the MIP images confirms the above findings. IMPRESSION: 1. No pulmonary emboli. 2. Large number of focal patchy opacities in both lungs, involving all lobes, compatible with COVID-19 pneumonia. 3. Small left pleural effusion and minimal right pleural effusion. 4. Mild mediastinal adenopathy, most likely reactive. 5. 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 Electronically Signed   By: Claudie Revering M.D.   On: 12/15/2020 14:27   DG Chest Portable 1 View  Result Date: 12/15/2020 CLINICAL DATA:  Shortness of breath EXAM: PORTABLE CHEST 1 VIEW COMPARISON:  Chest x-rays dated 12/07/2020 and 04/10/2008 FINDINGS: Worsening bilateral airspace opacities, LEFT slightly greater than RIGHT. No pleural effusion or pneumothorax is seen. Heart size and mediastinal contours are stable. IMPRESSION: Worsening bilateral multifocal pneumonia. Electronically Signed   By: Franki Cabot  M.D.   On: 12/15/2020 10:36   VAS [place] LOWER EXTREMITY VENOUS (DVT)  Result Date: 12/16/2020  Lower Venous DVT Study Indications: Elevated  Ddimer.  Risk Factors: COVID 19 positive. Comparison Study: No prior studies. Performing Technologist: Oliver Hum RVT  Examination Guidelines: A complete evaluation includes B-mode imaging, spectral Doppler, color Doppler, and power Doppler as needed of all accessible portions of each vessel. Bilateral testing is considered an integral part of a complete examination. Limited examinations for reoccurring indications may be performed as noted. The reflux portion of the exam is performed with the patient in reverse Trendelenburg.  +---------+---------------+---------+-----------+----------+--------------+ RIGHT    CompressibilityPhasicitySpontaneityPropertiesThrombus Aging +---------+---------------+---------+-----------+----------+--------------+ CFV      Full           Yes      Yes                                 +---------+---------------+---------+-----------+----------+--------------+ SFJ      Full                                                        +---------+---------------+---------+-----------+----------+--------------+ FV Prox  Full                                                        +---------+---------------+---------+-----------+----------+--------------+ FV Mid   Full                                                        +---------+---------------+---------+-----------+----------+--------------+ FV DistalFull                                                        +---------+---------------+---------+-----------+----------+--------------+ PFV      Full                                                        +---------+---------------+---------+-----------+----------+--------------+ POP      Full           Yes      Yes                                 +---------+---------------+---------+-----------+----------+--------------+ PTV      Full                                                         +---------+---------------+---------+-----------+----------+--------------+ PERO  Full                                                        +---------+---------------+---------+-----------+----------+--------------+   +---------+---------------+---------+-----------+----------+--------------+ LEFT     CompressibilityPhasicitySpontaneityPropertiesThrombus Aging +---------+---------------+---------+-----------+----------+--------------+ CFV      Full           Yes      Yes                                 +---------+---------------+---------+-----------+----------+--------------+ SFJ      Full                                                        +---------+---------------+---------+-----------+----------+--------------+ FV Prox  Full                                                        +---------+---------------+---------+-----------+----------+--------------+ FV Mid   Full                                                        +---------+---------------+---------+-----------+----------+--------------+ FV DistalFull                                                        +---------+---------------+---------+-----------+----------+--------------+ PFV      Full                                                        +---------+---------------+---------+-----------+----------+--------------+ POP      Full           Yes      Yes                                 +---------+---------------+---------+-----------+----------+--------------+ PTV      Full                                                        +---------+---------------+---------+-----------+----------+--------------+ PERO     Full                                                        +---------+---------------+---------+-----------+----------+--------------+  Gastroc  Partial                                      Acute           +---------+---------------+---------+-----------+----------+--------------+     Summary: RIGHT: - There is no evidence of deep vein thrombosis in the lower extremity.  - No cystic structure found in the popliteal fossa.  LEFT: - Findings consistent with acute deep vein thrombosis involving the left gastrocnemius veins. - No cystic structure found in the popliteal fossa.  *See table(s) above for measurements and observations. Electronically signed by Monica Martinez MD on 12/16/2020 at 3:57:02 PM.    Final

## 2020-12-19 ENCOUNTER — Other Ambulatory Visit (HOSPITAL_COMMUNITY): Payer: Self-pay | Admitting: Internal Medicine

## 2020-12-19 DIAGNOSIS — E785 Hyperlipidemia, unspecified: Secondary | ICD-10-CM

## 2020-12-19 DIAGNOSIS — G9340 Encephalopathy, unspecified: Secondary | ICD-10-CM

## 2020-12-19 LAB — CBC WITH DIFFERENTIAL/PLATELET
Abs Immature Granulocytes: 0.49 10*3/uL — ABNORMAL HIGH (ref 0.00–0.07)
Basophils Absolute: 0 10*3/uL (ref 0.0–0.1)
Basophils Relative: 0 %
Eosinophils Absolute: 0 10*3/uL (ref 0.0–0.5)
Eosinophils Relative: 0 %
HCT: 36.7 % — ABNORMAL LOW (ref 39.0–52.0)
Hemoglobin: 11.7 g/dL — ABNORMAL LOW (ref 13.0–17.0)
Immature Granulocytes: 3 %
Lymphocytes Relative: 7 %
Lymphs Abs: 1.1 10*3/uL (ref 0.7–4.0)
MCH: 26.6 pg (ref 26.0–34.0)
MCHC: 31.9 g/dL (ref 30.0–36.0)
MCV: 83.4 fL (ref 80.0–100.0)
Monocytes Absolute: 0.6 10*3/uL (ref 0.1–1.0)
Monocytes Relative: 4 %
Neutro Abs: 12.6 10*3/uL — ABNORMAL HIGH (ref 1.7–7.7)
Neutrophils Relative %: 86 %
Platelets: 424 10*3/uL — ABNORMAL HIGH (ref 150–400)
RBC: 4.4 MIL/uL (ref 4.22–5.81)
RDW: 13.8 % (ref 11.5–15.5)
WBC: 14.8 10*3/uL — ABNORMAL HIGH (ref 4.0–10.5)
nRBC: 0 % (ref 0.0–0.2)

## 2020-12-19 LAB — COMPREHENSIVE METABOLIC PANEL
ALT: 109 U/L — ABNORMAL HIGH (ref 0–44)
AST: 43 U/L — ABNORMAL HIGH (ref 15–41)
Albumin: 2.4 g/dL — ABNORMAL LOW (ref 3.5–5.0)
Alkaline Phosphatase: 47 U/L (ref 38–126)
Anion gap: 9 (ref 5–15)
BUN: 34 mg/dL — ABNORMAL HIGH (ref 8–23)
CO2: 26 mmol/L (ref 22–32)
Calcium: 8.8 mg/dL — ABNORMAL LOW (ref 8.9–10.3)
Chloride: 99 mmol/L (ref 98–111)
Creatinine, Ser: 0.95 mg/dL (ref 0.61–1.24)
GFR, Estimated: >60 mL/min (ref >60)
Glucose, Bld: 230 mg/dL — ABNORMAL HIGH (ref 70–99)
Potassium: 4.6 mmol/L (ref 3.5–5.1)
Sodium: 134 mmol/L — ABNORMAL LOW (ref 135–145)
Total Bilirubin: 0.5 mg/dL (ref 0.3–1.2)
Total Protein: 5.2 g/dL — ABNORMAL LOW (ref 6.5–8.1)

## 2020-12-19 LAB — D-DIMER, QUANTITATIVE: D-Dimer, Quant: 2.61 ug/mL-FEU — ABNORMAL HIGH (ref 0.00–0.50)

## 2020-12-19 LAB — C-REACTIVE PROTEIN: CRP: 1.4 mg/dL — ABNORMAL HIGH (ref <1.0)

## 2020-12-19 MED ORDER — ALBUTEROL SULFATE HFA 108 (90 BASE) MCG/ACT IN AERS: 2.0000 | INHALATION_SPRAY | Freq: Four times a day (QID) | RESPIRATORY_TRACT | 0 refills | Status: DC | PRN

## 2020-12-19 MED ORDER — APIXABAN (ELIQUIS) VTE STARTER PACK (10MG AND 5MG): ORAL_TABLET | ORAL | 0 refills | Status: DC

## 2020-12-19 MED ORDER — BENZONATATE 100 MG PO CAPS: 100.0000 mg | ORAL_CAPSULE | Freq: Four times a day (QID) | ORAL | 0 refills | Status: DC | PRN

## 2020-12-19 MED ORDER — PREDNISONE 10 MG PO TABS: ORAL_TABLET | ORAL | 0 refills | Status: DC

## 2020-12-19 MED FILL — ELIQUIS STARTER PACK 5 MG T: 5 | 30 days supply | Qty: 74 | Fill #0

## 2020-12-19 MED FILL — predniSONE 10 MG TABS: 10 | 3 days supply | Qty: 10 | Fill #0

## 2020-12-19 MED FILL — BENZONATATE 100 MG CAPS: 100 | 8 days supply | Qty: 30 | Fill #0

## 2020-12-19 NOTE — TOC Initial Note (Signed)
Transition of Care [facility]) - Initial/Assessment Note    Patient Details  Name: Mitchell Humphrey MRN: [id number] Date of Birth: [birth year]  [facility] CM/SW Contact:    Bethena Roys, RN Phone Number: 12/19/2020, 10:47 AM  Clinical Narrative:  Patient presented for acute hypoxic respiratory failure. Prior to arrival patient was from home with spouse. Plan will be to return home with oxygen support today. Case Manager spoke with patient regarding oxygen and he is agreeable to have Case Manager call [employer] for services. Referral made with [employer] and the portable oxygen concentrator will be delivered to the room prior to transition home. [place] will have medications delivered to the bedside. No further needs from Case Manager at this time.             Expected Discharge Plan: Home/Self Care Barriers to Discharge: No Barriers Identified   Patient Goals and CMS Choice Patient states their goals for this hospitalization and ongoing recovery are:: to return home   Choice offered to / list presented to : NA  Expected Discharge Plan and Services Expected Discharge Plan: Home/Self Care In-house Referral: NA Discharge Planning Services: CM Consult Post Acute Care Choice: Durable Medical Equipment Living arrangements for the past 2 months: Single Family Home Expected Discharge Date: 12/19/20               DME Arranged: Oxygen DME Agency: [employer] Date DME Agency Contacted: 12/19/20 Time DME Agency Contacted: 1000 Representative spoke with at DME Agency: [place]: NA        Prior Living Arrangements/Services Living arrangements for the past 2 months: [place] Lives with:: Spouse Patient language and need for interpreter reviewed:: Yes Do you feel safe going back to the place where you live?: Yes      Need for Family Participation in Patient Care: Yes (Comment) Care giver support system in place?: Yes (comment)   Criminal Activity/Legal  Involvement Pertinent to Current Situation/Hospitalization: No - Comment as needed  Activities of Daily Living Home Assistive Devices/Equipment: None,Scales,Blood pressure cuff ADL Screening (condition at time of admission) Patient's cognitive ability adequate to safely complete daily activities?: Yes Is the patient deaf or have difficulty hearing?: No Does the patient have difficulty seeing, even when wearing glasses/contacts?: No Does the patient have difficulty concentrating, remembering, or making decisions?: No Patient able to express need for assistance with ADLs?: Yes Does the patient have difficulty dressing or bathing?: No Independently performs ADLs?: Yes (appropriate for developmental age) Does the patient have difficulty walking or climbing stairs?: No Weakness of Legs: None Weakness of Arms/Hands: None  Permission Sought/Granted Permission sought to share information with : Family Supports,Case Pensions consultant granted to share info w AGENCY: [employer]        Emotional Assessment Appearance:: Appears stated age Attitude/Demeanor/Rapport: Engaged Affect (typically observed): Appropriate Orientation: : Oriented to Self,Oriented to Place,Oriented to  Time,Oriented to Situation Alcohol / Substance Use: Not Applicable Psych Involvement: No (comment)  Admission diagnosis:  Acute respiratory failure with hypoxia ([place]) [J96.01] Acute encephalopathy [G93.40] Acute hypoxemic respiratory failure due to COVID-19 ([place]) [U07.1, J96.01] Patient Active Problem List   Diagnosis Date Noted  . Acute hypoxemic respiratory failure due to COVID-19 ([place]) 12/15/2020  . COVID-19 virus infection 12/07/2020  . Insulin resistance 10/23/2020  . Severe obesity (BMI 35.0-35.9 with comorbidity) ([place]) 08/05/2020  . 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 GERD (gastroesophageal reflux disease) 12/20/2018  .  Chronic low back pain 05/18/2018  .  Hypercholesterolemia 08/09/2017  . BPH (benign prostatic hyperplasia) 08/09/2017  . Osteoarthritis of both hands 08/09/2017  . 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 History of melanoma 02/04/2015  . Elevated prostate specific antigen (PSA) 03/15/2013  . Increased frequency of urination 03/15/2013  . Urge incontinence 03/15/2013  . Benign essential hypertension 05/25/2012  . Hypothyroidism (acquired) 05/25/2012  . Dyslipidemia 05/25/2012  . 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

## 2020-12-19 NOTE — Progress Notes (Addendum)
SATURATION QUALIFICATIONS: (This note is used to comply with regulatory documentation for home oxygen)  Patient Saturations on Room Air at Rest = 92%  Patient Saturations on Room Air while Ambulating = 85  Patient Saturations on 3 Liters of oxygen while Ambulating =93%  Please briefly explain why patient needs home oxygen:  Drop in o2 sats when ambulating on RA

## 2020-12-19 NOTE — Discharge Summary (Addendum)
PATIENT DETAILS Name: Mitchell Humphrey Age: 80 y.o. Sex: male Date of Birth: 1941-06-03 MRN: [id number]. Admitting Physician: Karmen Bongo, MD [place], [place], [place]  Admit Date: 12/15/2020 Discharge date: 12/19/2020  Recommendations for Outpatient Follow-up:  1. Follow up with PCP in 1-2 weeks 2. Please obtain CMP/CBC in one week 3. Repeat Chest Xray in 4-6 week 4. Please reassess at next visit whether patient still requires home O2. 5. Incidental finding-right adrenal adenoma-work-up deferred to PCP. 6. Incidental finding-mediastinal adenopathy-likely reactive-PCP to repeat chest imaging in 4 to 6 weeks.  Admitted From:  Home  Disposition: Poca: No  Equipment/Devices: Oxygen-2-3 L-mostly with ambulation.  Discharge Condition: Stable  CODE STATUS: FULL CODE  Diet recommendation:  Diet Order            Diet - low sodium heart healthy           Diet regular Room service appropriate? 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 Failure due to COVID-19 pneumonia: Significantly better-treated with steroids/baricitinib and Remdesivir.  On room air at rest-but requires around 2-2 L of oxygen with ambulation.  Per patient-he really does not get dyspneic with movement-and is  anxious to go home today.  He will complete his last dose of Remdesivir today-follow which she will be discharged home on tapering steroids.  Home O2 will be arranged-PCP to assess at next visit whether he still requires home O2.     Left lower extremity DVT: Continue Eliquis-provoked VTE-and requires 3 months of uninterrupted anticoagulation.  Transaminitis: Due to COVID-19 infection-mild-continue periodic follow-up.  HTN: BP stable-continue HCTZ and enalapril.  HLD: Continue Lipitor  Hypothyroidism: TSH stable this admission-continue Synthroid.  Follow with PCP  [facility]:NTIRWERX Proscar, Myrbetriq, Flomax  Right Adrenal Adenoma:incidental finding-stable for outpatient work up by PCP  Obesity: -BMI is37.2 -Weight loss should be encouraged -Outpatient PCP/bariatric medicine f/u encouraged   Discharge Diagnoses:  Principal Problem:   Acute hypoxemic respiratory failure due to COVID-19 [facility]) Active Problems:   Benign essential hypertension   Hypothyroidism (acquired)   Dyslipidemia   Severe obesity (BMI 35.0-35.9 with comorbidity) [facility])   Discharge Instructions:    Person Under Monitoring Name: Mitchell Humphrey  Location: [address] [place] [address]   Infection Prevention Recommendations for Individuals Confirmed to have, or Being Evaluated for, 2019 Novel Coronavirus (COVID-19) Infection Who Receive Care at Home  Individuals who are confirmed to have, or are being evaluated for, COVID-19 should follow the prevention steps below until a healthcare provider or local or state health department says they can return to normal activities.  Stay home except to get medical care You should restrict activities outside your home, except for getting medical care. Do not go to work, school, or public areas, and do not use public transportation or taxis.  Call ahead before visiting your doctor Before  your medical appointment, call the healthcare provider and tell  them that you have, or are being evaluated for, COVID-19 infection. This will help the healthcare provider's office take steps to keep other people from getting infected. Ask your healthcare provider to call the local or state health department.  Monitor your symptoms Seek prompt medical attention if your illness is worsening (e.g., difficulty breathing). Before going to your medical appointment, call the healthcare provider and tell them that you have, or are being evaluated for, COVID-19 infection. Ask your healthcare provider to call the local or state health department.  Wear a facemask You should wear a facemask that covers your nose and mouth when you are in the same room with other people and when you visit a healthcare provider. People who live with or visit you should also wear a facemask while they are in the same room with you.  Separate yourself from other people in your home As much as possible, you should stay in a different room from other people in your home. Also, you should use a separate bathroom, if available.  Avoid sharing household items You should not share dishes, drinking glasses, cups, eating utensils, towels, bedding, or other items with other people in your home. After using these items, you should wash them thoroughly with soap and water.  Cover your coughs and sneezes Cover your mouth and nose with a tissue when you cough or sneeze, or you can cough or sneeze into your sleeve. Throw used tissues in a lined trash can, and immediately wash your hands with soap and water for at least 20 seconds or use an alcohol-based hand rub.  Wash your [facility] your hands often and thoroughly with soap and water for at least 20 seconds. You can use an alcohol-based hand sanitizer if soap and water are not available and if your hands are not visibly dirty. Avoid touching your eyes, nose, and mouth with unwashed hands.   Prevention Steps for Caregivers and Household  Members of Individuals Confirmed to have, or Being Evaluated for, COVID-19 Infection Being Cared for in the Home  If you live with, or provide care at home for, a person confirmed to have, or being evaluated for, COVID-19 infection please follow these guidelines to prevent infection:  Follow healthcare provider's instructions Make sure that you understand and can help the patient follow any healthcare provider instructions for all care.  Provide for the patient's basic needs You should help the patient with basic needs in the home and provide support for getting groceries, prescriptions, and other personal needs.  Monitor the patient's symptoms If they are getting sicker, call his or her medical provider and tell them that the patient has, or is being evaluated for, COVID-19 infection. This will help the healthcare provider's office take steps to keep other people from getting infected. Ask the healthcare provider to call the local or state health department.  Limit the number of people who have contact with the patient  If possible, have only one caregiver for the patient.  Other household members should stay in another home or place of residence. If this is not possible, they should stay  in another room, or be separated from the patient as much as possible. Use a separate bathroom, if available.  Restrict visitors who do not have an essential need to be in the home.  Keep older adults, very young children, and other sick people away from the patient Keep older adults, very young children, and  those who have compromised immune systems or chronic health conditions away from the patient. This includes people with chronic heart, lung, or kidney conditions, diabetes, and cancer.  Ensure good ventilation Make sure that shared spaces in the home have good air flow, such as from an air conditioner or an opened window, weather permitting.  Wash your hands often  Wash your hands often  and thoroughly with soap and water for at least 20 seconds. You can use an alcohol based hand sanitizer if soap and water are not available and if your hands are not visibly dirty.  Avoid touching your eyes, nose, and mouth with unwashed hands.  Use disposable paper towels to dry your hands. If not available, use dedicated cloth towels and replace them when they become wet.  Wear a facemask and gloves  Wear a disposable facemask at all times in the room and gloves when you touch or have contact with the patient's blood, body fluids, and/or secretions or excretions, such as sweat, saliva, sputum, nasal mucus, vomit, urine, or feces.  Ensure the mask fits over your nose and mouth tightly, and do not touch it during use.  Throw out disposable facemasks and gloves after using them. Do not reuse.  Wash your hands immediately after removing your facemask and gloves.  If your personal clothing becomes contaminated, carefully remove clothing and launder. Wash your hands after handling contaminated clothing.  Place all used disposable facemasks, gloves, and other waste in a lined container before disposing them with other household waste.  Remove gloves and wash your hands immediately after handling these items.  Do not share dishes, glasses, or other household items with the patient  Avoid sharing household items. You should not share dishes, drinking glasses, cups, eating utensils, towels, bedding, or other items with a patient who is confirmed to have, or being evaluated for, COVID-19 infection.  After the person uses these items, you should wash them thoroughly with soap and water.  Wash laundry thoroughly  Immediately remove and wash clothes or bedding that have blood, body fluids, and/or secretions or excretions, such as sweat, saliva, sputum, nasal mucus, vomit, urine, or feces, on them.  Wear gloves when handling laundry from the patient.  Read and follow directions on labels of  laundry or clothing items and detergent. In general, wash and dry with the warmest temperatures recommended on the label.  Clean all areas the individual has used often  Clean all touchable surfaces, such as counters, tabletops, doorknobs, bathroom fixtures, toilets, phones, keyboards, tablets, and bedside tables, every day. Also, clean any surfaces that may have blood, body fluids, and/or secretions or excretions on them.  Wear gloves when cleaning surfaces the patient has come in contact with.  Use a diluted bleach solution (e.g., dilute bleach with 1 part bleach and 10 parts water) or a household disinfectant with a label that says EPA-registered for coronaviruses. To make a bleach solution at home, add 1 tablespoon of bleach to 1 quart (4 cups) of water. For a larger supply, add  cup of bleach to 1 gallon (16 cups) of water.  Read labels of cleaning products and follow recommendations provided on product labels. Labels contain instructions for safe and effective use of the cleaning product including precautions you should take when applying the product, such as wearing gloves or eye protection and making sure you have good ventilation during use of the product.  Remove gloves and wash hands immediately after cleaning.  Monitor yourself for signs and symptoms  of illness Caregivers and household members are considered close contacts, should monitor their health, and will be asked to limit movement outside of the home to the extent possible. Follow the monitoring steps for close contacts listed on the symptom monitoring form.   ? If you have additional questions, contact your local health department or call the epidemiologist on call at [phone] (available 24/7). ? This guidance is subject to change. For the most up-to-date guidance from CDC, please refer to their website: YouBlogs.pl    Activity:  As tolerated Discharge  Instructions    Call MD for:  difficulty breathing, headache or visual disturbances   Complete by: As directed    Diet - low sodium heart healthy   Complete by: As directed    Discharge instructions   Complete by: As directed    Follow with Primary MD  Inda Coke, PA in 1-2 weeks  Please get a complete blood count and chemistry panel checked by your Primary MD at your next visit, and again as instructed by your Primary MD.  Get Medicines reviewed and adjusted: Please take all your medications with you for your next visit with your Primary MD  Laboratory/radiological data: Please request your Primary MD to go over all hospital tests and procedure/radiological results at the follow up, please ask your Primary MD to get all Hospital records sent to his/her office.  In some cases, they will be blood work, cultures and biopsy results pending at the time of your discharge. Please request that your primary care M.D. follows up on these results.  Also Note the following: If you experience worsening of your admission symptoms, develop shortness of breath, life threatening emergency, suicidal or homicidal thoughts you must seek medical attention immediately by calling 911 or calling your MD immediately  if symptoms less severe.  You must read complete instructions/literature along with all the possible adverse reactions/side effects for all the Medicines you take and that have been prescribed to you. Take any new Medicines after you have completely understood and accpet all the possible adverse reactions/side effects.   Do not drive when taking Pain medications or sleeping medications (Benzodaizepines)  Do not take more than prescribed Pain, Sleep and Anxiety Medications. It is not advisable to combine anxiety,sleep and pain medications without talking with your primary care practitioner  Special Instructions: If you have smoked or chewed Tobacco  in the last 2 yrs please stop smoking, stop  any regular Alcohol  and or any Recreational drug use.  Wear Seat belts while driving.  Please note: You were cared for by a hospitalist during your hospital stay. Once you are discharged, your primary care physician will handle any further medical issues. Please note that NO REFILLS for any discharge medications will be authorized once you are discharged, as it is imperative that you return to your primary care physician (or establish a relationship with a primary care physician if you do not have one) for your post hospital discharge needs so that they can reassess your need for medications and monitor your lab values.   1.)  21 days of isolation from 1/18 (day of first positive home Covid test)  2.)  Please ask your primary care practitioner to reassess whether you still require home O2 at next visit.  3.)  Use home O2 24/7 as instructed  4.)  If you develop worsening shortness of breath-please seek immediate medical attention  5.)  Incidental finding of right adrenal gland adenoma on CT scan of  the chest-please ask your primary care practitioner to initiate further work-up.   Increase activity slowly   Complete by: As directed      Allergies as of 12/19/2020      Reactions   Penicillins       Medication List    STOP taking these medications   aspirin 81 MG tablet   guaiFENesin 100 MG/5ML liquid Commonly known as: ROBITUSSIN   meloxicam 15 MG tablet Commonly known as: MOBIC     TAKE these medications   albuterol 108 (90 Base) MCG/ACT inhaler Commonly known as: VENTOLIN HFA Inhale 2 puffs into the lungs every 6 (six) hours as needed for wheezing or shortness of breath.   Apixaban Starter Pack (10mg  and 5mg ) Commonly known as: ELIQUIS STARTER PACK Take as directed on package: start with two-5mg  tablets twice daily for 7 days. On day 8, switch to one-5mg  tablet twice daily.   atorvastatin 20 MG tablet Commonly known as: LIPITOR Take 1 tablet (20 mg total) by mouth  daily.   benzonatate 100 MG capsule Commonly known as: Tessalon Perles Take 1 capsule (100 mg total) by mouth every 6 (six) hours as needed for cough.   BLACK ELDERBERRY PO Take 1 capsule by mouth daily.   cetirizine 10 MG tablet Commonly known as: ZYRTEC Take 1 tablet (10 mg total) by mouth as needed. What changed: when to take this   enalapril 10 MG tablet Commonly known as: VASOTEC Take 10 mg by mouth daily.   famotidine 20 MG tablet Commonly known as: PEPCID Take 20 mg by mouth daily.   finasteride 5 MG tablet Commonly known as: PROSCAR Take 1 tablet (5 mg total) by mouth daily.   fluticasone 50 MCG/ACT nasal spray Commonly known as: FLONASE Place 2 sprays into both nostrils daily.   hydrochlorothiazide 25 MG tablet Commonly known as: HYDRODIURIL Take 25 mg by mouth daily.   levothyroxine 150 MCG tablet Commonly known as: Synthroid Take 1 tablet (150 mcg total) by mouth daily before breakfast.   multivitamin tablet Take 1 tablet by mouth daily.   Myrbetriq 25 MG Tb24 tablet Generic drug: mirabegron ER Take 25 mg by mouth daily.   predniSONE 10 MG tablet Commonly known as: DELTASONE Take 40 mg daily for 1 day, 30 mg daily for 1 day, 20 mg daily for 1 days,10 mg daily for 1 day, then stop   tamsulosin 0.4 MG Caps capsule Commonly known as: FLOMAX Take 0.4 mg by mouth daily.   vitamin A 3 MG (10000 UNITS) capsule Take 10,000 Units by mouth daily.   vitamin C 500 MG tablet Commonly known as: ASCORBIC ACID Take 500 mg by mouth daily.   Vitamin D3 125 MCG (5000 UT) Caps Take 1 capsule by mouth daily in the afternoon.            Durable Medical Equipment  (From admission, onward)         Start     Ordered   12/19/20 0918  For home use only DME oxygen  Once       Question Answer Comment  Length of Need 6 Months   Mode or (Route) Nasal cannula   Liters per Minute 3   Frequency Continuous (stationary and portable oxygen unit needed)   Oxygen  conserving device Yes   Oxygen delivery system Gas      12/19/20 0918          Follow-up Information    Inda Coke, [place]. Schedule an appointment  as soon as possible for a visit in 1 week(s).   Specialty: Physician Assistant Contact information: [place] [address] [phone]              Allergies  Allergen Reactions  . Penicillins      Other Procedures/Studies: CT ANGIO CHEST PE W OR WO CONTRAST  Result Date: 12/15/2020 CLINICAL DATA:  Shortness of breath and hypoxia. COVID-19 positive. Myalgias. EXAM: CT ANGIOGRAPHY CHEST WITH CONTRAST TECHNIQUE: Multidetector CT imaging of the chest was performed using the standard protocol during bolus administration of intravenous contrast. Multiplanar CT image reconstructions and MIPs were obtained to evaluate the vascular anatomy. CONTRAST:  65mL OMNIPAQUE IOHEXOL 350 MG/ML SOLN COMPARISON:  Portable chest obtained earlier today. FINDINGS: Cardiovascular: Mildly enlarged heart. Normally opacified pulmonary arteries with no pulmonary arterial filling defects seen. Mediastinum/Nodes: Mildly enlarged subcarinal nodes. The largest has a short axis diameter of 11 mm on image number 48 series 5. There is also a mildly enlarged right paratracheal node with a short axis diameter 10 mm on image number 15 series 5. Unremarkable thyroid gland.  No visible esophageal abnormality. Lungs/Pleura: Large number of focal patchy opacities in both lungs, involving all lobes. Small left pleural effusion and minimal right pleural effusion. Upper Abdomen: Multiple surgical clips in the region of the gastroesophageal junction. 2.6 cm oval mass in the right adrenal gland with a mean density of 4 Hounsfield units on image number 88 series 5. Accessory splenule inferior to the spleen. Musculoskeletal: Thoracic and lower cervical spine degenerative changes. Review of the MIP images confirms the above findings. IMPRESSION: 1. No pulmonary  emboli. 2. Large number of focal patchy opacities in both lungs, involving all lobes, compatible with COVID-19 pneumonia. 3. Small left pleural effusion and minimal right pleural effusion. 4. Mild mediastinal adenopathy, most likely reactive. 5. 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 Electronically Signed   By: Claudie Revering M.D.   On: 12/15/2020 14:27   DG Chest Portable 1 View  Result Date: 12/15/2020 CLINICAL DATA:  Shortness of breath EXAM: PORTABLE CHEST 1 VIEW COMPARISON:  Chest x-rays dated 12/07/2020 and 04/10/2008 FINDINGS: Worsening bilateral airspace opacities, LEFT slightly greater than RIGHT. No pleural effusion or pneumothorax is seen. Heart size and mediastinal contours are stable. IMPRESSION: Worsening bilateral multifocal pneumonia. Electronically Signed   By: Franki Cabot M.D.   On: 12/15/2020 10:36   VAS [place] LOWER EXTREMITY VENOUS (DVT)  Result Date: 12/16/2020  Lower Venous DVT Study Indications: Elevated Ddimer.  Risk Factors: COVID 19 positive. Comparison Study: No prior studies. Performing Technologist: Oliver Hum RVT  Examination Guidelines: A complete evaluation includes B-mode imaging, spectral Doppler, color Doppler, and power Doppler as needed of all accessible portions of each vessel. Bilateral testing is considered an integral part of a complete examination. Limited examinations for reoccurring indications may be performed as noted. The reflux portion of the exam is performed with the patient in reverse Trendelenburg.  +---------+---------------+---------+-----------+----------+--------------+ RIGHT    CompressibilityPhasicitySpontaneityPropertiesThrombus Aging +---------+---------------+---------+-----------+----------+--------------+ CFV      Full           Yes      Yes                                 +---------+---------------+---------+-----------+----------+--------------+ SFJ      Full                                                         +---------+---------------+---------+-----------+----------+--------------+  FV Prox  Full                                                        +---------+---------------+---------+-----------+----------+--------------+ FV Mid   Full                                                        +---------+---------------+---------+-----------+----------+--------------+ FV DistalFull                                                        +---------+---------------+---------+-----------+----------+--------------+ PFV      Full                                                        +---------+---------------+---------+-----------+----------+--------------+ POP      Full           Yes      Yes                                 +---------+---------------+---------+-----------+----------+--------------+ PTV      Full                                                        +---------+---------------+---------+-----------+----------+--------------+ PERO     Full                                                        +---------+---------------+---------+-----------+----------+--------------+   +---------+---------------+---------+-----------+----------+--------------+ LEFT     CompressibilityPhasicitySpontaneityPropertiesThrombus Aging +---------+---------------+---------+-----------+----------+--------------+ CFV      Full           Yes      Yes                                 +---------+---------------+---------+-----------+----------+--------------+ SFJ      Full                                                        +---------+---------------+---------+-----------+----------+--------------+ FV Prox  Full                                                        +---------+---------------+---------+-----------+----------+--------------+  FV Mid   Full                                                         +---------+---------------+---------+-----------+----------+--------------+ FV DistalFull                                                        +---------+---------------+---------+-----------+----------+--------------+ PFV      Full                                                        +---------+---------------+---------+-----------+----------+--------------+ POP      Full           Yes      Yes                                 +---------+---------------+---------+-----------+----------+--------------+ PTV      Full                                                        +---------+---------------+---------+-----------+----------+--------------+ PERO     Full                                                        +---------+---------------+---------+-----------+----------+--------------+ Gastroc  Partial                                      Acute          +---------+---------------+---------+-----------+----------+--------------+     Summary: RIGHT: - There is no evidence of deep vein thrombosis in the lower extremity.  - No cystic structure found in the popliteal fossa.  LEFT: - Findings consistent with acute deep vein thrombosis involving the left gastrocnemius veins. - No cystic structure found in the popliteal fossa.  *See table(s) above for measurements and observations. Electronically signed by Monica Martinez MD on 12/16/2020 at 3:57:02 PM.    Final      TODAY-DAY OF DISCHARGE:  Subjective:   Mitchell Humphrey today has no headache,no chest abdominal pain,no new weakness tingling or numbness, feels much better wants to go home today.   Objective:   Blood pressure (!) 133/41, pulse 84, temperature 97.6 F (36.4 C), temperature source Oral, resp. rate 20, height 5\' 11"  (1.803 m), weight 111.4 kg, SpO2 92 %.  Intake/Output Summary (Last 24 hours) at 12/19/2020 0929 Last data filed at 12/19/2020 0519 Gross per 24 hour  Intake 1103 ml  Output 750 ml  Net 353 ml    Filed Weights   12/16/20 0428 12/19/20  0554  Weight: 109.5 kg 111.4 kg    Exam: Awake Alert, Oriented *3, No new F.N deficits, Normal affect [place].AT,PERRAL Supple Neck,No JVD, No cervical lymphadenopathy appriciated.  Symmetrical Chest wall movement, Good air movement bilaterally, CTAB RRR,No Gallops,Rubs or new Murmurs, No Parasternal Heave +ve B.Sounds, Abd Soft, Non tender, No organomegaly appriciated, No rebound -guarding or rigidity. No Cyanosis, Clubbing or edema, No new Rash or bruise   PERTINENT RADIOLOGIC STUDIES: CT ANGIO CHEST PE W OR WO CONTRAST  Result Date: 12/15/2020 CLINICAL DATA:  Shortness of breath and hypoxia. COVID-19 positive. Myalgias. EXAM: CT ANGIOGRAPHY CHEST WITH CONTRAST TECHNIQUE: Multidetector CT imaging of the chest was performed using the standard protocol during bolus administration of intravenous contrast. Multiplanar CT image reconstructions and MIPs were obtained to evaluate the vascular anatomy. CONTRAST:  5mL OMNIPAQUE IOHEXOL 350 MG/ML SOLN COMPARISON:  Portable chest obtained earlier today. FINDINGS: Cardiovascular: Mildly enlarged heart. Normally opacified pulmonary arteries with no pulmonary arterial filling defects seen. Mediastinum/Nodes: Mildly enlarged subcarinal nodes. The largest has a short axis diameter of 11 mm on image number 48 series 5. There is also a mildly enlarged right paratracheal node with a short axis diameter 10 mm on image number 15 series 5. Unremarkable thyroid gland.  No visible esophageal abnormality. Lungs/Pleura: Large number of focal patchy opacities in both lungs, involving all lobes. Small left pleural effusion and minimal right pleural effusion. Upper Abdomen: Multiple surgical clips in the region of the gastroesophageal junction. 2.6 cm oval mass in the right adrenal gland with a mean density of 4 Hounsfield units on image number 88 series 5. Accessory splenule inferior to the spleen. Musculoskeletal: Thoracic and lower  cervical spine degenerative changes. Review of the MIP images confirms the above findings. IMPRESSION: 1. No pulmonary emboli. 2. Large number of focal patchy opacities in both lungs, involving all lobes, compatible with COVID-19 pneumonia. 3. Small left pleural effusion and minimal right pleural effusion. 4. Mild mediastinal adenopathy, most likely reactive. 5. 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 Electronically Signed   By: Claudie Revering M.D.   On: 12/15/2020 14:27   DG Chest Portable 1 View  Result Date: 12/15/2020 CLINICAL DATA:  Shortness of breath EXAM: PORTABLE CHEST 1 VIEW COMPARISON:  Chest x-rays dated 12/07/2020 and 04/10/2008 FINDINGS: Worsening bilateral airspace opacities, LEFT slightly greater than RIGHT. No pleural effusion or pneumothorax is seen. Heart size and mediastinal contours are stable. IMPRESSION: Worsening bilateral multifocal pneumonia. Electronically Signed   By: Franki Cabot M.D.   On: 12/15/2020 10:36   VAS [place] LOWER EXTREMITY VENOUS (DVT)  Result Date: 12/16/2020  Lower Venous DVT Study Indications: Elevated Ddimer.  Risk Factors: COVID 19 positive. Comparison Study: No prior studies. Performing Technologist: Oliver Hum RVT  Examination Guidelines: A complete evaluation includes B-mode imaging, spectral Doppler, color Doppler, and power Doppler as needed of all accessible portions of each vessel. Bilateral testing is considered an integral part of a complete examination. Limited examinations for reoccurring indications may be performed as noted. The reflux portion of the exam is performed with the patient in reverse Trendelenburg.  +---------+---------------+---------+-----------+----------+--------------+ RIGHT    CompressibilityPhasicitySpontaneityPropertiesThrombus Aging +---------+---------------+---------+-----------+----------+--------------+ CFV      Full           Yes      Yes                                  +---------+---------------+---------+-----------+----------+--------------+ SFJ  Full                                                        +---------+---------------+---------+-----------+----------+--------------+ FV Prox  Full                                                        +---------+---------------+---------+-----------+----------+--------------+ FV Mid   Full                                                        +---------+---------------+---------+-----------+----------+--------------+ FV DistalFull                                                        +---------+---------------+---------+-----------+----------+--------------+ PFV      Full                                                        +---------+---------------+---------+-----------+----------+--------------+ POP      Full           Yes      Yes                                 +---------+---------------+---------+-----------+----------+--------------+ PTV      Full                                                        +---------+---------------+---------+-----------+----------+--------------+ PERO     Full                                                        +---------+---------------+---------+-----------+----------+--------------+   +---------+---------------+---------+-----------+----------+--------------+ LEFT     CompressibilityPhasicitySpontaneityPropertiesThrombus Aging +---------+---------------+---------+-----------+----------+--------------+ CFV      Full           Yes      Yes                                 +---------+---------------+---------+-----------+----------+--------------+ SFJ      Full                                                        +---------+---------------+---------+-----------+----------+--------------+  FV Prox  Full                                                         +---------+---------------+---------+-----------+----------+--------------+ FV Mid   Full                                                        +---------+---------------+---------+-----------+----------+--------------+ FV DistalFull                                                        +---------+---------------+---------+-----------+----------+--------------+ PFV      Full                                                        +---------+---------------+---------+-----------+----------+--------------+ POP      Full           Yes      Yes                                 +---------+---------------+---------+-----------+----------+--------------+ PTV      Full                                                        +---------+---------------+---------+-----------+----------+--------------+ PERO     Full                                                        +---------+---------------+---------+-----------+----------+--------------+ Gastroc  Partial                                      Acute          +---------+---------------+---------+-----------+----------+--------------+     Summary: RIGHT: - There is no evidence of deep vein thrombosis in the lower extremity.  - No cystic structure found in the popliteal fossa.  LEFT: - Findings consistent with acute deep vein thrombosis involving the left gastrocnemius veins. - No cystic structure found in the popliteal fossa.  *See table(s) above for measurements and observations. Electronically signed by Monica Martinez MD on 12/16/2020 at 3:57:02 PM.    Final      PERTINENT LAB RESULTS: CBC: Recent Labs    12/18/20 0230 12/19/20 0221  WBC 14.6* 14.8*  HGB 11.8* 11.7*  HCT 35.5* 36.7*  PLT 458* 424*   CMET CMP     Component Value Date/Time   NA  134 (L) 12/19/2020 0221   K 4.6 12/19/2020 0221   CL 99 12/19/2020 0221   CO2 26 12/19/2020 0221   GLUCOSE 230 (H) 12/19/2020 0221   BUN 34 (H) 12/19/2020 0221    CREATININE 0.95 12/19/2020 0221   CREATININE 0.87 10/23/2020 1409   CALCIUM 8.8 (L) 12/19/2020 0221   PROT 5.2 (L) 12/19/2020 0221   ALBUMIN 2.4 (L) 12/19/2020 0221   AST 43 (H) 12/19/2020 0221   ALT 109 (H) 12/19/2020 0221   ALKPHOS 47 12/19/2020 0221   BILITOT 0.5 12/19/2020 0221   GFRNONAA >60 12/19/2020 0221    GFR Estimated Creatinine Clearance: 81.3 mL/min (by C-G formula based on SCr of 0.95 mg/dL). No results for input(s): LIPASE, AMYLASE in the last 72 hours. No results for input(s): CKTOTAL, CKMB, CKMBINDEX, TROPONINI in the last 72 hours. Invalid input(s): POCBNP Recent Labs    12/18/20 0230 12/19/20 0221  DDIMER 3.43* 2.61*   No results for input(s): HGBA1C in the last 72 hours. No results for input(s): CHOL, HDL, LDLCALC, TRIG, CHOLHDL, LDLDIRECT in the last 72 hours. No results for input(s): TSH, T4TOTAL, T3FREE, THYROIDAB in the last 72 hours.  Invalid input(s): FREET3 Recent Labs    12/17/20 0219 12/18/20 0230  FERRITIN 587* 544*   Coags: No results for input(s): INR in the last 72 hours.  Invalid input(s): PT Microbiology: Recent Results (from the past 240 hour(s))  SARS Coronavirus 2 by RT PCR (hospital order, performed in [facility] hospital lab) Nasopharyngeal Nasopharyngeal Swab     Status: Abnormal   Collection Time: 12/15/20  2:19 PM   Specimen: Nasopharyngeal Swab  Result Value Ref Range Status   SARS Coronavirus 2 POSITIVE (A) NEGATIVE Final    Comment: RESULT CALLED TO, READ BACK BY AND VERIFIED WITH: Trudi Ida RN 12/15/20 AT 1525 SK  (NOTE) SARS-CoV-2 target nucleic acids are DETECTED  SARS-CoV-2 RNA is generally detectable in upper respiratory specimens  during the acute phase of infection.  Positive results are indicative  of the presence of the identified virus, but do not rule out bacterial infection or co-infection with other pathogens not detected by the test.  Clinical correlation with patient history and  other diagnostic  information is necessary to determine patient infection status.  The expected result is negative.  Fact Sheet for Patients:   StrictlyIdeas.no   Fact Sheet for Healthcare Providers:   BankingDealers.co.za    This test is not yet approved or cleared by the [place] FDA and  has been authorized for detection and/or diagnosis of SARS-CoV-2 by FDA under an Emergency Use Authorization (EUA).  This EUA will remain in effect (meaning this tes t can be used) for the duration of  the COVID-19 declaration under Section 564(b)(1) of the Act, 21 U.S.C. section 360-bbb-3(b)(1), unless the authorization is terminated or revoked sooner.  Performed at [facility], [place] [address] [place], [place] [address]     FURTHER DISCHARGE INSTRUCTIONS:  Get Medicines reviewed and adjusted: Please take all your medications with you for your next visit with your Primary MD  Laboratory/radiological data: Please request your Primary MD to go over all hospital tests and procedure/radiological results at the follow up, please ask your Primary MD to get all Hospital records sent to his/her office.  In some cases, they will be blood work, cultures and biopsy results pending at the time of your discharge. Please request that your primary care M.D. goes through all the records of your hospital data  and follows up on these results.  Also Note the following: If you experience worsening of your admission symptoms, develop shortness of breath, life threatening emergency, suicidal or homicidal thoughts you must seek medical attention immediately by calling 911 or calling your MD immediately  if symptoms less severe.  You must read complete instructions/literature along with all the possible adverse reactions/side effects for all the Medicines you take and that have been prescribed to you. Take any new Medicines after you have completely understood and accpet all the  possible adverse reactions/side effects.   Do not drive when taking Pain medications or sleeping medications (Benzodaizepines)  Do not take more than prescribed Pain, Sleep and Anxiety Medications. It is not advisable to combine anxiety,sleep and pain medications without talking with your primary care practitioner  Special Instructions: If you have smoked or chewed Tobacco  in the last 2 yrs please stop smoking, stop any regular Alcohol  and or any Recreational drug use.  Wear Seat belts while driving.  Please note: You were cared for by a hospitalist during your hospital stay. Once you are discharged, your primary care physician will handle any further medical issues. Please note that NO REFILLS for any discharge medications will be authorized once you are discharged, as it is imperative that you return to your primary care physician (or establish a relationship with a primary care physician if you do not have one) for your post hospital discharge needs so that they can reassess your need for medications and monitor your lab values.  Total Time spent coordinating discharge including counseling, education and face to face time equals 35 minutes.  SignedOren Binet 12/19/2020 9:29 AM

## 2020-12-20 ENCOUNTER — Other Ambulatory Visit: Payer: Self-pay | Admitting: Physician Assistant

## 2020-12-20 ENCOUNTER — Telehealth: Payer: Self-pay

## 2020-12-20 NOTE — Telephone Encounter (Signed)
Transition Care Management Unsuccessful Follow-up Telephone Call  Date of discharge and from where:  12/19/20 from [facility] [place]  Attempts:  1st Attempt  Reason for unsuccessful TCM follow-up call:  Voice mail full

## 2020-12-20 NOTE — Telephone Encounter (Signed)
Please advise about refill, f/u canceled due to being covid+

## 2020-12-23 ENCOUNTER — Ambulatory Visit (INDEPENDENT_AMBULATORY_CARE_PROVIDER_SITE_OTHER): Payer: Medicare Other | Admitting: Physician Assistant

## 2020-12-23 ENCOUNTER — Telehealth: Payer: Self-pay

## 2020-12-23 ENCOUNTER — Other Ambulatory Visit: Payer: Self-pay

## 2020-12-23 ENCOUNTER — Encounter: Payer: Self-pay | Admitting: Physician Assistant

## 2020-12-23 VITALS — BP 100/70 | HR 86 | Temp 97.9°F | Ht 71.0 in | Wt 243.2 lb

## 2020-12-23 DIAGNOSIS — R29898 Other symptoms and signs involving the musculoskeletal system: Secondary | ICD-10-CM | POA: Diagnosis not present

## 2020-12-23 DIAGNOSIS — D3501 Benign neoplasm of right adrenal gland: Secondary | ICD-10-CM

## 2020-12-23 DIAGNOSIS — U071 COVID-19: Secondary | ICD-10-CM

## 2020-12-23 DIAGNOSIS — J9601 Acute respiratory failure with hypoxia: Secondary | ICD-10-CM

## 2020-12-23 DIAGNOSIS — I82492 Acute embolism and thrombosis of other specified deep vein of left lower extremity: Secondary | ICD-10-CM

## 2020-12-23 DIAGNOSIS — R590 Localized enlarged lymph nodes: Secondary | ICD-10-CM | POA: Diagnosis not present

## 2020-12-23 LAB — CBC WITH DIFFERENTIAL/PLATELET
Basophils Absolute: 0 10*3/uL (ref 0.0–0.1)
Basophils Relative: 0.2 % (ref 0.0–3.0)
Eosinophils Absolute: 0.2 10*3/uL (ref 0.0–0.7)
Eosinophils Relative: 1.5 % (ref 0.0–5.0)
HCT: 41.8 % (ref 39.0–52.0)
Hemoglobin: 13.5 g/dL (ref 13.0–17.0)
Lymphocytes Relative: 9.3 % — ABNORMAL LOW (ref 12.0–46.0)
Lymphs Abs: 1.4 10*3/uL (ref 0.7–4.0)
MCHC: 32.4 g/dL (ref 30.0–36.0)
MCV: 81.6 fl (ref 78.0–100.0)
Monocytes Absolute: 1.5 10*3/uL — ABNORMAL HIGH (ref 0.1–1.0)
Monocytes Relative: 10 % (ref 3.0–12.0)
Neutro Abs: 11.9 10*3/uL — ABNORMAL HIGH (ref 1.4–7.7)
Neutrophils Relative %: 79 % — ABNORMAL HIGH (ref 43.0–77.0)
Platelets: 417 10*3/uL — ABNORMAL HIGH (ref 150.0–400.0)
RBC: 5.12 Mil/uL (ref 4.22–5.81)
RDW: 14.8 % (ref 11.5–15.5)
WBC: 15.1 10*3/uL — ABNORMAL HIGH (ref 4.0–10.5)

## 2020-12-23 LAB — COMPREHENSIVE METABOLIC PANEL
ALT: 81 U/L — ABNORMAL HIGH (ref 0–53)
AST: 26 U/L (ref 0–37)
Albumin: 3.4 g/dL — ABNORMAL LOW (ref 3.5–5.2)
Alkaline Phosphatase: 51 U/L (ref 39–117)
BUN: 24 mg/dL — ABNORMAL HIGH (ref 6–23)
CO2: 27 mEq/L (ref 19–32)
Calcium: 9.2 mg/dL (ref 8.4–10.5)
Chloride: 100 mEq/L (ref 96–112)
Creatinine, Ser: 0.78 mg/dL (ref 0.40–1.50)
GFR: 85.53 mL/min (ref >60.00)
Glucose, Bld: 83 mg/dL (ref 70–99)
Potassium: 4.3 mEq/L (ref 3.5–5.1)
Sodium: 134 mEq/L — ABNORMAL LOW (ref 135–145)
Total Bilirubin: 0.5 mg/dL (ref 0.2–1.2)
Total Protein: 6.1 g/dL (ref 6.0–8.3)

## 2020-12-23 MED ORDER — BENZONATATE 100 MG PO CAPS: 100.0000 mg | ORAL_CAPSULE | Freq: Four times a day (QID) | ORAL | 1 refills | Status: DC | PRN

## 2020-12-23 NOTE — Progress Notes (Signed)
Mitchell Humphrey is a 80 y.o. male is here for hospital f/u.  I acted as a Education administrator for [employer], PA-C Anselmo Pickler, LPN   History of Present Illness:   Chief Complaint  Patient presents with  . Hospitalization Follow-up    HPI   Hospital f/u Pt was admitted to the hospital on 12/15/2020 through 12/19/2020 for ARF 2/2 COVID-19. Pt has been on oxygen since home from hospital, currently on 3L/min via nasal cannula. He was treated with steroids/baricitinib and Remdesivir.   He was also found to have DVT in his L leg. He is currently on eliquis for this. Tolerating well, denies any concerns. No excessive bleeding/bruising.  On his CT angio performed on 12/15/20, he was found to have 4. Mild mediastinal adenopathy, most likely reactive. 5. 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  Lowest his oxygen has been home after activity is around 94%.  Tessalon perles are helpful for his symptoms. Feels weak from his hospital stay.  Today is day 21 after his positive COVID test.  Has albuterol inhaler that he has used rarely since d/c.  Denies: dizziness, lightheadedness, severe SOB, n/v   Health Maintenance Due  Topic Date Due  . Hepatitis C Screening  Never done  . COVID-19 Vaccine (3 - Booster for Moderna series) 09/23/2020    Past Medical History:  Diagnosis Date  . Allergy   . Arthritis   . BPH (benign prostatic hyperplasia)   . Cataract   . Dyslipidemia   . Erectile dysfunction   . GERD (gastroesophageal reflux disease)   . Hx of adenomatous colonic polyps   . Hyperlipidemia   . Hypertension   . Hypogonadism male   . Hypothyroid   . Obesity      Social History   Tobacco Use  . Smoking status: Never Smoker  . Smokeless tobacco: Never Used  . Tobacco comment: He chews on a cigar  Vaping Use  . Vaping Use: Never used  Substance Use Topics  . Alcohol use: No  . Drug use: No    Past Surgical History:  Procedure Laterality Date  . COLONOSCOPY  08/2008   Dr. Watt Climes   . HERNIA REPAIR  1970   [facility]    Family History  Problem Relation Age of Onset  . Breast cancer Mother   . Lung cancer Father   . Brain cancer Father   . Breast cancer Sister     PMHx, SurgHx, SocialHx, FamHx, Medications, and Allergies were reviewed in the Visit Navigator and updated as appropriate.   Patient Active Problem List   Diagnosis Date Noted  . Acute hypoxemic respiratory failure due to COVID-19 ([place]) 12/15/2020  . COVID-19 virus infection 12/07/2020  . Insulin resistance 10/23/2020  . Severe obesity (BMI 35.0-35.9 with comorbidity) ([place]) 08/05/2020  . 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 GERD (gastroesophageal reflux disease) 12/20/2018  . Chronic low back pain 05/18/2018  . Hypercholesterolemia 08/09/2017  . BPH (benign prostatic hyperplasia) 08/09/2017  . Osteoarthritis of both hands 08/09/2017  . 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 History of melanoma 02/04/2015  . Elevated prostate specific antigen (PSA) 03/15/2013  . Increased frequency of urination 03/15/2013  . Urge incontinence 03/15/2013  . Benign essential hypertension 05/25/2012  . Hypothyroidism (acquired) 05/25/2012  . Dyslipidemia 05/25/2012  . Hypogonadism male 05/25/2012    Social History   Tobacco Use  . Smoking status: Never Smoker  . Smokeless tobacco: Never Used  . Tobacco comment: He chews on a cigar  Vaping Use  . Vaping Use: Never used  Substance Use Topics  . Alcohol use: No  . Drug use: No    Current Medications and Allergies:    Current Outpatient Medications:  .  albuterol (VENTOLIN HFA) 108 (90 Base) MCG/ACT inhaler, Inhale 2 puffs into the lungs every 6 (six) hours as needed for wheezing or shortness of breath., Disp: 6.7 g, Rfl: 0 .  APIXABAN (ELIQUIS) VTE STARTER PACK (10MG  AND 5MG ), Take as directed on package: start with two-5mg  tablets twice daily for 7 days. On day 8, switch to one-5mg  tablet twice daily., Disp: 1 each, Rfl: 0 .  atorvastatin  (LIPITOR) 20 MG tablet, Take 1 tablet (20 mg total) by mouth daily., Disp: 90 tablet, Rfl: 1 .  BLACK ELDERBERRY PO, Take 1 capsule by mouth daily., Disp: , Rfl:  .  cetirizine (ZYRTEC) 10 MG tablet, Take 1 tablet (10 mg total) by mouth as needed. (Patient taking differently: Take 10 mg by mouth daily.), Disp: 90 tablet, Rfl: 3 .  Cholecalciferol (VITAMIN D3) 125 MCG (5000 UT) CAPS, Take 1 capsule by mouth daily in the afternoon., Disp: , Rfl:  .  enalapril (VASOTEC) 10 MG tablet, Take 10 mg by mouth daily., Disp: , Rfl:  .  EUTHYROX 150 MCG tablet, TAKE 1 TABLET BY MOUTH ONCE DAILY BEFORE BREAKFAST, Disp: 30 tablet, Rfl: 0 .  famotidine (PEPCID) 20 MG tablet, Take 20 mg by mouth daily., Disp: , Rfl:  .  finasteride (PROSCAR) 5 MG tablet, Take 1 tablet (5 mg total) by mouth daily., Disp: 90 tablet, Rfl: 1 .  fluticasone (FLONASE) 50 MCG/ACT nasal spray, Place 2 sprays into both nostrils daily., Disp: 16 g, Rfl: 2 .  hydrochlorothiazide (HYDRODIURIL) 25 MG tablet, Take 25 mg by mouth daily., Disp: , Rfl:  .  Multiple Vitamin (MULTIVITAMIN) tablet, Take 1 tablet by mouth daily., Disp: , Rfl:  .  MYRBETRIQ 25 MG TB24 tablet, Take 25 mg by mouth daily., Disp: , Rfl:  .  predniSONE (DELTASONE) 10 MG tablet, Take 40 mg daily for 1 day, 30 mg daily for 1 day, 20 mg daily for 1 days,10 mg daily for 1 day, then stop, Disp: 10 tablet, Rfl: 0 .  tamsulosin (FLOMAX) 0.4 MG CAPS capsule, Take 0.4 mg by mouth daily., Disp: , Rfl:  .  vitamin A 3 MG (10000 UNITS) capsule, Take 10,000 Units by mouth daily., Disp: , Rfl:  .  vitamin C (ASCORBIC ACID) 500 MG tablet, Take 500 mg by mouth daily., Disp: , Rfl:  .  benzonatate (TESSALON PERLES) 100 MG capsule, Take 1 capsule (100 mg total) by mouth every 6 (six) hours as needed for cough., Disp: 60 capsule, Rfl: 1   Allergies  Allergen Reactions  . Penicillins     Review of Systems    ROS Negative unless otherwise specified per HPI.   Vitals:   Vitals:    12/23/20 1306  BP: 100/70  Pulse: 86  Temp: 97.9 F (36.6 C)  TempSrc: Temporal  SpO2: 96%  Weight: 243 lb 4 oz (110.3 kg)  Height: 5\' 11"  (1.803 m)     Body mass index is 33.93 kg/m.   Physical Exam:    Physical Exam Vitals and nursing note reviewed.  Constitutional:      General: He is not in acute distress.    Appearance: He is well-developed. He is not ill-appearing, toxic-appearing or sickly-appearing.  Cardiovascular:     Rate and Rhythm: Normal rate and regular  rhythm.     Pulses: Normal pulses.     Heart sounds: Normal heart sounds, S1 normal and S2 normal.     Comments: No LE edema Pulmonary:     Effort: Pulmonary effort is normal.     Breath sounds: Normal breath sounds.     Comments: Currently on 3 L oxygen Skin:    General: Skin is warm, dry and intact.  Neurological:     Mental Status: He is alert.     GCS: GCS eye subscore is 4. GCS verbal subscore is 5. GCS motor subscore is 6.  Psychiatric:        Mood and Affect: Mood and affect normal.        Speech: Speech normal.        Behavior: Behavior normal. Behavior is cooperative.      Assessment and Plan:    Basim was seen today for hospitalization follow-up.  Diagnoses and all orders for this visit:  COVID-19 virus infection Today is day 21 of his COVID + test. Can technically end isolation today. Continue strict mask usage. Refill tessalon perles. -     CBC with Differential/Platelet -     Comprehensive metabolic panel  Acute hypoxemic respiratory failure due to COVID-19 (HCC) Remains on 3 L oxygen. He has done a few trials at home with decreasing levels, but consistently. We did discuss this. Keep an eye on pulse ox level and if numbers consistently <88% or new sx, to let us know. Consider pulmonary referral if has worsening symptoms.  Muscular deconditioning As expected due to hospitalization. PT for deconditioning and to get him back to work. -     Ambulatory referral to Physical  Therapy  Acute deep vein thrombosis (DVT) of other specified vein of left lower extremity ([place]) Discussed need to continue Eliquis for minimum of 3 months. Verbalized understanding. Discussed bleeding/bruising risks with these medications.  Mediastinal adenopathy Discussed with patient and wife.  Will defer discussion to updating imaging until another 4-6 weeks from now as he continues to improve from his COVID-19 illlness.  Adenoma of right adrenal gland Discussed with patient and wife.  Will defer discussion to updating imaging until another 4-6 weeks from now as he continues to improve from his COVID-19 illlness.  Other orders -     benzonatate (TESSALON PERLES) 100 MG capsule; Take 1 capsule (100 mg total) by mouth every 6 (six) hours as needed for cough.   CMA or LPN served as scribe during this visit. History, Physical, and Plan performed by medical provider. The above documentation has been reviewed and is accurate and complete.  Time spent with patient today was 25 minutes which consisted of chart review, discussing diagnosis, work up, treatment answering questions and documentation.  Inda Coke, PA-C [place], [place] 12/23/2020  Follow-up: No follow-ups on file.

## 2020-12-23 NOTE — Telephone Encounter (Signed)
Spoke to [place], PT was for here she will contact pt and schedule.

## 2020-12-23 NOTE — Telephone Encounter (Signed)
Pt. Was referred to physical therapy. Pt was under the impression it was here, but the referral has a different location

## 2020-12-23 NOTE — Patient Instructions (Signed)
It was great to see you!  Update blood work today.  Schedule a visit with Ander Purpura or Antony Haste on your way out to start PT and get you stronger.  Keep an eye on your oxygen level and let us know if you have any concerns.  Let's follow-up in 4-6 weeks, sooner if you have concerns.  Take care,  Inda Coke PA-C

## 2020-12-23 NOTE — Addendum Note (Signed)
Addended by: Erlene Quan on: 12/23/2020 02:00 PM   Modules accepted: Orders

## 2020-12-24 ENCOUNTER — Ambulatory Visit: Payer: Medicare Other | Admitting: Physician Assistant

## 2020-12-26 ENCOUNTER — Ambulatory Visit (INDEPENDENT_AMBULATORY_CARE_PROVIDER_SITE_OTHER): Payer: Medicare Other | Admitting: Physician Assistant

## 2020-12-26 ENCOUNTER — Encounter: Payer: Self-pay | Admitting: Physician Assistant

## 2020-12-26 ENCOUNTER — Other Ambulatory Visit: Payer: Self-pay

## 2020-12-26 ENCOUNTER — Ambulatory Visit (HOSPITAL_COMMUNITY): Payer: Medicare Other | Attending: Cardiology

## 2020-12-26 ENCOUNTER — Other Ambulatory Visit: Payer: Self-pay | Admitting: *Deleted

## 2020-12-26 ENCOUNTER — Telehealth: Payer: Self-pay

## 2020-12-26 VITALS — BP 100/60 | HR 94 | Temp 98.5°F | Ht 71.0 in | Wt 238.4 lb

## 2020-12-26 DIAGNOSIS — R931 Abnormal findings on diagnostic imaging of heart and coronary circulation: Secondary | ICD-10-CM

## 2020-12-26 DIAGNOSIS — R109 Unspecified abdominal pain: Secondary | ICD-10-CM | POA: Diagnosis not present

## 2020-12-26 DIAGNOSIS — R634 Abnormal weight loss: Secondary | ICD-10-CM

## 2020-12-26 DIAGNOSIS — U071 COVID-19: Secondary | ICD-10-CM

## 2020-12-26 DIAGNOSIS — R1012 Left upper quadrant pain: Secondary | ICD-10-CM | POA: Diagnosis not present

## 2020-12-26 DIAGNOSIS — R011 Cardiac murmur, unspecified: Secondary | ICD-10-CM | POA: Diagnosis not present

## 2020-12-26 LAB — POCT URINALYSIS DIPSTICK
Bilirubin, UA: NEGATIVE
Blood, UA: NEGATIVE
Glucose, UA: NEGATIVE
Ketones, UA: NEGATIVE
Leukocytes, UA: NEGATIVE
Nitrite, UA: NEGATIVE
Protein, UA: NEGATIVE
Spec Grav, UA: 1.025 (ref 1.010–1.025)
Urobilinogen, UA: 0.2 E.U./dL
pH, UA: 6 (ref 5.0–8.0)

## 2020-12-26 LAB — ECHOCARDIOGRAM COMPLETE
AR max vel: 1.23 cm2
AV Area VTI: 1.38 cm2
AV Area mean vel: 1.22 cm2
AV Mean grad: 22 mmHg
AV Peak grad: 32.3 mmHg
Ao pk vel: 2.84 m/s
Area-P 1/2: 2.83 cm2
S' Lateral: 2.7 cm

## 2020-12-26 NOTE — Progress Notes (Signed)
Acute Office Visit  Subjective:    Patient ID: Mitchell Humphrey, male    DOB: 1941-04-15, 80 y.o.   MRN: [id number]  Chief Complaint  Patient presents with  . Abdominal Pain    Pt c/o of left lower abdominal pain started yesterday. Denies nausea, not constipated. He has not taken anything for the pain.    HPI Patient is in today for LLQ abdominal pain x 2 days. 9/10 with movement or deep breathing. Steady ache 3/10 with sitting. He couldn't sleep last night because of the pain. Woke up with the pain yesterday, steady slow worsening pain since then. Currently taking Eliquis due to recent DVT from COVID-19 infection, therefore he has not tried anything for the pain. Never has had this type of pain before. Regular stools. Appetite has been good per patient and his wife.  Does not radiate to the groin, slight radiation to the back.   No hx of renal stones. No hx of diverticulitis.    Past Medical History:  Diagnosis Date  . Allergy   . Arthritis   . BPH (benign prostatic hyperplasia)   . Cataract   . Dyslipidemia   . Erectile dysfunction   . GERD (gastroesophageal reflux disease)   . Hx of adenomatous colonic polyps   . Hyperlipidemia   . Hypertension   . Hypogonadism male   . Hypothyroid   . Obesity     Past Surgical History:  Procedure Laterality Date  . COLONOSCOPY  08/2008   Dr. Watt Climes  . HERNIA REPAIR  1970   [facility]    Family History  Problem Relation Age of Onset  . Breast cancer Mother   . Lung cancer Father   . Brain cancer Father   . Breast cancer Sister     Social History   Socioeconomic History  . Marital status: Married    Spouse name: Not on file  . Number of children: Not on file  . Years of education: Not on file  . Highest education level: Not on file  Occupational History  . Not on file  Tobacco Use  . Smoking status: Never Smoker  . Smokeless tobacco: Never Used  . Tobacco comment: He chews on a cigar  Vaping Use  . Vaping Use: Never  used  Substance and Sexual Activity  . Alcohol use: No  . Drug use: No  . Sexual activity: Not Currently  Other Topics Concern  . Not on file  Social History Narrative   Superintendent for construction site   Lives with wife   2 step children   2 sons   Social Determinants of Health   Financial Resource Strain: Not on file  Food Insecurity: Not on file  Transportation Needs: Not on file  Physical Activity: Not on file  Stress: Not on file  Social Connections: Not on file  Intimate Partner Violence: Not on file    Outpatient Medications Prior to Visit  Medication Sig Dispense Refill  . albuterol (VENTOLIN HFA) 108 (90 Base) MCG/ACT inhaler Inhale 2 puffs into the lungs every 6 (six) hours as needed for wheezing or shortness of breath. 6.7 g 0  . APIXABAN (ELIQUIS) VTE STARTER PACK (10MG  AND 5MG ) Take as directed on package: start with two-5mg  tablets twice daily for 7 days. On day 8, switch to one-5mg  tablet twice daily. 1 each 0  . atorvastatin (LIPITOR) 20 MG tablet Take 1 tablet (20 mg total) by mouth daily. 90 tablet 1  . benzonatate (TESSALON  PERLES) 100 MG capsule Take 1 capsule (100 mg total) by mouth every 6 (six) hours as needed for cough. 60 capsule 1  . BLACK ELDERBERRY PO Take 1 capsule by mouth daily.    . cetirizine (ZYRTEC) 10 MG tablet Take 1 tablet (10 mg total) by mouth as needed. (Patient taking differently: Take 10 mg by mouth daily.) 90 tablet 3  . Cholecalciferol (VITAMIN D3) 125 MCG (5000 UT) CAPS Take 1 capsule by mouth daily in the afternoon.    . enalapril (VASOTEC) 10 MG tablet Take 10 mg by mouth daily.    . EUTHYROX 150 MCG tablet TAKE 1 TABLET BY MOUTH ONCE DAILY BEFORE BREAKFAST 30 tablet 0  . famotidine (PEPCID) 20 MG tablet Take 20 mg by mouth daily.    . finasteride (PROSCAR) 5 MG tablet Take 1 tablet (5 mg total) by mouth daily. 90 tablet 1  . fluticasone (FLONASE) 50 MCG/ACT nasal spray Place 2 sprays into both nostrils daily. 16 g 2  .  hydrochlorothiazide (HYDRODIURIL) 25 MG tablet Take 25 mg by mouth daily.    . Multiple Vitamin (MULTIVITAMIN) tablet Take 1 tablet by mouth daily.    Marland Kitchen MYRBETRIQ 25 MG TB24 tablet Take 25 mg by mouth daily.    . predniSONE (DELTASONE) 10 MG tablet Take 40 mg daily for 1 day, 30 mg daily for 1 day, 20 mg daily for 1 days,10 mg daily for 1 day, then stop 10 tablet 0  . tamsulosin (FLOMAX) 0.4 MG CAPS capsule Take 0.4 mg by mouth daily.    . vitamin A 3 MG (10000 UNITS) capsule Take 10,000 Units by mouth daily.    . vitamin C (ASCORBIC ACID) 500 MG tablet Take 500 mg by mouth daily.     No facility-administered medications prior to visit.    Allergies  Allergen Reactions  . Penicillins     Review of Systems  Constitutional: Positive for fatigue and unexpected weight change (5 lb loss in 3 days since Monday's visit). Negative for activity change and appetite change.  Respiratory: Negative for shortness of breath and wheezing.   Cardiovascular: Negative for chest pain and leg swelling.  Gastrointestinal: Positive for abdominal pain. Negative for blood in stool, constipation, diarrhea, nausea and vomiting.  Genitourinary: Negative for dysuria, flank pain, frequency and hematuria.  Musculoskeletal: Negative for back pain and gait problem.  Skin: Negative for rash.  Neurological: Negative for headaches.  Psychiatric/Behavioral: Negative for confusion.       Objective:    Physical Exam Vitals and nursing note reviewed.  Constitutional:      General: He is not in acute distress (slightly uncomfortable appearing).    Appearance: He is well-developed. He is obese. He is not ill-appearing, toxic-appearing or diaphoretic.  HENT:     Head: Normocephalic.     Mouth/Throat:     Mouth: Mucous membranes are moist.  Eyes:     Extraocular Movements: Extraocular movements intact.     Pupils: Pupils are equal, round, and reactive to light.  Cardiovascular:     Rate and Rhythm: Normal rate and  regular rhythm.     Heart sounds: Murmur heard.    Pulmonary:     Effort: Pulmonary effort is normal. No respiratory distress.     Breath sounds: Normal breath sounds. No wheezing.  Abdominal:     General: There is no distension.     Palpations: Abdomen is soft. There is no fluid wave, hepatomegaly, splenomegaly or mass.  Tenderness: There is abdominal tenderness in the left upper quadrant. There is no right CVA tenderness, left CVA tenderness, guarding or rebound. Negative signs include Murphy's sign, Rovsing's sign and McBurney's sign.  Skin:    General: Skin is warm and dry.  Neurological:     General: No focal deficit present.     Mental Status: He is alert and oriented to person, place, and time.  Psychiatric:        Mood and Affect: Mood is anxious.     BP 100/60 (BP Location: Left Arm, Patient Position: Sitting, Cuff Size: Large)   Pulse 94   Temp 98.5 F (36.9 C) (Temporal)   Ht 5\' 11"  (1.803 m)   Wt 238 lb 6.1 oz (108.1 kg)   SpO2 95%   BMI 33.25 kg/m    Wt Readings from Last 3 Encounters:  12/26/20 238 lb 6.1 oz (108.1 kg)  12/23/20 243 lb 4 oz (110.3 kg)  12/19/20 245 lb 9.6 oz (111.4 kg)     Lab Results  Component Value Date   TSH 0.681 12/16/2020   Lab Results  Component Value Date   WBC 15.1 (H) 12/23/2020   HGB 13.5 12/23/2020   HCT 41.8 12/23/2020   MCV 81.6 12/23/2020   PLT 417.0 (H) 12/23/2020   Lab Results  Component Value Date   NA 134 (L) 12/23/2020   K 4.3 12/23/2020   CO2 27 12/23/2020   GLUCOSE 83 12/23/2020   BUN 24 (H) 12/23/2020   CREATININE 0.78 12/23/2020   BILITOT 0.5 12/23/2020   ALKPHOS 51 12/23/2020   AST 26 12/23/2020   ALT 81 (H) 12/23/2020   PROT 6.1 12/23/2020   ALBUMIN 3.4 (L) 12/23/2020   CALCIUM 9.2 12/23/2020   ANIONGAP 9 12/19/2020   GFR 85.53 12/23/2020   Lab Results  Component Value Date   CHOL 234 (H) 05/25/2012   Lab Results  Component Value Date   HDL 41 05/25/2012   Lab Results  Component  Value Date   LDLCALC 152 (H) 05/25/2012   Lab Results  Component Value Date   TRIG 121 12/15/2020   Lab Results  Component Value Date   CHOLHDL 5.7 05/25/2012      Assessment & Plan:   Problem List Items Addressed This Visit      Other   COVID-19 virus infection    Other Visit Diagnoses    Left sided abdominal pain    -  Primary   Relevant Orders   POCT urinalysis dipstick (Completed)   Left upper quadrant abdominal pain       Relevant Orders   CT Abdomen Pelvis W Contrast   Abnormal weight loss          1. Left sided abdominal pain U/A checked immediately in office and found to be negative.  Urinalysis    Component Value Date/Time   BILIRUBINUR negative 12/26/2020 1626   PROTEINUR Negative 12/26/2020 1626   UROBILINOGEN 0.2 12/26/2020 1626   NITRITE negative 12/26/2020 1626   LEUKOCYTESUR Negative 12/26/2020 1626    2. Left upper quadrant abdominal pain Upon my physical exam, his pain was found to be isolated to the LUQ. Discussed with Dr. Rogers Blocker, who agreed given his acutely worsening symptoms and recent COVID-19 hospitalization, STAT CT abdomen with contrast is warranted to r/o pathologies such as ischemia or infarction, abscess, etc. If his pain suddenly worsens or symptoms change prior to this imaging appointment, patient and his wife will go straight to the ED.  3. Abnormal weight loss 5 pound weight loss since in-office visit on 12/23/20. This could be some ongoing deconditioning due to COVID-19 infection, or a results of the pathology from #2. His labs were stable from hospital discharge. Did not see immediate need to repeat labs today, but will monitor weight closely.  4. COVID-19 virus infection Day #24 since positive test. Stable since Monday's visit. He is not wearing his oxygen in office today and feels like his breathing is doing well. SpO2 95% on room air.   This visit occurred during the SARS-CoV-2 public health emergency.  Safety protocols were in  place, including screening questions prior to the visit, additional usage of staff PPE, and extensive cleaning of exam room while observing appropriate contact time as indicated for disinfecting solutions.    Kashius Dominic M Kyshon Tolliver, PA-C

## 2020-12-26 NOTE — Telephone Encounter (Signed)
Pt is coming today at 4:00 PM to see you.

## 2020-12-26 NOTE — Patient Instructions (Signed)
You will be scheduled for a STAT CT of the abdomen and provided with information on when and where to do this.  Go to the Emergency Dept immediately this evening if acutely worsening pain or development of fever, chills, sudden weakness, or any other concerning change in symptoms.

## 2020-12-26 NOTE — Telephone Encounter (Signed)
Nurse Assessment Nurse: Rock Nephew, RN, Juliann Pulse Date/Time (Eastern Time): 12/26/2020 7:42:47 AM Confirm and document reason for call. If symptomatic, describe symptoms. ---Caller states that her husband has left side pain that has kept him up most of the night. She is concerned he needs to be seen right away. She states that his pain is severe. Does the patient have any new or worsening symptoms? ---Yes Will a triage be completed? ---No Select reason for no triage. ---Other Please document clinical information provided and list any resource used. ---Patient is unavailable to speak with right now, he is having a scheduled EKG done. I advised caller to call back right away when his test is completed . Disp. Time Eilene Ghazi Time) Disposition Final User 12/26/2020 7:40:53 AM Send to Urgent Queue Wynema Birch 12/26/2020 7:44:11 AM Clinical Call Yes Rock Nephew, RN, Dierdre Harness

## 2020-12-27 ENCOUNTER — Ambulatory Visit (HOSPITAL_COMMUNITY): Payer: Medicare Other

## 2020-12-27 ENCOUNTER — Ambulatory Visit (HOSPITAL_COMMUNITY)
Admission: RE | Admit: 2020-12-27 | Discharge: 2020-12-27 | Disposition: A | Discharge status: Home / Self Care | Payer: Medicare Other | Source: Ambulatory Visit | Attending: Physician Assistant | Admitting: Physician Assistant

## 2020-12-27 ENCOUNTER — Ambulatory Visit (HOSPITAL_COMMUNITY): Admission: RE | Admit: 2020-12-27 | Payer: Medicare Other | Source: Ambulatory Visit

## 2020-12-27 DIAGNOSIS — R1012 Left upper quadrant pain: Secondary | ICD-10-CM | POA: Diagnosis present

## 2020-12-27 MED ORDER — IOHEXOL 300 MG/ML  SOLN
100.0000 mL | Freq: Once | INTRAMUSCULAR | Status: AC | PRN
  Administered 2020-12-27: 100 mL via INTRAVENOUS

## 2020-12-30 ENCOUNTER — Telehealth: Payer: Self-pay

## 2020-12-30 NOTE — Telephone Encounter (Signed)
[phone]  Please give number above a call in regards to pt.'s ct scan

## 2020-12-30 NOTE — Telephone Encounter (Signed)
Pt calling about CT results.

## 2020-12-30 NOTE — Progress Notes (Signed)
Patient is scheduled for [facility] on Monday.

## 2020-12-30 NOTE — Telephone Encounter (Signed)
Spoke with patient wife about results. See result note

## 2020-12-30 NOTE — Telephone Encounter (Signed)
Called patient back and LMOVM

## 2021-01-02 ENCOUNTER — Telehealth (INDEPENDENT_AMBULATORY_CARE_PROVIDER_SITE_OTHER): Payer: Medicare Other | Admitting: Physician Assistant

## 2021-01-02 ENCOUNTER — Encounter: Payer: Self-pay | Admitting: Physician Assistant

## 2021-01-02 VITALS — HR 111 | Temp 99.0°F

## 2021-01-02 DIAGNOSIS — U099 Post covid-19 condition, unspecified: Secondary | ICD-10-CM | POA: Diagnosis not present

## 2021-01-02 NOTE — Progress Notes (Signed)
Virtual Visit via Telephone Note  I connected with Mitchell Humphrey on 01/02/21 at  9:00 AM EST by telephone and verified that I am speaking with the correct person using two identifiers.  Location: Patient: home with wife, Mitchell Humphrey, present for call as well Provider: [place], office   I discussed the limitations, risks, security and privacy concerns of performing an evaluation and management service by telephone and the availability of in person appointments. I also discussed with the patient that there may be a patient responsible charge related to this service. The patient expressed understanding and agreed to proceed.   History of Present Illness:  80 year old pleasant male presents for phone visit today with his wife Mitchell Humphrey.  Recent hospital discharge due to COVID-19.  Last seen in the office on 12/26/2020 by me for left-sided upper abdominal pain.  He had a CT scan that was negative and states that pain has completely resolved.  Reason further call today is that last night he said after dinner he "did not feel like himself".  He felt dizzy and felt like he was starting to get sick and lightheaded.  His pulse was 111 bpm and his temperature was 99 F.  He also developed a congested sounding cough.  He was able to rest through the night, but wife says she watched him very closely throughout the night.  He took a shower by himself this morning and did not feel dizzy.  He says he is feeling much better right now.  He is still wearing 3 L of oxygen as needed.  His appetite is not quite back to normal since having Covid, but says he is ready to eat some food this morning.  No current CP, no SOB, no abdominal pain. No nausea or vomiting. No fever, chills, dizziness, lightheadedness.   Observations/Objective: Weight: 234.8 lbs SpO2 96% on 3L HR 98 bpm Temp 98.4 F  He sounds slightly congested, but overall speaking clearly and intelligibly, without pauses or gasps for breath. He is  A&Ox3.    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  The symptoms may wax and wane.  I strongly encouraged him to push fluids today aiming for about 80 to 100 ounces.  They are going to monitor his vital signs very closely today.  Should his oxygen dropped below 90% or his heart rate go above 100 and stay there or he develops any sudden symptoms, they are to go straight to the emergency department.  He has a follow-up appointment with Aldona Bar his PCP on Monday.  They are going to call with an update for [place] if needed before then.   Follow Up Instructions:    I discussed the assessment and treatment plan with the patient. The patient was provided an opportunity to ask questions and all were answered. The patient agreed with the plan and demonstrated an understanding of the instructions.   The patient was advised to call back or seek an in-person evaluation if the symptoms worsen or if the condition fails to improve as anticipated.  I provided 16 minutes of non-face-to-face time during this encounter.   Alexiz Cothran M Tashae Inda, PA-C

## 2021-01-02 NOTE — Patient Instructions (Signed)
Keep f/up on Monday with Mitchell Humphrey. Straight to ED this weekend if any CP, SOB, dizziness, or severe fatigue.  Call with update on vitals should anything change prior to Monday.

## 2021-01-03 ENCOUNTER — Other Ambulatory Visit: Payer: Self-pay

## 2021-01-03 ENCOUNTER — Other Ambulatory Visit: Payer: Self-pay | Admitting: Physician Assistant

## 2021-01-03 ENCOUNTER — Telehealth: Payer: Self-pay

## 2021-01-03 DIAGNOSIS — I10 Essential (primary) hypertension: Secondary | ICD-10-CM

## 2021-01-03 DIAGNOSIS — R000 Tachycardia, unspecified: Secondary | ICD-10-CM

## 2021-01-03 MED ORDER — ALBUTEROL SULFATE HFA 108 (90 BASE) MCG/ACT IN AERS: 2.0000 | INHALATION_SPRAY | Freq: Four times a day (QID) | RESPIRATORY_TRACT | 0 refills | Status: DC | PRN

## 2021-01-03 NOTE — Telephone Encounter (Signed)
I have sent in new referral for patient to be seen urgently.

## 2021-01-03 NOTE — Telephone Encounter (Signed)
Left voice message for patient to call clinic.  

## 2021-01-03 NOTE — Telephone Encounter (Signed)
Pt.'s wife is requesting a call in regards to a few questions she states. She did not go into detail when I asked.

## 2021-01-03 NOTE — Telephone Encounter (Signed)
Patient wife stated Mitchell Humphrey is doing fine his pulse 86,She wants to know if Dan Maker can get his Cardiologist appt moved up sooner than 02/05/21. Also wants a refill on albuterol. Rx sent in

## 2021-01-05 NOTE — Progress Notes (Signed)
Cardiology Office Note:   Date:  01/07/2021  NAME:  Mitchell Humphrey    MRN: [id number] DOB:  1941/04/04   PCP:  Inda Coke, PA  Cardiologist:  No primary care provider on file.  Electrophysiologist:  None   Referring MD: Inda Coke, PA   Chief Complaint  Patient presents with  . Aortic Stenosis         History of Present Illness:   Mitchell Humphrey is a 80 y.o. male with a hx of Covid Pneumonia (11/2019) c/b by DVT, HTN, HLD who presents for the evaluation of aortic stenosis/chest pain at the request of Inda Coke, [place]. Found to have moderate AS.  He reports since his diagnosis of Covid he has been fatigued.  He reports no exertional shortness of breath he has been tired.  He also reports intermittent chest pain.  Described as sharp.  Occurs beneath the left chest.  Can last for 1 to 2 minutes.  Goes away on its own.  Is not exertional.  Not alleviated by rest.  He is still working as a Insurance account manager.  Reports it has been difficult to work.  He was still intermittently requiring oxygen.  He reports he is weaning himself off.  O2 sats 94.  Heart rate 101 bpm.  He reports he has noticed his heart racing fast especially when oxygen levels are low.  He denies any shortness of breath.  Symptoms appear to be fatigue and intermittent chest pain.  We discussed his echocardiogram which shows moderate aortic stenosis.  Nothing needs to be done about this now.  He will need a repeat echocardiogram in 1 year.  He underwent a CT PE study which does show coronary calcifications.  He reports his twin sister had a heart attack.  CVD risk include hypertension which is well controlled.  He also has hyperlipidemia.  He is on Lipitor.  No recent lipid profile in a number of years.  He is a former smoker.  He does not drink alcohol.  No drugs reported.  He is still working as a Insurance account manager.  Problem List 1. DVT -L gastrocnemius vein 12/17/2019 -in setting of  Covid PNA 2. Moderate AS -V max 2.8 m/s, MG 22 mmHG, AVA 1.38 cm2, DI 0.33 (12/26/2020) 3. HTN 4. HLD -needs re-check 5.  Coronary calcification seen on CT -CT PE study 12/27/2020  Past Medical History: Past Medical History:  Diagnosis Date  . Allergy   . Arthritis   . BPH (benign prostatic hyperplasia)   . Cataract   . Dyslipidemia   . Erectile dysfunction   . GERD (gastroesophageal reflux disease)   . Hx of adenomatous colonic polyps   . Hyperlipidemia   . Hypertension   . Hypogonadism male   . Hypothyroid   . Obesity     Past Surgical History: Past Surgical History:  Procedure Laterality Date  . COLONOSCOPY  08/2008   Dr. Watt Climes  . HERNIA REPAIR  1970   [facility]    Current Medications: Current Meds  Medication Sig  . albuterol (VENTOLIN HFA) 108 (90 Base) MCG/ACT inhaler Inhale 2 puffs into the lungs every 6 (six) hours as needed for wheezing or shortness of breath.  . APIXABAN (ELIQUIS) VTE STARTER PACK (10MG  AND 5MG ) Take as directed on package: start with two-5mg  tablets twice daily for 7 days. On day 8, switch to one-5mg  tablet twice daily.  Marland Kitchen atorvastatin (LIPITOR) 20 MG tablet Take 1 tablet (20 mg total)  by mouth daily.  . benzonatate (TESSALON PERLES) 100 MG capsule Take 1 capsule (100 mg total) by mouth every 6 (six) hours as needed for cough.  Marland Kitchen BLACK ELDERBERRY PO Take 1 capsule by mouth daily.  . cetirizine (ZYRTEC) 10 MG tablet Take 1 tablet (10 mg total) by mouth as needed. (Patient taking differently: Take 10 mg by mouth daily.)  . Cholecalciferol (VITAMIN D3) 125 MCG (5000 UT) CAPS Take 1 capsule by mouth daily in the afternoon.  . enalapril (VASOTEC) 10 MG tablet Take 10 mg by mouth daily.  . EUTHYROX 150 MCG tablet TAKE 1 TABLET BY MOUTH ONCE DAILY BEFORE BREAKFAST  . famotidine (PEPCID) 20 MG tablet Take 20 mg by mouth daily.  . finasteride (PROSCAR) 5 MG tablet Take 1 tablet (5 mg total) by mouth daily.  . fluticasone (FLONASE) 50 MCG/ACT nasal spray  Place 2 sprays into both nostrils daily.  . hydrochlorothiazide (HYDRODIURIL) 25 MG tablet Take 25 mg by mouth daily.  . Multiple Vitamin (MULTIVITAMIN) tablet Take 1 tablet by mouth daily.  Marland Kitchen MYRBETRIQ 25 MG TB24 tablet Take 25 mg by mouth daily.  . tamsulosin (FLOMAX) 0.4 MG CAPS capsule Take 0.4 mg by mouth daily.  . vitamin A 3 MG (10000 UNITS) capsule Take 10,000 Units by mouth daily.  . vitamin C (ASCORBIC ACID) 500 MG tablet Take 500 mg by mouth daily.     Allergies:    Penicillins   Social History: Social History   Socioeconomic History  . Marital status: Married    Spouse name: Not on file  . Number of children: 1  . Years of education: Not on file  . Highest education level: Not on file  Occupational History  . Occupation: Scientist, research (medical)  Tobacco Use  . Smoking status: Never Smoker  . Smokeless tobacco: Never Used  . Tobacco comment: He chews on a cigar  Vaping Use  . Vaping Use: Never used  Substance and Sexual Activity  . Alcohol use: No  . Drug use: No  . Sexual activity: Not Currently  Other Topics Concern  . Not on file  Social History Narrative   Superintendent for construction site   Lives with wife   2 step children   2 sons   Social Determinants of Health   Financial Resource Strain: Not on file  Food Insecurity: Not on file  Transportation Needs: Not on file  Physical Activity: Not on file  Stress: Not on file  Social Connections: Not on file     Family History: The patient's family history includes Brain cancer in his father; Breast cancer in his mother and sister; Heart attack in his sister; Lung cancer in his father.  ROS:   All other ROS reviewed and negative. Pertinent positives noted in the HPI.     EKGs/Labs/Other Studies Reviewed:   The following studies were personally reviewed by me today:  EKG:  EKG is ordered today.  The ekg ordered today demonstrates sinus tachycardia heart rate 101, no acute ischemic changes,  single PAC noted, and was personally reviewed by me.   TTE 12/26/2020  1. Left ventricular ejection fraction, by estimation, is 60 to 65%. The  left ventricle has normal function. The left ventricle has no regional  wall motion abnormalities. Left ventricular diastolic parameters are  consistent with Grade I diastolic  dysfunction (impaired relaxation).  2. Right ventricular systolic function is normal. The right ventricular  size is normal. Tricuspid regurgitation signal is inadequate for assessing  PA pressure.  3. The mitral valve is normal in structure. No evidence of mitral valve  regurgitation. No evidence of mitral stenosis.  4. The aortic valve is tricuspid. There is severe calcifcation of the  aortic valve. There is severe thickening of the aortic valve. Aortic valve  regurgitation is not visualized. Moderate aortic valve stenosis. Aortic  valve area, by VTI measures 1.38  cm. Aortic valve mean gradient measures 22.0 mmHg. Aortic valve Vmax  measures 2.84 m/s.  5. Aortic dilatation noted. There is mild dilatation of the aortic root,  measuring 39 mm.   Recent Labs: 12/16/2020: TSH 0.681 12/18/2020: Magnesium 2.4 12/23/2020: ALT 81; BUN 24; Creatinine, Ser 0.78; Hemoglobin 13.5; Platelets 417.0; Potassium 4.3; Sodium 134   Recent Lipid Panel    Component Value Date/Time   CHOL 234 (H) 05/25/2012 0834   TRIG 121 12/15/2020 1239   HDL 41 05/25/2012 0834   CHOLHDL 5.7 05/25/2012 0834   VLDL 41 (H) 05/25/2012 0834   LDLCALC 152 (H) 05/25/2012 0834    Physical Exam:   VS:  BP 100/60   Pulse (!) 101   Ht 5\' 11"  (1.803 m)   Wt 236 lb 9.6 oz (107.3 kg)   SpO2 94%   BMI 33.00 kg/m    Wt Readings from Last 3 Encounters:  01/07/21 236 lb 9.6 oz (107.3 kg)  01/06/21 238 lb (108 kg)  12/26/20 238 lb 6.1 oz (108.1 kg)    General: Well nourished, well developed, in no acute distress Head: Atraumatic, normal size  Eyes: PEERLA, EOMI  Neck: Supple, no JVD Endocrine: No  thryomegaly Cardiac: Normal S1, S2; RRR; 2 out of 6 systolic ejection murmur, early peaking Lungs: Clear to auscultation bilaterally, no wheezing, rhonchi or rales  Abd: Soft, nontender, no hepatomegaly  Ext: No edema, pulses 2+ Musculoskeletal: No deformities, BUE and BLE strength normal and equal Skin: Warm and dry, no rashes   Neuro: Alert and oriented to person, place, time, and situation, CNII-XII grossly intact, no focal deficits  Psych: Normal mood and affect   ASSESSMENT:   Mitchell Humphrey is a 80 y.o. male who presents for the following: 1. Chest pain of uncertain etiology   2. Aortic stenosis, moderate   3. Acute deep vein thrombosis (DVT) of left lower extremity, unspecified vein (HCC)   4. Primary hypertension   5. Chest pain, unspecified type     PLAN:   1. Chest pain of uncertain etiology -Atypical chest pain.  EKG shows sinus tachycardia with no acute ischemic change or evidence of infarction.  Similar surgical hypertension, hyperlipidemia and advanced age.  He is also known to have coronary calcifications on recent CT chest.  Symptoms are infrequent and atypical.  I recommended an exercise treadmill stress test just to make sure were not missing coronary disease. -His aortic stenosis does not explain his chest pain. -Symptoms of Covid appear to be fatigue.  2. Aortic stenosis, moderate -Moderate aortic stenosis on recent echo.  Plan to recheck in 1 year and he will see me back.  3. Acute deep vein thrombosis (DVT) of left lower extremity, unspecified vein (HCC) -DVT diagnosis 7 COVID-19 pneumonia.  36.5 correlation per primary care physician.  4. Primary hypertension -Well-controlled on current medications.  Shared Decision Making/Informed Consent The risks [chest pain, shortness of breath, cardiac arrhythmias, dizziness, blood pressure fluctuations, myocardial infarction, stroke/transient ischemic attack, nausea, vomiting, allergic reaction, radiation exposure,  metallic taste sensation and life-threatening complications (estimated to be 1 in 10,000)], benefits (  risk stratification, diagnosing coronary artery disease, treatment guidance) and alternatives of a nuclear stress test were discussed in detail with Mitchell Humphrey and he agrees to proceed.    Disposition: Return in about 1 year (around 01/07/2022).  Medication Adjustments/Labs and Tests Ordered: Current medicines are reviewed at length with the patient today.  Concerns regarding medicines are outlined above.  Orders Placed This Encounter  Procedures  . Cardiac Stress Test: Informed Consent Details: Physician/Practitioner Attestation; Transcribe to consent form and obtain patient signature  . MYOCARDIAL PERFUSION IMAGING  . EKG 12-Lead  . ECHOCARDIOGRAM COMPLETE   No orders of the defined types were placed in this encounter.   Patient Instructions  Medication Instructions:  The current medical regimen is effective;  continue present plan and medications.  *If you need a refill on your cardiac medications before your next appointment, please call your pharmacy*   Testing/Procedures: Your physician has requested that you have an Exercise Myoview. A cardiac stress test is a cardiological test that measures the heart's ability to respond to external stress in a controlled clinical environment. The stress response is induced by exercise (exercise-treadmill). For further information please visit [email or web address]. If you have questions or concerns about your appointment, you can call the Nuclear Lab at [phone].   Echocardiogram (before 12 month follow up) - Your physician has requested that you have an echocardiogram. Echocardiography is a painless test that uses sound waves to create images of your heart. It provides your doctor with information about the size and shape of your heart and how well your heart's chambers and valves are working. This procedure takes approximately one hour. There  are no restrictions for this procedure. This will be performed at our [facility] location - [address].   Follow-Up: At [facility], you and your health needs are our priority.  As part of our continuing mission to provide you with exceptional heart care, we have created designated Provider Care Teams.  These Care Teams include your primary Cardiologist (physician) and Advanced Practice Providers (APPs -  Physician Assistants and Nurse Practitioners) who all work together to provide you with the care you need, when you need it.  We recommend signing up for the patient portal called "MyChart".  Sign up information is provided on this After Visit Summary.  MyChart is used to connect with patients for Virtual Visits (Telemedicine).  Patients are able to view lab/test results, encounter notes, upcoming appointments, etc.  Non-urgent messages can be sent to your provider as well.   To learn more about what you can do with MyChart, go to [email or web address].    Your next appointment:   12 month(s)  The format for your next appointment:   In Person  Provider:   Eleonore Chiquito, MD          Signed, Addison Naegeli. Audie Box, [place]  [address], [place], [place] [address] [phone]  01/07/2021 12:00 PM

## 2021-01-06 ENCOUNTER — Ambulatory Visit: Payer: Medicare Other | Admitting: Physician Assistant

## 2021-01-06 ENCOUNTER — Other Ambulatory Visit: Payer: Self-pay

## 2021-01-06 ENCOUNTER — Encounter: Payer: Self-pay | Admitting: Physician Assistant

## 2021-01-06 VITALS — BP 110/70 | HR 101 | Temp 98.5°F | Ht 71.0 in | Wt 238.0 lb

## 2021-01-06 DIAGNOSIS — U099 Post covid-19 condition, unspecified: Secondary | ICD-10-CM | POA: Diagnosis not present

## 2021-01-06 DIAGNOSIS — R350 Frequency of micturition: Secondary | ICD-10-CM | POA: Diagnosis not present

## 2021-01-06 DIAGNOSIS — R634 Abnormal weight loss: Secondary | ICD-10-CM

## 2021-01-06 DIAGNOSIS — N401 Enlarged prostate with lower urinary tract symptoms: Secondary | ICD-10-CM | POA: Diagnosis not present

## 2021-01-06 LAB — PSA: PSA: 1.1 ng/mL (ref 0.10–4.00)

## 2021-01-06 LAB — HEMOGLOBIN A1C: Hgb A1c MFr Bld: 7.2 % — ABNORMAL HIGH (ref 4.6–6.5)

## 2021-01-06 NOTE — Progress Notes (Signed)
Mitchell Humphrey is a 80 y.o. male is here for follow up.  I acted as a Education administrator for [employer], PA-C Anselmo Pickler, LPN   History of Present Illness:   Chief Complaint  Patient presents with  . f/u COVID    HPI   COVID-19 Follow-up Pt here for follow up. He is without his oxygen today. Pt states he has been only using it at night for the past few days. Oxygen level stable during the day at 94%. Pt denies SOB, has coughing spells off and on. Has appt with cardiology tomorrow to further evaluate fluctuations in heart rate as well as his echo results.  Wt Readings from Last 4 Encounters:  01/06/21 238 lb (108 kg)  12/26/20 238 lb 6.1 oz (108.1 kg)  12/23/20 243 lb 4 oz (110.3 kg)  12/19/20 245 lb 9.6 oz (111.4 kg)   Still has altered taste and early satiety. Weight is starting to stabilize. He reports that he is trying his best to maximize protein. Planning for PT soon at some point.  BPH He is taking flomax 0,4 mg daily and proscar 5 mg daily but still having significant urination at night. He is interested in seeing a new urologist, apparently [facility] of network for [facility].   Health Maintenance Due  Topic Date Due  . Hepatitis C Screening  Never done  . COVID-19 Vaccine (3 - Booster for Moderna series) 09/23/2020    Past Medical History:  Diagnosis Date  . Allergy   . Arthritis   . BPH (benign prostatic hyperplasia)   . Cataract   . Dyslipidemia   . Erectile dysfunction   . GERD (gastroesophageal reflux disease)   . Hx of adenomatous colonic polyps   . Hyperlipidemia   . Hypertension   . Hypogonadism male   . Hypothyroid   . Obesity      Social History   Tobacco Use  . Smoking status: Never Smoker  . Smokeless tobacco: Never Used  . Tobacco comment: He chews on a cigar  Vaping Use  . Vaping Use: Never used  Substance Use Topics  . Alcohol use: No  . Drug use: No    Past Surgical History:  Procedure Laterality Date  . COLONOSCOPY  08/2008    Dr. Watt Climes  . HERNIA REPAIR  1970   [facility]    Family History  Problem Relation Age of Onset  . Breast cancer Mother   . Lung cancer Father   . Brain cancer Father   . Breast cancer Sister     PMHx, SurgHx, SocialHx, FamHx, Medications, and Allergies were reviewed in the Visit Navigator and updated as appropriate.   Patient Active Problem List   Diagnosis Date Noted  . Acute hypoxemic respiratory failure due to COVID-19 ([place]) 12/15/2020  . COVID-19 virus infection 12/07/2020  . Insulin resistance 10/23/2020  . Severe obesity (BMI 35.0-35.9 with comorbidity) ([place]) 08/05/2020  . 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 GERD (gastroesophageal reflux disease) 12/20/2018  . Chronic low back pain 05/18/2018  . Hypercholesterolemia 08/09/2017  . BPH (benign prostatic hyperplasia) 08/09/2017  . Osteoarthritis of both hands 08/09/2017  . 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 History of melanoma 02/04/2015  . Elevated prostate specific antigen (PSA) 03/15/2013  . Increased frequency of urination 03/15/2013  . Urge incontinence 03/15/2013  . Benign essential hypertension 05/25/2012  . Hypothyroidism (acquired) 05/25/2012  . Dyslipidemia 05/25/2012  . Hypogonadism male 05/25/2012    Social History   Tobacco  Use  . Smoking status: Never Smoker  . Smokeless tobacco: Never Used  . Tobacco comment: He chews on a cigar  Vaping Use  . Vaping Use: Never used  Substance Use Topics  . Alcohol use: No  . Drug use: No    Current Medications and Allergies:    Current Outpatient Medications:  .  albuterol (VENTOLIN HFA) 108 (90 Base) MCG/ACT inhaler, Inhale 2 puffs into the lungs every 6 (six) hours as needed for wheezing or shortness of breath., Disp: 6.7 g, Rfl: 0 .  APIXABAN (ELIQUIS) VTE STARTER PACK (10MG  AND 5MG ), Take as directed on package: start with two-5mg  tablets twice daily for 7 days. On day 8, switch to one-5mg  tablet twice daily., Disp: 1 each, Rfl: 0 .   atorvastatin (LIPITOR) 20 MG tablet, Take 1 tablet (20 mg total) by mouth daily., Disp: 90 tablet, Rfl: 1 .  benzonatate (TESSALON PERLES) 100 MG capsule, Take 1 capsule (100 mg total) by mouth every 6 (six) hours as needed for cough., Disp: 60 capsule, Rfl: 1 .  BLACK ELDERBERRY PO, Take 1 capsule by mouth daily., Disp: , Rfl:  .  cetirizine (ZYRTEC) 10 MG tablet, Take 1 tablet (10 mg total) by mouth as needed. (Patient taking differently: Take 10 mg by mouth daily.), Disp: 90 tablet, Rfl: 3 .  Cholecalciferol (VITAMIN D3) 125 MCG (5000 UT) CAPS, Take 1 capsule by mouth daily in the afternoon., Disp: , Rfl:  .  enalapril (VASOTEC) 10 MG tablet, Take 10 mg by mouth daily., Disp: , Rfl:  .  EUTHYROX 150 MCG tablet, TAKE 1 TABLET BY MOUTH ONCE DAILY BEFORE BREAKFAST, Disp: 30 tablet, Rfl: 0 .  famotidine (PEPCID) 20 MG tablet, Take 20 mg by mouth daily., Disp: , Rfl:  .  finasteride (PROSCAR) 5 MG tablet, Take 1 tablet (5 mg total) by mouth daily., Disp: 90 tablet, Rfl: 1 .  fluticasone (FLONASE) 50 MCG/ACT nasal spray, Place 2 sprays into both nostrils daily., Disp: 16 g, Rfl: 2 .  hydrochlorothiazide (HYDRODIURIL) 25 MG tablet, Take 25 mg by mouth daily., Disp: , Rfl:  .  Multiple Vitamin (MULTIVITAMIN) tablet, Take 1 tablet by mouth daily., Disp: , Rfl:  .  MYRBETRIQ 25 MG TB24 tablet, Take 25 mg by mouth daily., Disp: , Rfl:  .  tamsulosin (FLOMAX) 0.4 MG CAPS capsule, Take 0.4 mg by mouth daily., Disp: , Rfl:  .  vitamin A 3 MG (10000 UNITS) capsule, Take 10,000 Units by mouth daily., Disp: , Rfl:  .  vitamin C (ASCORBIC ACID) 500 MG tablet, Take 500 mg by mouth daily., Disp: , Rfl:    Allergies  Allergen Reactions  . Penicillins     Review of Systems   ROS  Negative unless otherwise specified per HPI.  Vitals:   Vitals:   01/06/21 1330  BP: 110/70  Pulse: (!) 101  Temp: 98.5 F (36.9 C)  TempSrc: Temporal  SpO2: 95%  Weight: 238 lb (108 kg)  Height: 5\' 11"  (1.803 m)      Body mass index is 33.19 kg/m.   Physical Exam:    Physical Exam Vitals and nursing note reviewed.  Constitutional:      General: He is not in acute distress.    Appearance: He is well-developed and well-nourished. He is not ill-appearing, toxic-appearing or sickly-appearing.  HENT:     Head: Normocephalic and atraumatic.  Cardiovascular:     Rate and Rhythm: Normal rate and regular rhythm.  Heart sounds: Murmur heard.    Pulmonary:     Effort: Pulmonary effort is normal. No accessory muscle usage or respiratory distress.     Breath sounds: Normal breath sounds.  Skin:    General: Skin is warm, dry and intact.  Neurological:     Mental Status: He is alert.  Psychiatric:        Mood and Affect: Mood and affect normal.        Speech: Speech normal.        Behavior: Behavior is cooperative.      Assessment and Plan:    Taishaun was seen today for f/u covid.  Diagnoses and all orders for this visit:  Benign prostatic hyperplasia with urinary frequency Referral for urology per patient request. Update PSA today. -     PSA  Abnormal weight loss Stabilizing. Will update HgbA1c to assess blood sugar control, although with recent COVID-19 hospitalization and use of high dose steroids numbers may not be most accurate. We discussed maximizing protein intake at each meal and handout provided on best protein options. 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 NAD at today's visit. Continue oxygen at night until sees pulmonary. I thought a referral was placed prior to his hospital d/c but I do not see this. Will place new referral. Physical therapy ordered and hoping to be scheduled soon -- phone number provided. -     Ambulatory referral to Pulmonology  CMA or LPN served as scribe during this visit. History, Physical, and Plan performed by medical provider. The above documentation has been reviewed and is accurate and complete.  Time spent  with patient today was 25 minutes which consisted of chart review, discussing diagnosis, work up, treatment answering questions and documentation.   Inda Coke, PA-C [place], [place] 01/06/2021  Follow-up: No follow-ups on file.

## 2021-01-06 NOTE — Patient Instructions (Signed)
It was great to see you!  For physical therapy, please call the [facility] location, as our location does not accept your insurance for PT. They have called and tried to leave you a voicemail but your mailbox was full.  [facility] at [facility]. [place],    [address] Main: [phone]  Try to continue to push protein intake.  I am putting in referral for the urologist.  Let's follow-up after you see the pulmonologist to update your weight and check in, sooner if concerns.  If a referral was placed today, you will be contacted for an appointment. Please note that routine referrals can sometimes take up to 3-4 weeks to process. Please call our office if you haven't heard anything after this time frame.  Take care,  Inda Coke PA-C

## 2021-01-07 ENCOUNTER — Encounter: Payer: Self-pay | Admitting: Cardiovascular Disease

## 2021-01-07 ENCOUNTER — Ambulatory Visit (INDEPENDENT_AMBULATORY_CARE_PROVIDER_SITE_OTHER): Payer: Medicare Other | Admitting: Cardiovascular Disease

## 2021-01-07 ENCOUNTER — Inpatient Hospital Stay (HOSPITAL_COMMUNITY)
Admission: RE | Admit: 2021-01-07 | Discharge: 2021-01-07 | Disposition: A | Discharge status: Home / Self Care | Payer: Medicare Other | Source: Ambulatory Visit

## 2021-01-07 VITALS — BP 100/60 | HR 101 | Ht 71.0 in | Wt 236.6 lb

## 2021-01-07 DIAGNOSIS — I82402 Acute embolism and thrombosis of unspecified deep veins of left lower extremity: Secondary | ICD-10-CM | POA: Diagnosis not present

## 2021-01-07 DIAGNOSIS — R079 Chest pain, unspecified: Secondary | ICD-10-CM

## 2021-01-07 DIAGNOSIS — I10 Essential (primary) hypertension: Secondary | ICD-10-CM

## 2021-01-07 DIAGNOSIS — I350 Nonrheumatic aortic (valve) stenosis: Secondary | ICD-10-CM | POA: Diagnosis not present

## 2021-01-07 NOTE — Patient Instructions (Signed)
Medication Instructions:  The current medical regimen is effective;  continue present plan and medications.  *If you need a refill on your cardiac medications before your next appointment, please call your pharmacy*   Testing/Procedures: Your physician has requested that you have an Exercise Myoview. A cardiac stress test is a cardiological test that measures the heart's ability to respond to external stress in a controlled clinical environment. The stress response is induced by exercise (exercise-treadmill). For further information please visit [email or web address]. If you have questions or concerns about your appointment, you can call the Nuclear Lab at [phone].   Echocardiogram (before 12 month follow up) - Your physician has requested that you have an echocardiogram. Echocardiography is a painless test that uses sound waves to create images of your heart. It provides your doctor with information about the size and shape of your heart and how well your heart's chambers and valves are working. This procedure takes approximately one hour. There are no restrictions for this procedure. This will be performed at our [facility] location - [address], [address].   Follow-Up: At [facility], you and your health needs are our priority.  As part of our continuing mission to provide you with exceptional heart care, we have created designated Provider Care Teams.  These Care Teams include your primary Cardiologist (physician) and Advanced Practice Providers (APPs -  Physician Assistants and Nurse Practitioners) who all work together to provide you with the care you need, when you need it.  We recommend signing up for the patient portal called "MyChart".  Sign up information is provided on this After Visit Summary.  MyChart is used to connect with patients for Virtual Visits (Telemedicine).  Patients are able to view lab/test results, encounter notes, upcoming appointments, etc.  Non-urgent messages  can be sent to your provider as well.   To learn more about what you can do with MyChart, go to [email or web address].    Your next appointment:   12 month(s)  The format for your next appointment:   In Person  Provider:   Eleonore Chiquito, MD

## 2021-01-07 NOTE — Progress Notes (Signed)
Pt tested positive for covid 12/15/20- does not require pre procedure covid test per 90 day protocol.

## 2021-01-08 ENCOUNTER — Telehealth (HOSPITAL_COMMUNITY): Payer: Self-pay

## 2021-01-08 ENCOUNTER — Telehealth (HOSPITAL_COMMUNITY): Payer: Self-pay | Admitting: *Deleted

## 2021-01-08 ENCOUNTER — Telehealth: Payer: Self-pay

## 2021-01-08 MED ORDER — BENZONATATE 100 MG PO CAPS: 100.0000 mg | ORAL_CAPSULE | Freq: Four times a day (QID) | ORAL | 1 refills | Status: DC | PRN

## 2021-01-08 MED ORDER — APIXABAN 5 MG PO TABS: 5.0000 mg | ORAL_TABLET | Freq: Two times a day (BID) | ORAL | 1 refills | Status: DC

## 2021-01-08 NOTE — Telephone Encounter (Signed)
Pharmacy Transitions of Care Follow-up Telephone Call  Date of discharge: 12/19/20 Discharge Diagnosis: COVID  How have you been since you were released from the hospital? Spoke with patient's wife. He has been struggling with chest pain since discharge and been to the cardiologist multiple times since then. Not having any issues with his medications. Knows s/sx of bleeding to look out for.  Medication changes made at discharge: yes  Medication changes obtained and verified? yes   Medication Accessibility:  Home Pharmacy: [facility] on [address].   Was the patient provided with refills on discharged medications? no  Have all prescriptions been transferred from [facility] to home pharmacy? n/a  . Is the patient able to afford medications? yes    Medication Review:  APIXABEN (ELIQUIS)  Apixaban 10 mg BID initiated on 12/19/20. Will switch to apixaban 5 mg BID after 7 days.  - Discussed importance of taking medication around the same time everyday  - Emphasized importance of monitoring for signs and symptoms of bleeding (abnormal bruising, prolonged bleeding, nose bleeds, bleeding from gums, discolored urine, black tarry stools)  - Advised patient to alert all providers of anticoagulation therapy prior to starting a new medication or having a procedure   Follow-up Appointments:  PCP Hospital f/u appt confirmed? Saw Dr. Morene Rankins in family medicine on 01/06/21, has another f/u on 01/27/21.   [facility] f/u appt confirmed? Seen Cardiologist multiple times since discharge. Next follow up is 01/10/21 with Dr. Harrell Gave   If their condition worsens, is the pt aware to call PCP or go to the Emergency Dept.? yes  Final Patient Assessment: Patient is being followed closely at this time. Messaged PCP for refills on eliquis to be sent to home pharmacy if not done already.

## 2021-01-08 NOTE — Telephone Encounter (Signed)
MEDICATION: bezonatate 100 MG  PH[facility]ACY: [facility]  Comments:   **Let patient know to contact pharmacy at the end of the day to make sure medication is ready. **  ** Please notify patient to allow 48-72 hours to process**  **Encourage patient to contact the pharmacy for refills or they can request refills through [facility]**

## 2021-01-08 NOTE — Telephone Encounter (Signed)
Spoke to pt's wife [name], told her Rx for Atorvastatin was sent to pharmacy along with Rx for Eliquis 5 mg. Told her received message from pharmacy at [facility] that he needed it also. Told her pt needs to continue Eliquis 5 mg twice day. [name] verbalized understanding.

## 2021-01-08 NOTE — Telephone Encounter (Signed)
Close encounter 

## 2021-01-10 ENCOUNTER — Ambulatory Visit (HOSPITAL_COMMUNITY)
Admission: RE | Admit: 2021-01-10 | Discharge: 2021-01-10 | Disposition: A | Discharge status: Home / Self Care | Payer: Medicare Other | Source: Ambulatory Visit | Attending: Cardiology | Admitting: Cardiology

## 2021-01-10 ENCOUNTER — Other Ambulatory Visit: Payer: Self-pay

## 2021-01-10 DIAGNOSIS — R079 Chest pain, unspecified: Secondary | ICD-10-CM | POA: Insufficient documentation

## 2021-01-10 LAB — MYOCARDIAL PERFUSION IMAGING
LV dias vol: 95 mL (ref 62–150)
LV sys vol: 43 mL
Peak HR: 123 {beats}/min
Rest HR: 113 {beats}/min
SDS: 2
SRS: 0
SSS: 2
TID: 1.1

## 2021-01-10 MED ORDER — TECHNETIUM TC 99M TETROFOSMIN IV KIT
10.0000 | PACK | Freq: Once | INTRAVENOUS | Status: AC | PRN
  Administered 2021-01-10: 10 via INTRAVENOUS
  Filled 2021-01-10: qty 10

## 2021-01-10 MED ORDER — TECHNETIUM TC 99M TETROFOSMIN IV KIT
32.3000 | PACK | Freq: Once | INTRAVENOUS | Status: AC | PRN
  Administered 2021-01-10: 32.3 via INTRAVENOUS
  Filled 2021-01-10: qty 33

## 2021-01-10 MED ORDER — REGADENOSON 0.4 MG/5ML IV SOLN
0.4000 mg | Freq: Once | INTRAVENOUS | Status: AC
  Administered 2021-01-10: 0.4 mg via INTRAVENOUS

## 2021-01-13 ENCOUNTER — Telehealth: Payer: Self-pay | Admitting: Pulmonary Disease

## 2021-01-13 ENCOUNTER — Telehealth: Payer: Self-pay

## 2021-01-13 MED ORDER — SHINGRIX 50 MCG/0.5ML IM SUSR: 0.5000 mL | Freq: Once | INTRAMUSCULAR | 1 refills | Status: AC

## 2021-01-13 NOTE — Telephone Encounter (Signed)
Pt is needing a prescription sent in to the [facility] for a shingles shot.

## 2021-01-13 NOTE — Telephone Encounter (Signed)
Rx sent to pharmacy for Shingrix as requested.

## 2021-01-15 ENCOUNTER — Telehealth: Payer: Self-pay | Admitting: Cardiovascular Disease

## 2021-01-15 NOTE — Telephone Encounter (Signed)
Patient's wife is requesting to discuss 01/10/21 stress test results. Please call.

## 2021-01-15 NOTE — Telephone Encounter (Signed)
Encounter opened in error.   Nothing needed at this time.

## 2021-01-15 NOTE — Telephone Encounter (Signed)
Returned call to patients wife(okay per DPR) who called in to get patients recent stress test results.   Made patients wife aware of the following regarding patients stress test results.   Patient's wife verbalized understanding.   Per Dr. Juanita Craver news, your stress test was normal. There were no abnormalities detected. This indicates your heart is functioning normally, and there is no evidence of blockages in your heart arteries.   Please let me know if you have any questions or concerns.   [place] T. Audie Box, MD, [place]

## 2021-01-27 ENCOUNTER — Other Ambulatory Visit: Payer: Self-pay

## 2021-01-27 ENCOUNTER — Encounter: Payer: Self-pay | Admitting: Physician Assistant

## 2021-01-27 ENCOUNTER — Ambulatory Visit: Payer: Medicare Other | Admitting: Physician Assistant

## 2021-01-27 VITALS — BP 110/70 | HR 81 | Temp 97.8°F | Ht 71.0 in | Wt 238.2 lb

## 2021-01-27 DIAGNOSIS — R590 Localized enlarged lymph nodes: Secondary | ICD-10-CM

## 2021-01-27 DIAGNOSIS — U099 Post covid-19 condition, unspecified: Secondary | ICD-10-CM | POA: Diagnosis not present

## 2021-01-27 DIAGNOSIS — I10 Essential (primary) hypertension: Secondary | ICD-10-CM | POA: Diagnosis not present

## 2021-01-27 LAB — COMPREHENSIVE METABOLIC PANEL
ALT: 22 U/L (ref 0–53)
AST: 18 U/L (ref 0–37)
Albumin: 4 g/dL (ref 3.5–5.2)
Alkaline Phosphatase: 56 U/L (ref 39–117)
BUN: 19 mg/dL (ref 6–23)
CO2: 30 mEq/L (ref 19–32)
Calcium: 9.8 mg/dL (ref 8.4–10.5)
Chloride: 99 mEq/L (ref 96–112)
Creatinine, Ser: 0.69 mg/dL (ref 0.40–1.50)
GFR: 88.69 mL/min (ref >60.00)
Glucose, Bld: 101 mg/dL — ABNORMAL HIGH (ref 70–99)
Potassium: 4.5 mEq/L (ref 3.5–5.1)
Sodium: 136 mEq/L (ref 135–145)
Total Bilirubin: 0.6 mg/dL (ref 0.2–1.2)
Total Protein: 6.7 g/dL (ref 6.0–8.3)

## 2021-01-27 LAB — CBC WITH DIFFERENTIAL/PLATELET
Basophils Absolute: 0 10*3/uL (ref 0.0–0.1)
Basophils Relative: 0.5 % (ref 0.0–3.0)
Eosinophils Absolute: 0.3 10*3/uL (ref 0.0–0.7)
Eosinophils Relative: 3.4 % (ref 0.0–5.0)
HCT: 35.6 % — ABNORMAL LOW (ref 39.0–52.0)
Hemoglobin: 11.9 g/dL — ABNORMAL LOW (ref 13.0–17.0)
Lymphocytes Relative: 22.3 % (ref 12.0–46.0)
Lymphs Abs: 1.7 10*3/uL (ref 0.7–4.0)
MCHC: 33.4 g/dL (ref 30.0–36.0)
MCV: 81.1 fl (ref 78.0–100.0)
Monocytes Absolute: 0.6 10*3/uL (ref 0.1–1.0)
Monocytes Relative: 8.5 % (ref 3.0–12.0)
Neutro Abs: 4.8 10*3/uL (ref 1.4–7.7)
Neutrophils Relative %: 65.3 % (ref 43.0–77.0)
Platelets: 270 10*3/uL (ref 150.0–400.0)
RBC: 4.39 Mil/uL (ref 4.22–5.81)
RDW: 15.7 % — ABNORMAL HIGH (ref 11.5–15.5)
WBC: 7.4 10*3/uL (ref 4.0–10.5)

## 2021-01-27 MED ORDER — HYDROCHLOROTHIAZIDE 12.5 MG PO CAPS: 12.5000 mg | ORAL_CAPSULE | Freq: Every day | ORAL | 1 refills | Status: DC

## 2021-01-27 NOTE — Patient Instructions (Signed)
It was great to see you!  Decrease hydrochlorothiazide from 25 mg daily to 12.5 mg daily. I have sent this new script in.  Start PT this week.  You will be contacted about having the CT scan of your chest done.  Follow-up will be based on blood work results.  Take care,  Inda Coke PA-C

## 2021-01-27 NOTE — Progress Notes (Signed)
Mitchell Humphrey is a 80 y.o. male is here for follow up.  I acted as a Education administrator for [employer], PA-C Anselmo Pickler, LPN   History of Present Illness:   Chief Complaint  Patient presents with  . f/u COVID    HPI   COVID-19 long hauler Pt here for f/u. He is doing better says he has been walking, some leg weakness if he walks too long. Denies SOB. Oxygen level is staying around 94-95. Uses O2 at night. Appt with Pulmonary is 3/28.  We reviewed his CT scan also which had the finding of mediastinal adenopathy, likely reactive.  Starting PT on Wednesday for deconditioning. He is eating and drinking well, does admit to drinking coffee and coke zero regularly but little water intake. Feels a little "off balance" at times when starts to sit up to get his day started. Takes BP medication regularly. Recently saw Dr. Marisue Ivan and had normal stress test.  Denies: fevers, chills, LE swelling, chest pain, n/v  Health Maintenance Due  Topic Date Due  . Hepatitis C Screening  Never done  . COVID-19 Vaccine (3 - Booster for Moderna series) 09/23/2020    Past Medical History:  Diagnosis Date  . Allergy   . Arthritis   . BPH (benign prostatic hyperplasia)   . Cataract   . Dyslipidemia   . Erectile dysfunction   . GERD (gastroesophageal reflux disease)   . Hx of adenomatous colonic polyps   . Hyperlipidemia   . Hypertension   . Hypogonadism male   . Hypothyroid   . Obesity      Social History   Tobacco Use  . Smoking status: Never Smoker  . Smokeless tobacco: Never Used  . Tobacco comment: He chews on a cigar  Vaping Use  . Vaping Use: Never used  Substance Use Topics  . Alcohol use: No  . Drug use: No    Past Surgical History:  Procedure Laterality Date  . COLONOSCOPY  08/2008   Dr. Watt Climes  . HERNIA REPAIR  1970   [facility]    Family History  Problem Relation Age of Onset  . Breast cancer Mother   . Lung cancer Father   . Brain cancer Father   . Breast cancer Sister    . Heart attack Sister     PMHx, SurgHx, SocialHx, FamHx, Medications, and Allergies were reviewed in the Visit Navigator and updated as appropriate.   Patient Active Problem List   Diagnosis Date Noted  . Acute hypoxemic respiratory failure due to COVID-19 ([place]) 12/15/2020  . COVID-19 virus infection 12/07/2020  . Insulin resistance 10/23/2020  . Severe obesity (BMI 35.0-35.9 with comorbidity) ([place]) 08/05/2020  . 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 GERD (gastroesophageal reflux disease) 12/20/2018  . Chronic low back pain 05/18/2018  . Hypercholesterolemia 08/09/2017  . BPH (benign prostatic hyperplasia) 08/09/2017  . Osteoarthritis of both hands 08/09/2017  . 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 History of melanoma 02/04/2015  . Elevated prostate specific antigen (PSA) 03/15/2013  . Increased frequency of urination 03/15/2013  . Urge incontinence 03/15/2013  . Benign essential hypertension 05/25/2012  . Hypothyroidism (acquired) 05/25/2012  . Dyslipidemia 05/25/2012  . Hypogonadism male 05/25/2012    Social History   Tobacco Use  . Smoking status: Never Smoker  . Smokeless tobacco: Never Used  . Tobacco comment: He chews on a cigar  Vaping Use  . Vaping Use: Never used  Substance Use Topics  . Alcohol use: No  .  Drug use: No    Current Medications and Allergies:    Current Outpatient Medications:  .  albuterol (VENTOLIN HFA) 108 (90 Base) MCG/ACT inhaler, Inhale 2 puffs into the lungs every 6 (six) hours as needed for wheezing or shortness of breath., Disp: 6.7 g, Rfl: 0 .  apixaban (ELIQUIS) 5 MG TABS tablet, Take 1 tablet (5 mg total) by mouth 2 (two) times daily., Disp: 180 tablet, Rfl: 1 .  atorvastatin (LIPITOR) 20 MG tablet, Take 1 tablet (20 mg total) by mouth daily., Disp: 90 tablet, Rfl: 1 .  benzonatate (TESSALON PERLES) 100 MG capsule, Take 1 capsule (100 mg total) by mouth every 6 (six) hours as needed for cough., Disp: 60 capsule,  Rfl: 1 .  BLACK ELDERBERRY PO, Take 1 capsule by mouth daily., Disp: , Rfl:  .  cetirizine (ZYRTEC) 10 MG tablet, Take 1 tablet (10 mg total) by mouth as needed. (Patient taking differently: Take 10 mg by mouth daily.), Disp: 90 tablet, Rfl: 3 .  Cholecalciferol (VITAMIN D3) 125 MCG (5000 UT) CAPS, Take 1 capsule by mouth daily in the afternoon., Disp: , Rfl:  .  enalapril (VASOTEC) 10 MG tablet, Take 10 mg by mouth daily., Disp: , Rfl:  .  EUTHYROX 150 MCG tablet, TAKE 1 TABLET BY MOUTH ONCE DAILY BEFORE BREAKFAST, Disp: 30 tablet, Rfl: 0 .  famotidine (PEPCID) 20 MG tablet, Take 20 mg by mouth daily., Disp: , Rfl:  .  finasteride (PROSCAR) 5 MG tablet, Take 1 tablet (5 mg total) by mouth daily., Disp: 90 tablet, Rfl: 1 .  fluticasone (FLONASE) 50 MCG/ACT nasal spray, Place 2 sprays into both nostrils daily., Disp: 16 g, Rfl: 2 .  hydrochlorothiazide (MICROZIDE) 12.5 MG capsule, Take 1 capsule (12.5 mg total) by mouth daily., Disp: 90 capsule, Rfl: 1 .  Multiple Vitamin (MULTIVITAMIN) tablet, Take 1 tablet by mouth daily., Disp: , Rfl:  .  MYRBETRIQ 25 MG TB24 tablet, Take 25 mg by mouth daily., Disp: , Rfl:  .  tamsulosin (FLOMAX) 0.4 MG CAPS capsule, Take 0.4 mg by mouth daily., Disp: , Rfl:  .  vitamin A 3 MG (10000 UNITS) capsule, Take 10,000 Units by mouth daily., Disp: , Rfl:  .  vitamin C (ASCORBIC ACID) 500 MG tablet, Take 500 mg by mouth daily., Disp: , Rfl:    Allergies  Allergen Reactions  . Penicillins     Review of Systems   ROS  Negative unless otherwise specified per HPI.  Vitals:   Vitals:   01/27/21 1336  BP: 110/70  Pulse: 81  Temp: 97.8 F (36.6 C)  TempSrc: Temporal  SpO2: 97%  Weight: 238 lb 4 oz (108.1 kg)  Height: 5\' 11"  (1.803 m)     Body mass index is 33.23 kg/m.   Physical Exam:    Physical Exam Vitals and nursing note reviewed.  Constitutional:      General: He is not in acute distress.    Appearance: He is well-developed. He is not  ill-appearing or toxic-appearing.  Cardiovascular:     Rate and Rhythm: Normal rate and regular rhythm.     Pulses: Normal pulses.     Heart sounds: Normal heart sounds, S1 normal and S2 normal.     Comments: No LE edema Pulmonary:     Effort: Pulmonary effort is normal.     Breath sounds: Normal breath sounds.  Skin:    General: Skin is warm and dry.  Neurological:     Mental Status:  He is alert.     GCS: GCS eye subscore is 4. GCS verbal subscore is 5. GCS motor subscore is 6.  Psychiatric:        Speech: Speech normal.        Behavior: Behavior normal. Behavior is cooperative.      Assessment and Plan:    Ples was seen today for f/u covid.  Diagnoses and all orders for this visit:  Benign essential hypertension I think we have room to back off his BP meds a bit. Continue enalapril 10 mg daily. Reduce HCTZ to 12.5 mg daily. Update blood work today and provide recommendations as needed. 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 Plan for PT this week. Has follow-up with pulmonary in two weeks. Will defer to them to further manage night time O2.  Mediastinal adenopathy Weight stable, no red flags. Will defer to pulmonary if follow up CT is needed.  Other orders -     hydrochlorothiazide (MICROZIDE) 12.5 MG capsule; Take 1 capsule (12.5 mg total) by mouth daily.   CMA or LPN served as scribe during this visit. History, Physical, and Plan performed by medical provider. The above documentation has been reviewed and is accurate and complete.   Inda Coke, PA-C [place] 01/27/2021  Follow-up: No follow-ups on file.

## 2021-01-28 ENCOUNTER — Other Ambulatory Visit: Payer: Self-pay | Admitting: Physician Assistant

## 2021-01-28 DIAGNOSIS — R710 Precipitous drop in hematocrit: Secondary | ICD-10-CM

## 2021-01-29 ENCOUNTER — Other Ambulatory Visit: Payer: Self-pay | Admitting: Physician Assistant

## 2021-01-30 ENCOUNTER — Ambulatory Visit (HOSPITAL_COMMUNITY): Payer: Medicare Other

## 2021-01-30 ENCOUNTER — Other Ambulatory Visit: Payer: Self-pay

## 2021-01-30 ENCOUNTER — Encounter (HOSPITAL_COMMUNITY): Payer: Self-pay

## 2021-02-05 ENCOUNTER — Ambulatory Visit: Payer: Medicare Other | Admitting: Internal Medicine

## 2021-02-06 ENCOUNTER — Telehealth: Payer: Self-pay | Admitting: Physician Assistant

## 2021-02-06 ENCOUNTER — Other Ambulatory Visit (INDEPENDENT_AMBULATORY_CARE_PROVIDER_SITE_OTHER): Payer: Medicare Other

## 2021-02-06 DIAGNOSIS — R710 Precipitous drop in hematocrit: Secondary | ICD-10-CM | POA: Diagnosis not present

## 2021-02-06 LAB — FECAL OCCULT BLOOD, IMMUNOCHEMICAL: Fecal Occult Bld: POSITIVE — AB

## 2021-02-06 NOTE — Telephone Encounter (Signed)
CRITICAL VALUE STICKER  CRITICAL VALUE: POS IFOB  RECEIVER (on-site recipient of call):English,Jessica  DATE & TIME NOTIFIED: 02/06/2021 1:47 PM  MESSENGER (representative from lab): Cristal Ford  MD NOTIFIED:  Inda Coke  TIME OF NOTIFICATION: 1:49pm  RESPONSE:

## 2021-02-07 ENCOUNTER — Other Ambulatory Visit: Payer: Self-pay | Admitting: *Deleted

## 2021-02-07 DIAGNOSIS — R195 Other fecal abnormalities: Secondary | ICD-10-CM

## 2021-02-10 ENCOUNTER — Ambulatory Visit (INDEPENDENT_AMBULATORY_CARE_PROVIDER_SITE_OTHER): Payer: Medicare Other

## 2021-02-10 ENCOUNTER — Ambulatory Visit (INDEPENDENT_AMBULATORY_CARE_PROVIDER_SITE_OTHER): Payer: Medicare Other | Admitting: Pulmonary Disease

## 2021-02-10 ENCOUNTER — Other Ambulatory Visit: Payer: Self-pay

## 2021-02-10 ENCOUNTER — Encounter: Payer: Self-pay | Admitting: Pulmonary Disease

## 2021-02-10 DIAGNOSIS — J9601 Acute respiratory failure with hypoxia: Secondary | ICD-10-CM

## 2021-02-10 DIAGNOSIS — U071 COVID-19: Secondary | ICD-10-CM | POA: Diagnosis not present

## 2021-02-10 DIAGNOSIS — I824Z2 Acute embolism and thrombosis of unspecified deep veins of left distal lower extremity: Secondary | ICD-10-CM

## 2021-02-10 DIAGNOSIS — I82409 Acute embolism and thrombosis of unspecified deep veins of unspecified lower extremity: Secondary | ICD-10-CM | POA: Insufficient documentation

## 2021-02-10 DIAGNOSIS — D3501 Benign neoplasm of right adrenal gland: Secondary | ICD-10-CM

## 2021-02-10 NOTE — Assessment & Plan Note (Signed)
He has completed 8 weeks of Eliquis.  He now has blood in his stool This was a provoked DVT due to Covid infection.  We will repeat venous duplex of left leg and if negative, he can discontinue Eliquis

## 2021-02-10 NOTE — Assessment & Plan Note (Signed)
Appears benign. Would recommend 1 year follow-up with CT abdomen and this can be performed by PCP

## 2021-02-10 NOTE — Assessment & Plan Note (Addendum)
Chest x-ray follow-up today but he seems to have recovered well. Chest x-ray was independently reviewed which shows significant improvement in bilateral infiltrates Mediastinal lymphadenopathy seems to be reactive and does not require follow-up

## 2021-02-10 NOTE — Patient Instructions (Signed)
  Ambulatory saturation. Okay to stay off oxygen at night.  We will check nocturnal oximetry and if those do not show any significant desaturations, will discontinue oxygen.  Chest x-ray today  We will schedule Doppler test of legs and if blood clot has resolved you can discontinue blood thinner

## 2021-02-10 NOTE — Progress Notes (Signed)
Subjective:    Patient ID: Mitchell Humphrey, male    DOB: Oct 06, 1941, 80 y.o.   MRN: [id number]  HPI  80 year old never smoker presents after recent Covid pneumonia for follow-up He is vaccinated but not boosted.  He works as a Development worker, community.  He arrives today accompanied by his wife Mitchell Humphrey who corroborates history He was hospitalized 1/32 February 3 for Covid pneumonia, discharged on 3 L oxygen.  Course was complicated by left lower extremity below-knee DVT for which he was placed on Eliquis.  CT imaging also showed mild mediastinal lymphadenopathy and a right adrenal adenoma He now has developed mild anemia, hemoglobin was 11.9 and stool guaiac was positive.  Colonoscopy has been scheduled.  He reports residual muscle weakness and is completing home PT.  He has weaned off oxygen the daytime and continues to use 0.5 L of oxygen during sleep.  He is able to walk about a mile per day.  He still does not have his taste back and reports occasional headaches and a dry cough    Significant tests/ events reviewed  Echo 12/2020 normal LVEF. 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 Consult    Covid end of jan hospitalized for 1 week, still having dry cough, on .25 L o2 at night      Past Medical History:  Diagnosis Date  . Allergy   . Arthritis   . BPH (benign prostatic hyperplasia)   . Cataract   . Dyslipidemia   . Erectile dysfunction   . GERD (gastroesophageal reflux disease)   . Hx of adenomatous colonic polyps   . Hyperlipidemia   . Hypertension   . Hypogonadism male   . Hypothyroid   . Obesity     Past Surgical History:  Procedure Laterality Date  . COLONOSCOPY  08/2008   Dr. Watt Climes  . HERNIA REPAIR  1970   [facility]    Allergies  Allergen Reactions  . Penicillins     Social History    Socioeconomic History  . Marital status: Married    Spouse name: Not on file  . Number of children: 1  . Years of education: Not on file  . Highest education level: Not on file  Occupational History  . Occupation: Scientist, research (medical)  Tobacco Use  . Smoking status: Never Smoker  . Smokeless tobacco: Never Used  . Tobacco comment: He chews on a cigar  Vaping Use  . Vaping Use: Never used  Substance and Sexual Activity  . Alcohol use: No  . Drug use: No  . Sexual activity: Not Currently  Other Topics Concern  . Not on file  Social History Narrative   Superintendent for construction site   Lives with wife   2 step children   2 sons   Social Determinants of Health   Financial Resource Strain: Not on file  Food Insecurity: Not on file  Transportation Needs: Not on file  Physical Activity: Not on file  Stress: Not on file  Social Connections: Not on file  Intimate Partner Violence: Not on file    Family History  Problem Relation Age of Onset  . Breast cancer Mother   . Lung cancer Father   . Brain cancer Father   . Breast cancer Sister   . Heart attack Sister      Review of Systems  Constitutional: negative  for anorexia, fevers and sweats  Eyes: negative for irritation, redness and visual disturbance  Ears, nose, mouth, throat, and face: negative for earaches, epistaxis, nasal congestion and sore throat  Respiratory: negative for  sputum and wheezing  Cardiovascular: negative for chest pain, dyspnea, lower extremity edema, orthopnea, palpitations and syncope  Gastrointestinal: negative for abdominal pain, constipation, diarrhea, melena, nausea and vomiting  Genitourinary:negative for dysuria, frequency and hematuria  Hematologic/lymphatic: negative for bleeding, easy bruising and lymphadenopathy  Musculoskeletal:negative for arthralgias,  and stiff joints  Neurological: negative for coordination problems, gait problems, headaches and weakness  Endocrine:  negative for diabetic symptoms including polydipsia, polyuria and weight loss     Objective:   Physical Exam  Gen. Pleasant, elderlywell-nourished, in no distress, normal affect ENT - no pallor,icterus, no post nasal drip Neck: No JVD, no thyromegaly, no carotid bruits Lungs: no use of accessory muscles, no dullness to percussion, clear without rales or rhonchi  Cardiovascular: Rhythm regular, heart sounds  normal, no murmurs or gallops, no peripheral edema Abdomen: soft and non-tender, no hepatosplenomegaly, BS normal. Musculoskeletal: No deformities, no cyanosis or clubbing Neuro:  alert, non focal        Assessment & Plan:

## 2021-02-10 NOTE — Assessment & Plan Note (Addendum)
He seems to have recovered well and does not seem to require oxygen anymore Ambulatory saturation did not show any evidence of desaturation Okay to stay off oxygen at night.  We will check nocturnal oximetry and if those do not show any significant desaturations, will discontinue oxygen.

## 2021-02-11 ENCOUNTER — Telehealth: Payer: Self-pay

## 2021-02-11 NOTE — Telephone Encounter (Signed)
Mitchell Humphrey, please see message from pt.

## 2021-02-11 NOTE — Telephone Encounter (Signed)
Pt would like to be referred to Dr. Clarene Essex at [facility].

## 2021-02-12 ENCOUNTER — Telehealth: Payer: Self-pay

## 2021-02-12 NOTE — Telephone Encounter (Addendum)
-----   Message from Rigoberto Noel, MD sent at 02/11/2021 10:21 AM EDT ----- Infiltrates due to Covid pneumonia markedly improved. Only mild bibasal scarring remains  Called and spoke with patient about CXR results, patient expressed understanding of results. Nothing further needed.

## 2021-02-13 ENCOUNTER — Ambulatory Visit (HOSPITAL_COMMUNITY)
Admission: RE | Admit: 2021-02-13 | Discharge: 2021-02-13 | Disposition: A | Discharge status: Home / Self Care | Payer: Medicare Other | Source: Ambulatory Visit | Attending: Pulmonary Disease | Admitting: Pulmonary Disease

## 2021-02-13 ENCOUNTER — Other Ambulatory Visit: Payer: Self-pay

## 2021-02-13 DIAGNOSIS — I824Z2 Acute embolism and thrombosis of unspecified deep veins of left distal lower extremity: Secondary | ICD-10-CM | POA: Insufficient documentation

## 2021-02-13 NOTE — Progress Notes (Signed)
Left lower extremity venous study completed.    Please see CV Proc for preliminary results.   Vonzell Schlatter, RVT

## 2021-02-14 ENCOUNTER — Telehealth: Payer: Self-pay

## 2021-02-14 DIAGNOSIS — J9601 Acute respiratory failure with hypoxia: Secondary | ICD-10-CM

## 2021-02-14 DIAGNOSIS — U071 COVID-19: Secondary | ICD-10-CM

## 2021-02-14 NOTE — Telephone Encounter (Signed)
I spoke to the patients wife today and they have decided to keep his appointment at [facility] which is on 03/03/21-just and FYI

## 2021-02-14 NOTE — Progress Notes (Signed)
Called and went over [place] results and recommendations per Dr Elsworth Soho with patient's wife [name], per [facility].  All questions answered and wife expressed full understanding. Nothing further needed at this time.

## 2021-02-14 NOTE — Telephone Encounter (Signed)
-----   Message from Rigoberto Noel, MD sent at 02/14/2021 11:54 AM EDT ----- Blood clot has resolved. He can stop taking Eliquis

## 2021-02-14 NOTE — Telephone Encounter (Signed)
Patient's wife, Mitchell Humphrey per DPR called and had received the [place] results and was wondering if patient could stop using the O2. Per Mitchell Humphrey, patient has been using O2 at night only and wife states that Dr Elsworth Soho had mentioned that he may be able to go off O2 if the [place] results came back ok.  Dr Elsworth Soho please advise.

## 2021-02-14 NOTE — Telephone Encounter (Signed)
Plan was to check ONO on RA - please ensure this was ordered OK to stay off O2 meantime

## 2021-02-14 NOTE — Progress Notes (Signed)
Called and left message on voicemail to please return phone call to go over [place] results. Contact number provided.

## 2021-02-17 NOTE — Telephone Encounter (Signed)
Called and spoke with patient's wife, [name] per [facility] and went over Dr Bari Mantis recommendations. Wife expressed full understanding. [name] stated the ONO was done on 02/13/21. Will route to Dr Elsworth Soho as Juluis Rainier. Nothing further needed at this time.

## 2021-02-17 NOTE — Telephone Encounter (Signed)
Noted  

## 2021-02-18 NOTE — Telephone Encounter (Signed)
Please obtain ONO results

## 2021-02-18 NOTE — Telephone Encounter (Signed)
Called and spoke with [facility] who stated they will fax the ONO results to the [place] office with attention Dr Elsworth Soho.

## 2021-02-21 NOTE — Telephone Encounter (Signed)
I called and spoke to [name], patient's wife who is on DPR regarding ONO results and Dr. Elsworth Soho recs. She verbalized understanding and stated patient wanted to stop using oxygen so I went ahead and sent in an order to [facility] to D/C oxygen per Dr. Constance Goltz. Nothing further was needed.

## 2021-02-21 NOTE — Telephone Encounter (Signed)
ONO 02/12/21 showed desaturation for 37 minutes less than 88%. This is mild oxygen desaturation and would qualify for him to continue oxygen but if he feels otherwise improved, okay to discontinue

## 2021-02-21 NOTE — Addendum Note (Signed)
Addended by: Coralie Keens on: 02/21/2021 12:31 PM   Modules accepted: Orders

## 2021-02-26 ENCOUNTER — Other Ambulatory Visit: Payer: Self-pay | Admitting: Physician Assistant

## 2021-03-03 ENCOUNTER — Ambulatory Visit: Payer: Medicare Other | Admitting: Nurse Practitioner

## 2021-03-13 ENCOUNTER — Other Ambulatory Visit (HOSPITAL_COMMUNITY): Payer: Self-pay

## 2021-05-28 ENCOUNTER — Other Ambulatory Visit: Payer: Self-pay | Admitting: Physician Assistant

## 2021-06-02 ENCOUNTER — Other Ambulatory Visit: Payer: Self-pay

## 2021-06-02 ENCOUNTER — Encounter: Payer: Self-pay | Admitting: Family Medicine

## 2021-06-02 ENCOUNTER — Ambulatory Visit (INDEPENDENT_AMBULATORY_CARE_PROVIDER_SITE_OTHER): Payer: Medicare (Managed Care) | Admitting: Family Medicine

## 2021-06-02 VITALS — BP 144/77 | HR 83 | Temp 98.2°F | Ht 70.0 in | Wt 240.2 lb

## 2021-06-02 DIAGNOSIS — M159 Polyosteoarthritis, unspecified: Secondary | ICD-10-CM

## 2021-06-02 DIAGNOSIS — J329 Chronic sinusitis, unspecified: Secondary | ICD-10-CM | POA: Diagnosis not present

## 2021-06-02 DIAGNOSIS — I10 Essential (primary) hypertension: Secondary | ICD-10-CM

## 2021-06-02 MED ORDER — DOXYCYCLINE HYCLATE 100 MG PO TABS: 100.0000 mg | ORAL_TABLET | Freq: Two times a day (BID) | ORAL | 0 refills | Status: DC

## 2021-06-02 MED ORDER — PREDNISONE 50 MG PO TABS: ORAL_TABLET | ORAL | 0 refills | Status: DC

## 2021-06-02 NOTE — Patient Instructions (Signed)
It was very nice to see you today!  I think you have a sinus infection.  Please start the doxycycline and prednisone.  Let us know if not improving by next week.  Take care, Dr Jerline Pain  PLEASE NOTE:  If you had any lab tests please let us know if you have not heard back within a few days. You may see your results on mychart before we have a chance to review them but we will give you a call once they are reviewed by [place]. If we ordered any referrals today, please let us know if you have not heard from their office within the next week.   Please try these tips to maintain a healthy lifestyle:  Eat at least 3 REAL meals and 1-2 snacks per day.  Aim for no more than 5 hours between eating.  If you eat breakfast, please do so within one hour of getting up.   Each meal should contain half fruits/vegetables, one quarter protein, and one quarter carbs (no bigger than a computer mouse)  Cut down on sweet beverages. This includes juice, soda, and sweet tea.   Drink at least 1 glass of water with each meal and aim for at least 8 glasses per day  Exercise at least 150 minutes every week.

## 2021-06-02 NOTE — Progress Notes (Signed)
   Mitchell Humphrey is a 80 y.o. male who presents today for an office visit.  Assessment/Plan:  New/Acute Problems: Sinusitis No red flags.  Given chronicity of symptoms we will start doxycycline and prednisone.  Has penicillin allergy.  Encourage good oral hydration.  Chronic Problems Addressed Today: Benign essential hypertension At goal per JNC 8 on HCTZ 12.5 mg daily.  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  This should improve with prednisone.  Would avoid NSAIDs due to him being anticoagulated on Eliquis.     Subjective:  HPI: CC of the patient is headache. He is also experiencing congestion in his sinuses and states that when he blow his nose his ears pop. Additionally, his left wrist has worsening arthritis; the pain can get bad enough that it wakes him up at night.  The arthritis is significantly limiting the range of movement of his left wrist.  Left wrist is tender; palpation produces pain. Since he had had COVID a few months ago, he had been noticing dizziness while walking. He denies SOB and vertigo. He notes that when he previously had a sinus infection he experienced a similar dizziness. Also, he notes having sleep issues, as he lays in bed 2-4 hours before being able to sleep.         Objective:  Physical Exam: BP (!) 144/77   Pulse 83   Temp 98.2 F (36.8 C) (Temporal)   Ht 5\' 10"  (1.778 m)   Wt 240 lb 3.2 oz (109 kg)   SpO2 96%   BMI 34.47 kg/m   Gen: No acute distress, resting comfortably HEENT: Left TM clear. Right TM erythematous. Nasal mucosa erythematous and boggy. OP erythematous.  CV: Regular rate and rhythm with murmur appreciated Pulm: Normal work of breathing, clear to auscultation bilaterally with no crackles, wheezes, or rhonchi MSK: Left wrist with bony hypertrophy noted.  Tenderness to palpation.  Limited range of motion Neuro: Grossly normal, moves all extremities Psych: Normal affect and thought content      I,Jordan  Kelly,acting as a scribe for Dimas Chyle, MD.,have documented all relevant documentation on the behalf of Dimas Chyle, MD,as directed by  Dimas Chyle, MD while in the presence of Dimas Chyle, MD.  I, Dimas Chyle, MD, have reviewed all documentation for this visit. The documentation on 06/02/21 for the exam, diagnosis, procedures, and orders are all accurate and complete.  Algis Greenhouse. Jerline Pain, MD 06/02/2021 1:19 PM

## 2021-06-02 NOTE — Assessment & Plan Note (Signed)
At goal per JNC 8 on HCTZ 12.5 mg daily.

## 2021-06-02 NOTE — Assessment & Plan Note (Signed)
Mild flare in left wrist.  This should improve with prednisone.  Would avoid NSAIDs due to him being anticoagulated on Eliquis.

## 2021-06-12 ENCOUNTER — Other Ambulatory Visit: Payer: Self-pay | Admitting: Physician Assistant

## 2021-07-01 ENCOUNTER — Other Ambulatory Visit: Payer: Self-pay | Admitting: Family Medicine

## 2021-07-08 ENCOUNTER — Other Ambulatory Visit: Payer: Self-pay | Admitting: Physician Assistant

## 2021-07-14 ENCOUNTER — Other Ambulatory Visit: Payer: Self-pay | Admitting: Physician Assistant

## 2021-07-14 ENCOUNTER — Encounter: Payer: Self-pay | Admitting: Family Medicine

## 2021-07-14 ENCOUNTER — Ambulatory Visit (INDEPENDENT_AMBULATORY_CARE_PROVIDER_SITE_OTHER): Payer: Medicare (Managed Care) | Admitting: Family Medicine

## 2021-07-14 ENCOUNTER — Other Ambulatory Visit: Payer: Self-pay

## 2021-07-14 VITALS — BP 142/74 | HR 65 | Temp 98.1°F | Ht 70.0 in | Wt 246.0 lb

## 2021-07-14 DIAGNOSIS — E039 Hypothyroidism, unspecified: Secondary | ICD-10-CM

## 2021-07-14 DIAGNOSIS — R0981 Nasal congestion: Secondary | ICD-10-CM | POA: Diagnosis not present

## 2021-07-14 DIAGNOSIS — I10 Essential (primary) hypertension: Secondary | ICD-10-CM | POA: Diagnosis not present

## 2021-07-14 DIAGNOSIS — R197 Diarrhea, unspecified: Secondary | ICD-10-CM | POA: Diagnosis not present

## 2021-07-14 LAB — COMPREHENSIVE METABOLIC PANEL
ALT: 18 U/L (ref 0–53)
AST: 18 U/L (ref 0–37)
Albumin: 4.2 g/dL (ref 3.5–5.2)
Alkaline Phosphatase: 44 U/L (ref 39–117)
BUN: 17 mg/dL (ref 6–23)
CO2: 27 mEq/L (ref 19–32)
Calcium: 9.5 mg/dL (ref 8.4–10.5)
Chloride: 105 mEq/L (ref 96–112)
Creatinine, Ser: 0.82 mg/dL (ref 0.40–1.50)
GFR: 83.91 mL/min (ref >60.00)
Glucose, Bld: 99 mg/dL (ref 70–99)
Potassium: 4.3 mEq/L (ref 3.5–5.1)
Sodium: 140 mEq/L (ref 135–145)
Total Bilirubin: 0.6 mg/dL (ref 0.2–1.2)
Total Protein: 6.3 g/dL (ref 6.0–8.3)

## 2021-07-14 LAB — CBC WITH DIFFERENTIAL/PLATELET
Basophils Absolute: 0 10*3/uL (ref 0.0–0.1)
Basophils Relative: 0.4 % (ref 0.0–3.0)
Eosinophils Absolute: 0.2 10*3/uL (ref 0.0–0.7)
Eosinophils Relative: 2.8 % (ref 0.0–5.0)
HCT: 38.5 % — ABNORMAL LOW (ref 39.0–52.0)
Hemoglobin: 12.6 g/dL — ABNORMAL LOW (ref 13.0–17.0)
Lymphocytes Relative: 20.4 % (ref 12.0–46.0)
Lymphs Abs: 1.4 10*3/uL (ref 0.7–4.0)
MCHC: 32.8 g/dL (ref 30.0–36.0)
MCV: 82.6 fl (ref 78.0–100.0)
Monocytes Absolute: 0.6 10*3/uL (ref 0.1–1.0)
Monocytes Relative: 8.9 % (ref 3.0–12.0)
Neutro Abs: 4.6 10*3/uL (ref 1.4–7.7)
Neutrophils Relative %: 67.5 % (ref 43.0–77.0)
Platelets: 239 10*3/uL (ref 150.0–400.0)
RBC: 4.66 Mil/uL (ref 4.22–5.81)
RDW: 15.6 % — ABNORMAL HIGH (ref 11.5–15.5)
WBC: 6.8 10*3/uL (ref 4.0–10.5)

## 2021-07-14 LAB — LIPID PANEL
Cholesterol: 135 mg/dL (ref 0–200)
HDL: 37.3 mg/dL — ABNORMAL LOW (ref >39.00)
LDL Cholesterol: 61 mg/dL (ref 0–99)
NonHDL: 97.39
Total CHOL/HDL Ratio: 4
Triglycerides: 181 mg/dL — ABNORMAL HIGH (ref 0.0–149.0)
VLDL: 36.2 mg/dL (ref 0.0–40.0)

## 2021-07-14 LAB — TSH: TSH: 3.28 u[IU]/mL (ref 0.35–5.50)

## 2021-07-14 NOTE — Patient Instructions (Addendum)
Team make sure he gets a stool collection kit for GI pathogen panel from the lab before he goes  Please stop by lab before you go If you have mychart- we will send your results within 3 business days of [place] receiving them.  If you do not have mychart- we will call you about results within 5 business days of [place] receiving them.  *please also note that you will see labs on mychart as soon as they post. I will later go in and write notes on them- will say "notes from Dr. Yong Channel"  Team please test for COVID-19 under diarrhea and sinus congestion.  With recurrent worsening sinus pressure and congestion-I would like to start an extended course of antibiotics but I want to hold off until we evaluate what is causing your loose stool/diarrhea.  If we do not find a copy as cause on GI pathogen panel may try probiotics-possible the prior doxycycline upset your GI balance-with that being said I might do that after we treat with antibiotics so we do not end up in the same situation  Recommended follow up: After you start feeling better, let's reschedule a follow-up visit to check blood pressure with Inda Coke, PA.

## 2021-07-14 NOTE — Progress Notes (Signed)
Phone [phone] In person visit   Subjective:   Mitchell Humphrey is a 80 y.o. year old very pleasant male patient who presents for/with See problem oriented charting Chief Complaint  Patient presents with   Diarrhea    1 week, Patient has taken Pepto bismol     This visit occurred during the SARS-CoV-2 public health emergency.  Safety protocols were in place, including screening questions prior to the visit, additional usage of staff PPE, and extensive cleaning of exam room while observing appropriate contact time as indicated for disinfecting solutions.   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   GERD (gastroesophageal reflux disease) 12/20/2018   Chronic low back pain 05/18/2018   Hypercholesterolemia 08/09/2017   BPH (benign prostatic hyperplasia) 08/09/2017   Osteoarthritis of both hands 08/09/2017   Osteoarthritis, 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   History of melanoma 02/04/2015   Elevated prostate specific antigen (PSA) 03/15/2013   Increased frequency of urination 03/15/2013   Urge incontinence 03/15/2013   Benign essential hypertension 05/25/2012   Hypothyroidism (acquired) 05/25/2012   Dyslipidemia 05/25/2012   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 Swallow whole.     atorvastatin (LIPITOR) 20 MG tablet Take 1 tablet by mouth once daily 90 tablet 0   Cholecalciferol (VITAMIN D3) 125 MCG (5000 UT) CAPS Take 1 capsule by mouth daily in the afternoon.     Coenzyme Q10 (CO Q-10) 400 MG CAPS Take by mouth.     doxycycline (VIBRA-TABS)  100 MG tablet Take 1 tablet (100 mg total) by mouth 2 (two) times daily. 20 tablet 0   enalapril (VASOTEC) 10 MG tablet Take 10 mg by mouth daily.     EUTHYROX 150 MCG tablet TAKE 1 TABLET BY MOUTH ONCE DAILY BEFORE BREAKFAST ** NEED TO MAKE AN APPOINTMENT** 30 tablet 1   famotidine (PEPCID) 20 MG tablet Take 20 mg by mouth daily.     finasteride (PROSCAR) 5 MG tablet Take 1 tablet by mouth once daily 90 tablet 0   fluticasone (FLONASE) 50 MCG/ACT nasal spray Place 2 sprays into both nostrils daily. 16 g 2   hydrochlorothiazide (MICROZIDE) 12.5 MG capsule Take 1 capsule (12.5 mg total) by mouth daily. 90 capsule 1   meloxicam (MOBIC) 15 MG tablet Take 15 mg by mouth daily.     Multiple Vitamin (MULTIVITAMIN) tablet Take 1 tablet by mouth daily.     MYRBETRIQ 25 MG TB24 tablet Take 25 mg by mouth daily.     OVER THE COUNTER MEDICATION Urinozinc Prostate Plus     OVER THE COUNTER MEDICATION CENTRUM SILVER     OVER THE COUNTER MEDICATION 400 mg daily. Mucus Relief     OVER THE COUNTER MEDICATION 1,000 mg. GRACINIA     OVER THE COUNTER MEDICATION Vita Fruits + Veggies     OVER THE COUNTER MEDICATION NITR Immune Protect     Saw Palmetto 1000 MG CAPS Take by mouth. Take 2 capsules in the MORNING.     tamsulosin (FLOMAX) 0.4 MG CAPS capsule Take 0.4 mg by mouth daily.     Turmeric (  QC TUMERIC COMPLEX PO) Take 1,500 mg by mouth.     vitamin A 3 MG (10000 UNITS) capsule Take 10,000 Units by mouth daily.     vitamin C (ASCORBIC ACID) 500 MG tablet Take 500 mg by mouth daily.     No current facility-administered medications for this visit.     Objective:  BP (!) 142/74   Pulse 65   Temp 98.1 F (36.7 C) (Temporal)   Ht '5\' 10"'$  (1.778 m)   Wt 246 lb (111.6 kg)   SpO2 97%   BMI 35.30 kg/m  Gen: NAD, resting comfortably CV: RRR no rubs or gallops. Murmur noted- follows with cardiology - upcoming echo planned  Lungs: CTAB no crackles, wheeze, rhonchi Abdomen: soft/nontender/nondistended/normal  bowel sounds. No rebound or guarding.  Ext: no edema Skin: warm, dry    Assessment and Plan   # Diarrhea  S: Patient complains of at least 10 days of diarrhea.  He has tried Pepto-Bismol without relief. Some variability in how much diarrhe he has- up to 5x somedays, in last 24 hours about 5x- so at his peak recently. Seems to be worsening since last Thursday. No mucus in stool. No brbpr and no fever/chills.  Does get some abdominal cramping before bowel movements that seems to resolve after bowel movement  -COVID-19 testing- not at home. Did had covid in February of this year- was on oxygen until may  -Patient was treated with doxycycline for sinusitis in late July but denies any other recent antibiotics.  Symptoms improved 75% but never went away completely then got worse again in last few weeks- a lot of sinus pressure and congestion. 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 Does do some coffee and coke zero BP Readings from Last 3 Encounters:  07/14/21 (!) 142/74  06/02/21 (!) 144/77  02/10/21 128/84  A/P: mild poor contorl of blood pressure but not feeling well- encouraged him to continue current meds and when feeling better to schedule follow up with PCP  #hypothyroidism S: compliant On thyroid medication-Euthyrox 150 mcg  daily  Lab Results  Component Value Date   TSH 0.681 12/16/2020   A/P: in case thyroid contributing to diarrhea issues- check TSH today-for now continue current medication -will check lipids as well with last check 2013- on atorvastatin daily Lab Results  Component Value Date   CHOL 234 (H) 05/25/2012   HDL 41 05/25/2012   LDLCALC 152 (H) 05/25/2012   TRIG 121 12/15/2020   CHOLHDL 5.7 05/25/2012   # history of DVT- off eliquis now- no blood in the stool noted or melena- does take aspirin   Recommended follow up: Return for  sooner if needed if new or worsening symptoms have improved..  No orders of the defined types were placed in this encounter.  I,Jada Bradford,acting as a Education administrator for  Garret Reddish, MD.,have documented all relevant documentation on the behalf of Garret Reddish, MD,as directed by  Garret Reddish, MD while in the presence of Garret Reddish, MD.   I, Garret Reddish, MD, have reviewed all documentation for this visit. The documentation on 07/14/21 for the exam, diagnosis, procedures, and orders are all accurate and complete.   Return precautions advised. Garret Reddish, MD

## 2021-07-14 NOTE — Addendum Note (Signed)
Addended by: Linton Ham on: 07/14/2021 11:30 AM   Modules accepted: Orders

## 2021-07-15 LAB — SARS-COV-2, NAA 2 DAY TAT

## 2021-07-15 LAB — NOVEL CORONAVIRUS, NAA: SARS-CoV-2, NAA: NOT DETECTED

## 2021-07-16 ENCOUNTER — Telehealth: Payer: Self-pay

## 2021-07-16 LAB — GASTROINTESTINAL PATHOGEN PANEL PCR
C. difficile Tox A/B, PCR: NOT DETECTED
Campylobacter, PCR: NOT DETECTED
Cryptosporidium, PCR: DETECTED — AB
E coli (ETEC) LT/ST PCR: NOT DETECTED
E coli (STEC) stx1/stx2, PCR: NOT DETECTED
E coli 0157, PCR: NOT DETECTED
Giardia lamblia, PCR: NOT DETECTED
Norovirus, PCR: NOT DETECTED
Rotavirus A, PCR: NOT DETECTED
Salmonella, PCR: NOT DETECTED
Shigella, PCR: NOT DETECTED

## 2021-07-16 NOTE — Telephone Encounter (Signed)
I called and spoke with pt wife and results were given.

## 2021-07-16 NOTE — Telephone Encounter (Signed)
Patient is calling in stating he saw Dr.Hunter on Monday and hasnt heard from anyone about lab results. Wanting to know if someone can give him a call back.

## 2021-07-17 ENCOUNTER — Other Ambulatory Visit: Payer: Self-pay | Admitting: Family Medicine

## 2021-07-17 MED ORDER — DOXYCYCLINE HYCLATE 100 MG PO TABS: 100.0000 mg | ORAL_TABLET | Freq: Two times a day (BID) | ORAL | 0 refills | Status: DC

## 2021-07-22 ENCOUNTER — Other Ambulatory Visit: Payer: Self-pay | Admitting: Physician Assistant

## 2021-07-30 ENCOUNTER — Telehealth: Payer: Self-pay

## 2021-07-30 NOTE — Telephone Encounter (Signed)
LAST APPOINTMENT DATE:  07/14/21  NEXT APPOINTMENT DATE: None  MEDICATION:enalapril (VASOTEC) 10 MG tablet  hydrochlorothiazide (MICROZIDE) 12.5 MG capsule  PHARMACY:[facility] [address], [place]

## 2021-07-31 MED ORDER — ENALAPRIL MALEATE 10 MG PO TABS: 10.0000 mg | ORAL_TABLET | Freq: Every day | ORAL | 1 refills | Status: DC

## 2021-07-31 MED ORDER — HYDROCHLOROTHIAZIDE 12.5 MG PO CAPS
12.5000 mg | ORAL_CAPSULE | Freq: Every day | ORAL | 1 refills | Status: DC
Start: 2021-07-31 — End: 2021-12-15

## 2021-07-31 NOTE — Telephone Encounter (Signed)
Left detailed message on Mitchell Humphrey's voicemail Rx's sent to pharmacy as requested for pt.

## 2021-08-11 ENCOUNTER — Other Ambulatory Visit: Payer: Self-pay | Admitting: Physician Assistant

## 2021-08-25 ENCOUNTER — Other Ambulatory Visit: Payer: Self-pay | Admitting: Family Medicine

## 2021-08-28 ENCOUNTER — Ambulatory Visit: Payer: Medicare (Managed Care)

## 2021-09-01 ENCOUNTER — Other Ambulatory Visit: Payer: Self-pay | Admitting: Physician Assistant

## 2021-09-02 ENCOUNTER — Ambulatory Visit: Payer: Medicare (Managed Care)

## 2021-09-04 ENCOUNTER — Ambulatory Visit (INDEPENDENT_AMBULATORY_CARE_PROVIDER_SITE_OTHER): Payer: Medicare (Managed Care)

## 2021-09-04 ENCOUNTER — Other Ambulatory Visit: Payer: Self-pay

## 2021-09-04 DIAGNOSIS — Z23 Encounter for immunization: Secondary | ICD-10-CM | POA: Diagnosis not present

## 2021-09-21 ENCOUNTER — Other Ambulatory Visit: Payer: Self-pay | Admitting: Physician Assistant

## 2021-09-22 NOTE — Progress Notes (Signed)
Subjective:    Mitchell Humphrey is a 80 y.o. male and is here for a comprehensive physical exam.   HPI  Health Maintenance Due  Topic Date Due   Hepatitis C Screening  Never done   COVID-19 Vaccine (3 - Booster for Moderna series) 05/18/2020   Zoster Vaccines- Shingrix (2 of 2) 03/12/2021    Acute Concerns: L wrist pain/OA Mitchell Humphrey states he has been having pain in his left wrist at night. He says he feels as though he is losing the ability to grip items. At this time he has used a compression brace that he wears at night as well as voltaren gel which has provided temporary relief. Currently compliant with taking meloxicam 15 mg daily with no adverse effects. Denies recent injury.   Chronic Issues: BPH Currently compliant with flomax 0.4 mg and proscar 5 mg with no adverse effects. Reports there haven't been any new sx beside urinary frequency at night. He is managing well.   Hypothyroidism  Currently compliant with taking synthroid 150 mg with no adverse effects. Denies hair loss, brittle nails, heat/cold intolerance, or fatigue.   HLD Currently compliant with taking lipitor 20 mg with no adverse effects.   HTN Currently compliant with taking microzide 12.5 mg and vasotec 10 mg daily with no adverse effects. Patient denies chest pain, SOB, blurred vision, dizziness, unusual headaches, lower leg swelling. Denies excessive caffeine intake, stimulant usage, excessive alcohol intake, or increase in salt consumption.  BP Readings from Last 3 Encounters:  09/23/21 140/80  07/14/21 (!) 142/74  06/02/21 (!) 144/77    Health Maintenance: Immunizations -- COVID- Last completed 03/23/20 (Moderna- 2 doses) Influenza Vaccine- Last completed 09/04/21 Tdap- Last completed 05/25/12 Colonoscopy -- Reported last completed 06/22/18 PSA --  Lab Results  Component Value Date   PSA 1.10 01/06/2021   PSA 7.01 (H) 05/25/2012   PSA 6.18 (H) 05/25/2012   Diet -- Doesn't believe diet has been as  improved as he would like Sleep habits -- Normal Exercise -- Still walks regularly  Weight -- Up 4 pounds but stable Recent weight history Wt Readings from Last 10 Encounters:  09/23/21 250 lb 6.1 oz (113.6 kg)  07/14/21 246 lb (111.6 kg)  06/02/21 240 lb 3.2 oz (109 kg)  02/10/21 241 lb (109.3 kg)  01/27/21 238 lb 4 oz (108.1 kg)  01/10/21 236 lb (107 kg)  01/07/21 236 lb 9.6 oz (107.3 kg)  01/06/21 238 lb (108 kg)  12/26/20 238 lb 6.1 oz (108.1 kg)  12/23/20 243 lb 4 oz (110.3 kg)   Body mass index is 37.52 kg/m. Mood -- Stable Alcohol use --  reports no history of alcohol use.  Tobacco use --  Tobacco Use: Low Risk    Smoking Tobacco Use: Never   Smokeless Tobacco Use: Never   Passive Exposure: Not on file     Depression screen [facility] 2/9 06/02/2021  Decreased Interest 0  Down, Depressed, Hopeless 0  PHQ - 2 Score 0    Other providers/specialists: Patient Care Team: Inda Coke, [place] as PCP - General (Physician Assistant) Bernerd Limbo, MD as Referring Physician (Family Medicine)    PMHx, SurgHx, SocialHx, Medications, and Allergies were reviewed in the Visit Navigator and updated as appropriate.   Past Medical History:  Diagnosis Date   Allergy    Arthritis    BPH (benign prostatic hyperplasia)    Cataract    Dyslipidemia    Erectile dysfunction    GERD (gastroesophageal reflux  disease)    Hx of adenomatous colonic polyps    Hyperlipidemia    Hypertension    Hypogonadism male    Hypothyroid    Obesity      Past Surgical History:  Procedure Laterality Date   COLONOSCOPY  08/2008   Dr. Watt Climes   HERNIA REPAIR  1970   [facility]     Family History  Problem Relation Age of Onset   Breast cancer Mother    Lung cancer Father    Brain cancer Father    Breast cancer Sister    Heart attack Sister     Social History   Tobacco Use   Smoking status: Never   Smokeless tobacco: Never   Tobacco comments:    He chews on a cigar  Vaping Use   Vaping  Use: Never used  Substance Use Topics   Alcohol use: No   Drug use: No    Review of Systems:   Review of Systems  Constitutional:  Negative for chills, fever, malaise/fatigue and weight loss.  HENT:  Negative for hearing loss, sinus pain and sore throat.   Respiratory:  Negative for cough and hemoptysis.   Cardiovascular:  Negative for chest pain, palpitations, leg swelling and PND.  Gastrointestinal:  Negative for abdominal pain, constipation, diarrhea, heartburn, nausea and vomiting.  Genitourinary:  Negative for dysuria, frequency and urgency.  Musculoskeletal:  Negative for back pain, myalgias and neck pain.  Skin:  Negative for itching and rash.  Neurological:  Negative for dizziness, tingling, seizures and headaches.  Endo/Heme/Allergies:  Negative for polydipsia.  Psychiatric/Behavioral:  Negative for depression. The patient is not nervous/anxious.     Objective:    Vitals:   09/23/21 1021 09/23/21 1048  BP: (!) 150/80 140/80  Pulse: 69   Temp: 98.1 F (36.7 C)   SpO2: 95%    Body mass index is 37.52 kg/m.  General  Alert, cooperative, no distress, appears stated age  Head:  Normocephalic, without obvious abnormality, atraumatic  Eyes:  PERRL, conjunctiva/corneas clear, EOM's intact, fundi benign, both eyes       Ears:  Normal TM's and external ear canals, both ears  Nose: Nares normal, septum midline, mucosa normal, no drainage or sinus tenderness  Throat: Lips, mucosa, and tongue normal; teeth and gums normal  Neck: Supple, symmetrical, trachea midline, no adenopathy;     thyroid:  No enlargement/tenderness/nodules; no carotid bruit or JVD  Back:   Symmetric, no curvature, ROM normal, no CVA tenderness  Lungs:   Clear to auscultation bilaterally, respirations unlabored  Chest wall:  No tenderness or deformity  Heart:  Regular rate and rhythm, S1 and S2 normal, no murmur, rub or gallop  Abdomen:   Soft, non-tender, bowel sounds active all four quadrants, no  masses, no organomegaly  Extremities: Extremities normal, atraumatic, no cyanosis or edema  Prostate : Deferred   Skin: Skin color, texture, turgor normal, no rashes or lesions  Lymph nodes: Cervical, supraclavicular, and axillary nodes normal  Neurologic: CNII-XII grossly intact. Normal strength, sensation and reflexes throughout   AssessmentPlan:   Encounter for general adult medical examination with abnormal findings Today patient counseled on age appropriate routine health concerns for screening and prevention, each reviewed and up to date or declined. Immunizations reviewed and up to date or declined. Labs ordered and reviewed. Risk factors for depression reviewed and negative. Hearing function and visual acuity are intact. ADLs screened and addressed as needed. Functional ability and level of safety reviewed and appropriate. Education,  counseling and referrals performed based on assessed risks today. Patient provided with a copy of personalized plan for preventive services.  Benign prostatic hyperplasia with lower urinary tract symptoms, symptom details unspecified Stable per patient Continue flomax 0.4 mg daily and proscar 5 mg daily Check PSA today per patient request - low threshold to refer to urology if indicated  Left wrist pain; Osteoarthritis, generalized Uncontrolled Will obtain imaging for further evaluation Continue compression, voltaren and mobic Consider referral to sports medicine based on xray results  Primary hypertension At goal on recheck Continue enalapril 10 mg daily and HCTZ 12.5 mg daily Follow-up in 6 months, sooner if concerns  Hypothyroidism (acquired) Euthyroid Continue levothyroxine 150 mcg daily Follow-up in 6 months, sooner if concerns  Hypercholesterolemia Controlled Continue lipitor 20 mg daily Follow-up in 6 months, sooner if concerns    Patient Counseling: [x]   Nutrition: Stressed importance of moderation in sodium/caffeine intake,  saturated fat and cholesterol, caloric balance, sufficient intake of fresh fruits, vegetables, and fiber  [x]   Stressed the importance of regular exercise.   []   Substance Abuse: Discussed cessation/primary prevention of tobacco, alcohol, or other drug use; driving or other dangerous activities under the influence; availability of treatment for abuse.   [x]   Injury prevention: Discussed safety belts, safety helmets, smoke detector, smoking near bedding or upholstery.   []   Sexuality: Discussed sexually transmitted diseases, partner selection, use of condoms, avoidance of unintended pregnancy  and contraceptive alternatives.   [x]   Dental health: Discussed importance of regular tooth brushing, flossing, and dental visits.  [x]   Health maintenance and immunizations reviewed. Please refer to Health maintenance section.   I,Havlyn C Ratchford,acting as a Education administrator for [employer], PA.,have documented all relevant documentation on the behalf of Inda Coke, PA,as directed by  Inda Coke, PA while in the presence of Inda Coke, [place].  I, Inda Coke, [place], have reviewed all documentation for this visit. The documentation on 09/23/21 for the exam, diagnosis, procedures, and orders are all accurate and complete.  Inda Coke, PA-C [place]

## 2021-09-23 ENCOUNTER — Ambulatory Visit (INDEPENDENT_AMBULATORY_CARE_PROVIDER_SITE_OTHER): Payer: Medicare (Managed Care) | Admitting: Physician Assistant

## 2021-09-23 ENCOUNTER — Encounter: Payer: Self-pay | Admitting: Physician Assistant

## 2021-09-23 ENCOUNTER — Ambulatory Visit (INDEPENDENT_AMBULATORY_CARE_PROVIDER_SITE_OTHER)
Admission: RE | Admit: 2021-09-23 | Discharge: 2021-09-23 | Disposition: A | Discharge status: Home / Self Care | Payer: Medicare (Managed Care) | Source: Ambulatory Visit | Attending: Physician Assistant | Admitting: Physician Assistant

## 2021-09-23 ENCOUNTER — Other Ambulatory Visit: Payer: Self-pay

## 2021-09-23 VITALS — BP 140/80 | HR 69 | Temp 98.1°F | Ht 68.5 in | Wt 250.4 lb

## 2021-09-23 DIAGNOSIS — M25532 Pain in left wrist: Secondary | ICD-10-CM | POA: Diagnosis not present

## 2021-09-23 DIAGNOSIS — E7800 Pure hypercholesterolemia, unspecified: Secondary | ICD-10-CM

## 2021-09-23 DIAGNOSIS — M159 Polyosteoarthritis, unspecified: Secondary | ICD-10-CM | POA: Diagnosis not present

## 2021-09-23 DIAGNOSIS — I10 Essential (primary) hypertension: Secondary | ICD-10-CM

## 2021-09-23 DIAGNOSIS — Z0001 Encounter for general adult medical examination with abnormal findings: Secondary | ICD-10-CM

## 2021-09-23 DIAGNOSIS — E039 Hypothyroidism, unspecified: Secondary | ICD-10-CM

## 2021-09-23 DIAGNOSIS — N401 Enlarged prostate with lower urinary tract symptoms: Secondary | ICD-10-CM

## 2021-09-23 LAB — PSA: PSA: 0.6 ng/mL (ref 0.10–4.00)

## 2021-09-23 NOTE — Patient Instructions (Addendum)
It was great to see you!  Keep an eye on your blood pressure for [place] -- if consistently >150/90, please let us know.  If you have concerns with your memory please let us know.  An order for an xray has been put in for you. To get your xray, you can walk in at the [facility] location without a scheduled appointment.  The address is [address]. It is across the street from [place] is located in the basement.  Hours of operation are M-F 8:30am to 5:00pm. Please note that they are closed for lunch between 12:30 and 1:00pm.   Please go to the lab for blood work.   Our office will call you with your results unless you have chosen to receive results via MyChart.  If your blood work is normal we will follow-up each year for physicals and as scheduled for chronic medical problems.  If anything is abnormal we will treat accordingly and get you in for a follow-up.  Take care,  Aldona Bar

## 2021-09-26 ENCOUNTER — Other Ambulatory Visit: Payer: Self-pay | Admitting: *Deleted

## 2021-09-26 DIAGNOSIS — M1990 Unspecified osteoarthritis, unspecified site: Secondary | ICD-10-CM

## 2021-09-26 DIAGNOSIS — M19039 Primary osteoarthritis, unspecified wrist: Secondary | ICD-10-CM

## 2021-09-29 ENCOUNTER — Encounter: Payer: Self-pay | Admitting: Family Medicine

## 2021-09-29 ENCOUNTER — Ambulatory Visit (INDEPENDENT_AMBULATORY_CARE_PROVIDER_SITE_OTHER): Payer: Medicare (Managed Care) | Admitting: Family Medicine

## 2021-09-29 ENCOUNTER — Other Ambulatory Visit: Payer: Self-pay

## 2021-09-29 ENCOUNTER — Ambulatory Visit: Payer: Self-pay

## 2021-09-29 VITALS — BP 132/82 | HR 82 | Ht 68.5 in | Wt 250.4 lb

## 2021-09-29 DIAGNOSIS — M778 Other enthesopathies, not elsewhere classified: Secondary | ICD-10-CM

## 2021-09-29 DIAGNOSIS — R252 Cramp and spasm: Secondary | ICD-10-CM

## 2021-09-29 DIAGNOSIS — M25532 Pain in left wrist: Secondary | ICD-10-CM | POA: Diagnosis not present

## 2021-09-29 NOTE — Patient Instructions (Addendum)
Nice to meet you today.  I've referred you to hand physical therapy.  Please let us know if you haven't heard from them in the next week regarding scheduling.  Follow-up:6 weeks

## 2021-09-29 NOTE — Progress Notes (Signed)
I, Wendy Poet, LAT, ATC, am serving as scribe for Dr. Lynne Leader.  Subjective:    CC: L wrist pain  HPI: Pt is a 80 y/o male c/o L wrist pain x one year that has worsened.  Pt locates pain to his L ulnar-sided dorsal wrist.  He is also reporting locking of his L thumb.  He describes his thumb being forced into opposition or cramping in a closed hand position.  He does not describe his IP joint getting locked in a flexed position or triggering.  Grip strength: decreased Numbness/tingling: no Aggravates: worse at night; pain w/ L wrist AROM of any type Treatments tried: Meloxicam, compression brace, Voltaren gel  Dx imaging: 09/23/21 L wrist XR  Pertinent review of Systems: No fevers or chills  Relevant historical information: History of DVT.  Hypertension.  History more diffuse arthritis.   Objective:    Vitals:   09/29/21 1246  BP: 132/82  Pulse: 82  SpO2: 95%   General: Well Developed, well nourished, and in no acute distress.   MSK: Left hand and wrist. Slight wrist effusion. Tender palpation dorsal wrist at ulnar styloid. Decreased wrist motion. No triggering present with flexion of thumb at IP joint. Thumb mildly tender palpation at [facility].  Normal thumb motion.  Lab and Radiology Results  Diagnostic Limited MSK Ultrasound of: Left wrist and thumb Dorsal wrist: Hypoechoic fluid tracks around tendon sheath that 6 dorsal wrist compartment indicating ECU tenosynovitis. Small wrist effusion present dorsal wrist. Left thumb CMC DJD. Impression: ECU tenosynovitis.  Wrist effusion.  CMC DJD  EXAM: LEFT WRIST - COMPLETE 3+ VIEW   COMPARISON:  None.   FINDINGS: No evidence of acute fracture. No joint dislocation. Severe degenerative change at the wrist with severe narrowing of the radiocarpal joint and distal radial and ulnar degenerative remodeling. Moderate triscaphe and first CMC degenerative change.   IMPRESSION: 1. Severe degenerative change at the  wrist with severe narrowing of the radiocarpal joint and distal radial and ulnar degenerative remodeling. 2. Moderate triscaphe and first [place] degenerative change. 3. No evidence of acute fracture or joint dislocation.     Electronically Signed   By: Margaretha Sheffield M.D.   On: 09/25/2021 09:32 I, Lynne Leader, personally (independently) visualized and performed the interpretation of the images attached in this note.    Impression and Recommendations:    Assessment and Plan: 80 y.o. male with left wrist and thumb pain.  Patient does have considerable degenerative changes on x-ray.  However the dominant cause of his dorsal ulnar wrist pain is tendinitis.  He is a great candidate for physical therapy for this.  Also recommend Voltaren gel.  Is for the thumb cramping issue I believe fundamentally he has some weakness into his hand that may be contributory.  This also should be improved by physical therapy.  Plan for hand PT and recheck back in about 6 weeks.Marland Kitchen  PDMP not reviewed this encounter. Orders Placed This Encounter  Procedures   [place] LIMITED JOINT SPACE STRUCTURES UP LEFT(NO LINKED CHARGES)    Order Specific Question:   Reason for Exam (SYMPTOM  OR DIAGNOSIS REQUIRED)    Answer:   L wrist pain    Order Specific Question:   Preferred imaging location?    Answer:   [place]   Ambulatory referral to Physical Therapy    Referral Priority:   Routine    Referral Type:   Physical Medicine    Referral Reason:   Specialty  Services Required    Requested Specialty:   Physical Therapy    Number of Visits Requested:   1   No orders of the defined types were placed in this encounter.   Discussed warning signs or symptoms. Please see discharge instructions. Patient expresses understanding.   The above documentation has been reviewed and is accurate and complete Lynne Leader, M.D.

## 2021-10-12 ENCOUNTER — Other Ambulatory Visit: Payer: Self-pay | Admitting: Physician Assistant

## 2021-10-13 ENCOUNTER — Telehealth: Payer: Self-pay

## 2021-10-13 MED ORDER — TAMSULOSIN HCL 0.4 MG PO CAPS: 0.4000 mg | ORAL_CAPSULE | Freq: Every day | ORAL | 5 refills | Status: DC

## 2021-10-13 NOTE — Telephone Encounter (Signed)
LAST APPOINTMENT DATE:  09/23/21  NEXT APPOINTMENT DATE: None  MEDICATION:meloxicam (MOBIC) 15 MG tablet  tamsulosin (FLOMAX) 0.4 MG CAPS capsule  PHARMACY: [place], [place]

## 2021-10-13 NOTE — Telephone Encounter (Signed)
Spoke to pt's wife [name] told her Rx's were sent to pharmacy as requested. [name] verbalized understanding.

## 2021-10-30 ENCOUNTER — Other Ambulatory Visit: Payer: Self-pay | Admitting: Physician Assistant

## 2021-11-03 NOTE — Progress Notes (Signed)
Mitchell Humphrey is a 80 y.o. male here for a follow up of a pre-existing problem.  History of Present Illness:   Chief Complaint  Patient presents with   Hypothyroidism    Needs refill on Levothyroxine    HPI  Hypothyroidism Needs refill on his levothyroxine. He takes 150 mcg daily. Denies: unintentional weight changes, hair/skin/bowel changes. He is back at work and doing well.    Past Medical History:  Diagnosis Date   Allergy    Arthritis    BPH (benign prostatic hyperplasia)    Cataract    Dyslipidemia    Erectile dysfunction    GERD (gastroesophageal reflux disease)    Hx of adenomatous colonic polyps    Hyperlipidemia    Hypertension    Hypogonadism male    Hypothyroid    Obesity      Social History   Tobacco Use   Smoking status: Never   Smokeless tobacco: Never   Tobacco comments:    He chews on a cigar  Vaping Use   Vaping Use: Never used  Substance Use Topics   Alcohol use: No   Drug use: No    Past Surgical History:  Procedure Laterality Date   COLONOSCOPY  08/2008   Dr. Watt Climes   HERNIA REPAIR  1970   [facility]    Family History  Problem Relation Age of Onset   Breast cancer Mother    Lung cancer Father    Brain cancer Father    Breast cancer Sister    Heart attack Sister     Allergies  Allergen Reactions   Penicillins     Current Medications:   Current Outpatient Medications:    aspirin EC 81 MG tablet, Take 81 mg by mouth daily. Swallow whole., Disp: , Rfl:    atorvastatin (LIPITOR) 20 MG tablet, Take 1 tablet by mouth once daily, Disp: 90 tablet, Rfl: 0   Cholecalciferol (VITAMIN D3) 125 MCG (5000 UT) CAPS, Take 1 capsule by mouth daily in the afternoon., Disp: , Rfl:    doxycycline (VIBRA-TABS) 100 MG tablet, Take 1 tablet (100 mg total) by mouth 2 (two) times daily., Disp: 28 tablet, Rfl: 0   enalapril (VASOTEC) 10 MG tablet, Take 1 tablet (10 mg total) by mouth daily., Disp: 90 tablet, Rfl: 1   finasteride (PROSCAR) 5 MG tablet,  Take 1 tablet by mouth once daily, Disp: 90 tablet, Rfl: 0   fluticasone (FLONASE) 50 MCG/ACT nasal spray, Place 2 sprays into both nostrils daily., Disp: 16 g, Rfl: 2   hydrochlorothiazide (MICROZIDE) 12.5 MG capsule, Take 1 capsule (12.5 mg total) by mouth daily., Disp: 90 capsule, Rfl: 1   levothyroxine (SYNTHROID) 150 MCG tablet, TAKE 1 TABLET BY MOUTH ONCE DAILY BEFORE BREAKFAST **NEED  TO  MAKE  AN  APPOINTMENT**, Disp: 30 tablet, Rfl: 0   loratadine (CLARITIN) 10 MG tablet, Take 10 mg by mouth daily., Disp: , Rfl:    meloxicam (MOBIC) 15 MG tablet, Take 1 tablet by mouth once daily, Disp: 30 tablet, Rfl: 0   OVER THE COUNTER MEDICATION, Urinozinc Prostate Plus, Disp: , Rfl:    OVER THE COUNTER MEDICATION, 400 mg daily. Mucus Relief, Disp: , Rfl:    OVER THE COUNTER MEDICATION, 1,000 mg. GRACINIA, Disp: , Rfl:    OVER THE COUNTER MEDICATION, Vita Fruits + Veggies, Disp: , Rfl:    OVER THE COUNTER MEDICATION, NITR Immune Protect, Disp: , Rfl:    Saw Palmetto 1000 MG CAPS, Take by mouth.  Take 2 capsules in the MORNING., Disp: , Rfl:    tamsulosin (FLOMAX) 0.4 MG CAPS capsule, Take 1 capsule (0.4 mg total) by mouth daily., Disp: 30 capsule, Rfl: 5   vitamin C (ASCORBIC ACID) 500 MG tablet, Take 500 mg by mouth daily., Disp: , Rfl:    Review of Systems:   ROS Negative unless otherwise specified per HPI.  Vitals:   Vitals:   11/04/21 0819  BP: (!) 150/80  Pulse: 73  Temp: 98.1 F (36.7 C)  TempSrc: Temporal  SpO2: 96%  Weight: 249 lb 6.1 oz (113.1 kg)  Height: 5' 8.5" (1.74 m)     Body mass index is 37.37 kg/m.  Physical Exam:   Physical Exam Vitals and nursing note reviewed.  Constitutional:      General: He is not in acute distress.    Appearance: He is well-developed. He is not ill-appearing or toxic-appearing.  Cardiovascular:     Rate and Rhythm: Normal rate and regular rhythm.     Pulses: Normal pulses.     Heart sounds: Normal heart sounds, S1 normal and S2  normal.  Pulmonary:     Effort: Pulmonary effort is normal.     Breath sounds: Normal breath sounds.  Skin:    General: Skin is warm and dry.  Neurological:     Mental Status: He is alert.     GCS: GCS eye subscore is 4. GCS verbal subscore is 5. GCS motor subscore is 6.  Psychiatric:        Speech: Speech normal.        Behavior: Behavior normal. Behavior is cooperative.    Assessment and Plan:   Hypothyroidism (acquired) Suspect euthyroid Update TSH and adjust levothyroxine 150 mcg daily as indicated Follow-up in 6 months, sooner if concerns  I,Havlyn C Ratchford,acting as a scribe for [employer], PA.,have documented all relevant documentation on the behalf of Inda Coke, PA,as directed by  Inda Coke, PA while in the presence of Inda Coke, [place].  I, Inda Coke, [place], have reviewed all documentation for this visit. The documentation on 11/04/21 for the exam, diagnosis, procedures, and orders are all accurate and complete.   Inda Coke, PA-C

## 2021-11-04 ENCOUNTER — Encounter: Payer: Self-pay | Admitting: Physician Assistant

## 2021-11-04 ENCOUNTER — Other Ambulatory Visit: Payer: Self-pay | Admitting: Physician Assistant

## 2021-11-04 ENCOUNTER — Ambulatory Visit (INDEPENDENT_AMBULATORY_CARE_PROVIDER_SITE_OTHER): Payer: Medicare (Managed Care) | Admitting: Physician Assistant

## 2021-11-04 VITALS — BP 150/80 | HR 73 | Temp 98.1°F | Ht 68.5 in | Wt 249.4 lb

## 2021-11-04 DIAGNOSIS — E039 Hypothyroidism, unspecified: Secondary | ICD-10-CM | POA: Diagnosis not present

## 2021-11-04 LAB — TSH: TSH: 1.64 u[IU]/mL (ref 0.35–5.50)

## 2021-11-04 MED ORDER — LEVOTHYROXINE SODIUM 150 MCG PO TABS: ORAL_TABLET | ORAL | 1 refills | Status: DC

## 2021-11-04 NOTE — Patient Instructions (Signed)
It was great to see you!  I will be in touch with your blood work results and fill medication.  Let's follow-up in 6 months, sooner if you have concerns.  Take care,  Inda Coke PA-C

## 2021-11-06 NOTE — Progress Notes (Signed)
° °  I, Mitchell Humphrey, LAT, ATC acting as a scribe for Mitchell Leader, MD.  Mitchell Humphrey is a 80 y.o. male who presents to [facility] today for f/u of L ulnar-sided wrist pain and L thumb pain.  He was last seen by Dr. Georgina Snell on 09/29/21 and was referred to hand PT to [facility] and completed 1 visit.  Today, pt reports he has been wearing a brace and working on some HEP. Pt notes decreased pain and soreness.   Diagnostic testing: L wrist XR- 09/23/21  Pertinent review of systems: No fevers or chills  Relevant historical information: DVT.  Hypothyroidism.   Exam:  BP (!) 142/88    Pulse 81    Ht 5' 8.5" (1.74 m)    Wt 248 lb (112.5 kg)    SpO2 97%    BMI 37.16 kg/m  General: Well Developed, well nourished, and in no acute distress.   MSK: Left wrist slightly swelling.  Decreased wrist motion.    Lab and Radiology Results  EXAM: LEFT WRIST - COMPLETE 3+ VIEW   COMPARISON:  None.   FINDINGS: No evidence of acute fracture. No joint dislocation. Severe degenerative change at the wrist with severe narrowing of the radiocarpal joint and distal radial and ulnar degenerative remodeling. Moderate triscaphe and first CMC degenerative change.   IMPRESSION: 1. Severe degenerative change at the wrist with severe narrowing of the radiocarpal joint and distal radial and ulnar degenerative remodeling. 2. Moderate triscaphe and first [place] degenerative change. 3. No evidence of acute fracture or joint dislocation.     Electronically Signed   By: Margaretha Sheffield M.D.   On: 09/25/2021 09:32 I, Mitchell Humphrey, personally (independently) visualized and performed the interpretation of the images attached in this note.      Assessment and Plan: 80 y.o. male with left wrist pain due to ulnar dorsal tendinitis and arthritis.  Improved with hand PT and bracing and home exercise program.  Plan to continue home exercise program and recheck back as needed.  Could  consider injection into the wrist joint itself in the future if needed.  Recheck as needed.  Precautions reviewed.  Next steps reviewed. Total encounter time 20 minutes including face-to-face time with the patient and, reviewing past medical record, and charting on the date of service.      Discussed warning signs or symptoms. Please see discharge instructions. Patient expresses understanding.  The above documentation has been reviewed and is accurate and complete Mitchell Humphrey, M.D.

## 2021-11-11 ENCOUNTER — Other Ambulatory Visit: Payer: Self-pay

## 2021-11-11 ENCOUNTER — Ambulatory Visit (INDEPENDENT_AMBULATORY_CARE_PROVIDER_SITE_OTHER): Payer: Medicare (Managed Care) | Admitting: Family Medicine

## 2021-11-11 VITALS — BP 142/88 | HR 81 | Ht 68.5 in | Wt 248.0 lb

## 2021-11-11 DIAGNOSIS — M25532 Pain in left wrist: Secondary | ICD-10-CM

## 2021-11-11 NOTE — Patient Instructions (Signed)
Thank you for coming in today.   Continue working on the exercises and home and wearing the brace.  Recheck back as needed.  Have a happy New Year!

## 2021-11-14 ENCOUNTER — Other Ambulatory Visit: Payer: Self-pay | Admitting: Physician Assistant

## 2021-12-11 ENCOUNTER — Other Ambulatory Visit: Payer: Self-pay | Admitting: *Deleted

## 2021-12-11 MED ORDER — ATORVASTATIN CALCIUM 20 MG PO TABS: 20.0000 mg | ORAL_TABLET | Freq: Every day | ORAL | 1 refills | Status: DC

## 2021-12-11 MED ORDER — MELOXICAM 15 MG PO TABS: 15.0000 mg | ORAL_TABLET | Freq: Every day | ORAL | 1 refills | Status: DC

## 2021-12-11 MED ORDER — ENALAPRIL MALEATE 10 MG PO TABS: 10.0000 mg | ORAL_TABLET | Freq: Every day | ORAL | 1 refills | Status: DC

## 2021-12-14 ENCOUNTER — Other Ambulatory Visit: Payer: Self-pay | Admitting: Physician Assistant

## 2021-12-15 ENCOUNTER — Other Ambulatory Visit: Payer: Self-pay | Admitting: *Deleted

## 2021-12-15 MED ORDER — LEVOTHYROXINE SODIUM 150 MCG PO TABS: ORAL_TABLET | ORAL | 1 refills | Status: DC

## 2021-12-15 MED ORDER — TAMSULOSIN HCL 0.4 MG PO CAPS: 0.4000 mg | ORAL_CAPSULE | Freq: Every day | ORAL | 1 refills | Status: DC

## 2021-12-15 MED ORDER — HYDROCHLOROTHIAZIDE 12.5 MG PO CAPS: 12.5000 mg | ORAL_CAPSULE | Freq: Every day | ORAL | 1 refills | Status: DC

## 2021-12-15 MED ORDER — FINASTERIDE 5 MG PO TABS: 5.0000 mg | ORAL_TABLET | Freq: Every day | ORAL | 1 refills | Status: DC

## 2021-12-26 ENCOUNTER — Ambulatory Visit (HOSPITAL_COMMUNITY): Payer: Medicare PPO | Attending: Cardiology

## 2021-12-26 ENCOUNTER — Other Ambulatory Visit: Payer: Self-pay

## 2021-12-26 DIAGNOSIS — R079 Chest pain, unspecified: Secondary | ICD-10-CM | POA: Insufficient documentation

## 2021-12-26 LAB — ECHOCARDIOGRAM COMPLETE
AR max vel: 1.24 cm2
AV Area VTI: 1.21 cm2
AV Area mean vel: 1.3 cm2
AV Mean grad: 26.5 mmHg
AV Peak grad: 46 mmHg
Ao pk vel: 3.39 m/s
Area-P 1/2: 3.85 cm2
S' Lateral: 3.2 cm

## 2022-01-04 NOTE — Progress Notes (Signed)
Cardiology Office Note:   Date:  01/07/2022  NAME:  Mitchell Humphrey    MRN: [id number] DOB:  12/01/1940   PCP:  Inda Coke, PA  Cardiologist:  None  Electrophysiologist:  None   Referring MD: Inda Coke, PA   Chief Complaint  Patient presents with   Follow-up         History of Present Illness:   Mitchell Humphrey is a 81 y.o. male with a hx of aortic stenosis, coronary calcifications who presents for follow-up.  He presents for follow-up.  Echo shows his valve has progressed.  Murmur is now late peaking.  I do fear he has severe aortic stenosis.  Recommended aortic valve calcium score to clarify.  He reports he has been fatigued lately.  No chest pain or trouble breathing.  He is low energy.  Could be related to his heart valve.  He was diabetic with an A1c of 7.2 last year.  On no medication.  I would like to recheck his A1c.  I would also like to recheck a full panel of labs.  He reports he underwent screening for colon cancer with fecal occult blood testing.  This was negative.  He is walking up to 1 mile per day.  Blood pressure slightly elevated.  Has been running well at home.  Late peaking murmur is concerning.  Coronary calcification on CT.  Problem List 1. DVT -L gastrocnemius vein 12/17/2019 -in setting of Covid PNA 2. Moderate AS -V max 2.8 m/s, MG 22 mmHG, AVA 1.38 cm2, DI 0.33 (12/26/2020) -Vmax 3.5 m/s, MG 27 mm2, AVA 1.4, DI 0.22 3. HTN 4. HLD -T chol 135, HDL 37, LDL 61, TG 181 5.  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 DM -A1c 7.2  Past Medical History: Past Medical History:  Diagnosis Date   Allergy    Arthritis    BPH (benign prostatic hyperplasia)    Cataract    Dyslipidemia    Erectile dysfunction    GERD (gastroesophageal reflux disease)    Hx of adenomatous colonic polyps    Hyperlipidemia    Hypertension    Hypogonadism male    Hypothyroid    Obesity     Past Surgical History: Past Surgical History:  Procedure  Laterality Date   COLONOSCOPY  08/2008   Dr. Watt Climes   HERNIA REPAIR  1970   [facility]    Current Medications: No outpatient medications have been marked as taking for the 01/07/22 encounter (Office Visit) with Geralynn Rile, MD.     Allergies:    Penicillins   Social History: Social History   Socioeconomic History   Marital status: Married    Spouse name: Not on file   Number of children: 1   Years of education: Not on file   Highest education level: Not on file  Occupational History   Occupation: Scientist, research (medical)  Tobacco Use   Smoking status: Never   Smokeless tobacco: Never   Tobacco comments:    He chews on a cigar  Vaping Use   Vaping Use: Never used  Substance and Sexual Activity   Alcohol use: No   Drug use: No   Sexual activity: Not Currently  Other Topics Concern   Not on file  Social History Narrative   Superintendent for construction site   Lives with wife   2 step children   2 sons   Social Determinants of Health   Financial Resource Strain: Not on file  Food Insecurity: Not on file  Transportation Needs: Not on file  Physical Activity: Not on file  Stress: Not on file  Social Connections: Not on file     Family History: The patient's family history includes Brain cancer in his father; Breast cancer in his mother and sister; Heart attack in his sister; Lung cancer in his father.  ROS:   All other ROS reviewed and negative. Pertinent positives noted in the HPI.     EKGs/Labs/Other Studies Reviewed:   The following studies were personally reviewed by me today:  EKG:  EKG is ordered today.  The ekg ordered today demonstrates NSR 65 bpm, 1AVB, and was personally reviewed by me.   Recent Labs: 07/14/2021: ALT 18; BUN 17; Creatinine, Ser 0.82; Hemoglobin 12.6; Platelets 239.0; Potassium 4.3; Sodium 140 11/04/2021: TSH 1.64   Recent Lipid Panel    Component Value Date/Time   CHOL 135 07/14/2021 1113   TRIG 181.0 (H) 07/14/2021  1113   HDL 37.30 (L) 07/14/2021 1113   CHOLHDL 4 07/14/2021 1113   VLDL 36.2 07/14/2021 1113   LDLCALC 61 07/14/2021 1113    Physical Exam:   VS:  BP (!) 152/72    Pulse 65    Ht 5\' 10"  (1.778 m)    Wt 252 lb (114.3 kg)    SpO2 98%    BMI 36.16 kg/m    Wt Readings from Last 3 Encounters:  01/07/22 252 lb (114.3 kg)  11/11/21 248 lb (112.5 kg)  11/04/21 249 lb 6.1 oz (113.1 kg)    General: Well nourished, well developed, in no acute distress Head: Atraumatic, normal size  Eyes: PEERLA, EOMI  Neck: Supple, no JVD Endocrine: No thryomegaly Cardiac: Normal S1, S2; RRR; 3 out of 6 systolic ejection murmur, late peaking, absent S2 Lungs: Clear to auscultation bilaterally, no wheezing, rhonchi or rales  Abd: Soft, nontender, no hepatomegaly  Ext: No edema, pulses 2+ Musculoskeletal: No deformities, BUE and BLE strength normal and equal Skin: Warm and dry, no rashes   Neuro: Alert and oriented to person, place, time, and situation, CNII-XII grossly intact, no focal deficits  Psych: Normal mood and affect   ASSESSMENT:   Mitchell Humphrey is a 81 y.o. male who presents for the following: 1. Aortic stenosis, moderate   2. Primary hypertension   3. Other fatigue     PLAN:   1. Aortic stenosis, moderate -Echocardiogram shows aortic stenosis has progressed.  Dimensionless index in the severe range.  V-max 3.5 m/s with mean gradient 27 mmHg.  His murmur is very consistent with severe aortic stenosis.  It is late peaking.  There is an absent S2.  He now describes symptoms of fatigue.  No overt shortness of breath or chest pain but I believe based on the appearance of the valve that is severe.  I recommended an aortic valve calcium score for clarification. -For his fatigue I would like to check a full panel of labs including A1c, TSH, BMP and CBC.  He was diabetic last year so hypoglycemia could explain his symptoms of fatigue.  We will recheck this and reevaluate.  Lungs are clear.  Exam really  unremarkable.  Sinus rhythm with heart rate 65.  No arrhythmias on EKG today.  Suspect aortic valve could be contributing. -Blood pressure is also elevated.  Recommended to continue current medications and start to check it at home.  He reports values have been controlled.  2. Primary hypertension -Elevated today.  Would like for him  to start checking his blood pressure daily.  Echo shows normal LV function.  He is on Lipitor 20 mg daily.  LDL cholesterol was at goal.  He will continue enalapril 10 mg daily as well as HCTZ 12.5 mg daily.  May need to titrate therapy based on follow-up values.  3. Other fatigue -Symptoms of fatigue.  Now with likely severe aortic stenosis.  Would like to recheck full panel of labs including CBC, TSH, BMP and A1c.  I am a bit concerned he may have diabetes to explain his symptoms.  Clearly aortic valve could be contributing.  Further work-up as above.  Disposition: Return in about 6 months (around 07/07/2022).  Medication Adjustments/Labs and Tests Ordered: Current medicines are reviewed at length with the patient today.  Concerns regarding medicines are outlined above.  Orders Placed This Encounter  Procedures   CT CARDIAC SCORING (SELF PAY ONLY)   CBC   Basic metabolic panel   TSH   Hemoglobin A1c   EKG 12-Lead   No orders of the defined types were placed in this encounter.   Patient Instructions  Medication Instructions:  The current medical regimen is effective;  continue present plan and medications.  *If you need a refill on your cardiac medications before your next appointment, please call your pharmacy*   Lab Work: CBC, TSH, BMET, A1C   If you have labs (blood work) drawn today and your tests are completely normal, you will receive your results only by: [address] (if you have MyChart) OR A paper copy in the mail If you have any lab test that is abnormal or we need to change your treatment, we will call you to review the  results.   Testing/Procedures: CALCIUM SCORE    Follow-Up: At [facility], you and your health needs are our priority.  As part of our continuing mission to provide you with exceptional heart care, we have created designated Provider Care Teams.  These Care Teams include your primary Cardiologist (physician) and Advanced Practice Providers (APPs -  Physician Assistants and Nurse Practitioners) who all work together to provide you with the care you need, when you need it.  We recommend signing up for the patient portal called "MyChart".  Sign up information is provided on this After Visit Summary.  MyChart is used to connect with patients for Virtual Visits (Telemedicine).  Patients are able to view lab/test results, encounter notes, upcoming appointments, etc.  Non-urgent messages can be sent to your provider as well.   To learn more about what you can do with MyChart, go to [email or web address].    Your next appointment:   6 month(s)  The format for your next appointment:   In Person  Provider:   Eleonore Chiquito, MD       Time Spent with Patient: I have spent a total of 35 minutes with patient reviewing hospital notes, telemetry, EKGs, labs and examining the patient as well as establishing an assessment and plan that was discussed with the patient.  > 50% of time was spent in direct patient care.  Signed, Addison Naegeli. Audie Box, MD, [place]  [address], [place], [place] [address] [phone]  01/07/2022 10:34 AM

## 2022-01-07 ENCOUNTER — Encounter: Payer: Self-pay | Admitting: Cardiovascular Disease

## 2022-01-07 ENCOUNTER — Other Ambulatory Visit: Payer: Self-pay

## 2022-01-07 ENCOUNTER — Ambulatory Visit: Payer: Medicare PPO | Admitting: Cardiovascular Disease

## 2022-01-07 VITALS — BP 152/72 | HR 65 | Ht 70.0 in | Wt 252.0 lb

## 2022-01-07 DIAGNOSIS — R5383 Other fatigue: Secondary | ICD-10-CM | POA: Diagnosis not present

## 2022-01-07 DIAGNOSIS — I10 Essential (primary) hypertension: Secondary | ICD-10-CM | POA: Diagnosis not present

## 2022-01-07 DIAGNOSIS — I350 Nonrheumatic aortic (valve) stenosis: Secondary | ICD-10-CM

## 2022-01-07 NOTE — Patient Instructions (Signed)
Medication Instructions:  The current medical regimen is effective;  continue present plan and medications.  *If you need a refill on your cardiac medications before your next appointment, please call your pharmacy*   Lab Work: CBC, TSH, BMET, A1C   If you have labs (blood work) drawn today and your tests are completely normal, you will receive your results only by: [place] (if you have MyChart) OR A paper copy in the mail If you have any lab test that is abnormal or we need to change your treatment, we will call you to review the results.   Testing/Procedures: CALCIUM SCORE    Follow-Up: At [facility], you and your health needs are our priority.  As part of our continuing mission to provide you with exceptional heart care, we have created designated Provider Care Teams.  These Care Teams include your primary Cardiologist (physician) and Advanced Practice Providers (APPs -  Physician Assistants and Nurse Practitioners) who all work together to provide you with the care you need, when you need it.  We recommend signing up for the patient portal called "MyChart".  Sign up information is provided on this After Visit Summary.  MyChart is used to connect with patients for Virtual Visits (Telemedicine).  Patients are able to view lab/test results, encounter notes, upcoming appointments, etc.  Non-urgent messages can be sent to your provider as well.   To learn more about what you can do with MyChart, go to [email or web address].    Your next appointment:   6 month(s)  The format for your next appointment:   In Person  Provider:   Eleonore Chiquito, MD

## 2022-01-08 LAB — CBC
Hematocrit: 40.3 % (ref 37.5–51.0)
Hemoglobin: 13.4 g/dL (ref 13.0–17.7)
MCH: 27.5 pg (ref 26.6–33.0)
MCHC: 33.3 g/dL (ref 31.5–35.7)
MCV: 83 fL (ref 79–97)
Platelets: 249 10*3/uL (ref 150–450)
RBC: 4.88 x10E6/uL (ref 4.14–5.80)
RDW: 14.3 % (ref 11.6–15.4)
WBC: 8.5 10*3/uL (ref 3.4–10.8)

## 2022-01-08 LAB — BASIC METABOLIC PANEL
BUN/Creatinine Ratio: 17 (ref 10–24)
BUN: 12 mg/dL (ref 8–27)
CO2: 20 mmol/L (ref 20–29)
Calcium: 9.2 mg/dL (ref 8.6–10.2)
Chloride: 103 mmol/L (ref 96–106)
Creatinine, Ser: 0.71 mg/dL — ABNORMAL LOW (ref 0.76–1.27)
Glucose: 112 mg/dL — ABNORMAL HIGH (ref 70–99)
Potassium: 4.7 mmol/L (ref 3.5–5.2)
Sodium: 141 mmol/L (ref 134–144)
eGFR: 93 mL/min/{1.73_m2} (ref >59)

## 2022-01-08 LAB — HEMOGLOBIN A1C
Est. average glucose Bld gHb Est-mCnc: 137 mg/dL
Hgb A1c MFr Bld: 6.4 % — ABNORMAL HIGH (ref 4.8–5.6)

## 2022-01-08 LAB — TSH: TSH: 0.514 u[IU]/mL (ref 0.450–4.500)

## 2022-01-09 DIAGNOSIS — H4322 Crystalline deposits in vitreous body, left eye: Secondary | ICD-10-CM | POA: Diagnosis not present

## 2022-01-09 DIAGNOSIS — H4312 Vitreous hemorrhage, left eye: Secondary | ICD-10-CM | POA: Diagnosis not present

## 2022-01-09 DIAGNOSIS — H35371 Puckering of macula, right eye: Secondary | ICD-10-CM | POA: Diagnosis not present

## 2022-01-09 DIAGNOSIS — H43392 Other vitreous opacities, left eye: Secondary | ICD-10-CM | POA: Diagnosis not present

## 2022-01-09 DIAGNOSIS — H33322 Round hole, left eye: Secondary | ICD-10-CM | POA: Diagnosis not present

## 2022-01-09 DIAGNOSIS — H43812 Vitreous degeneration, left eye: Secondary | ICD-10-CM | POA: Diagnosis not present

## 2022-01-09 DIAGNOSIS — Z961 Presence of intraocular lens: Secondary | ICD-10-CM | POA: Diagnosis not present

## 2022-01-12 DIAGNOSIS — H33322 Round hole, left eye: Secondary | ICD-10-CM | POA: Diagnosis not present

## 2022-02-02 DIAGNOSIS — H43812 Vitreous degeneration, left eye: Secondary | ICD-10-CM | POA: Diagnosis not present

## 2022-02-02 DIAGNOSIS — H4312 Vitreous hemorrhage, left eye: Secondary | ICD-10-CM | POA: Diagnosis not present

## 2022-02-02 DIAGNOSIS — H43392 Other vitreous opacities, left eye: Secondary | ICD-10-CM | POA: Diagnosis not present

## 2022-02-02 DIAGNOSIS — H35371 Puckering of macula, right eye: Secondary | ICD-10-CM | POA: Diagnosis not present

## 2022-02-02 DIAGNOSIS — H31092 Other chorioretinal scars, left eye: Secondary | ICD-10-CM | POA: Diagnosis not present

## 2022-02-17 ENCOUNTER — Ambulatory Visit (INDEPENDENT_AMBULATORY_CARE_PROVIDER_SITE_OTHER): Payer: Medicare PPO | Admitting: Physician Assistant

## 2022-02-17 ENCOUNTER — Encounter: Payer: Self-pay | Admitting: Physician Assistant

## 2022-02-17 VITALS — BP 140/78 | HR 78 | Temp 98.3°F | Ht 70.0 in | Wt 249.6 lb

## 2022-02-17 DIAGNOSIS — R0981 Nasal congestion: Secondary | ICD-10-CM

## 2022-02-17 MED ORDER — DOXYCYCLINE HYCLATE 100 MG PO TABS: 100.0000 mg | ORAL_TABLET | Freq: Two times a day (BID) | ORAL | 0 refills | Status: DC

## 2022-02-17 NOTE — Progress Notes (Signed)
Mitchell Humphrey is a 81 y.o. male here for a URI. ? ?History of Present Illness:  ? ?Chief Complaint  ?Patient presents with  ? Sinus Problem  ?  Pt stated that he feels that he has a sinus infection headache, nose stopped up, runny nose/stuffy. This has been going on for the past week or more. Tried OTC and it did seem to help a little but not much. Pt also experiencing some dizzy spells. When he walks he feels like he is drunk, and when he goes to stand up it feels the same way.  ? ? ?Sinus Congestion ?Mitchell Humphrey presents with c/o sinus congestion that has been onset for a week. Pt states he has also been experiencing rhinorrhea, headache, dizziness and what he describes as feeling off balance. He goes on to compare this unbalanced feeling to him being drunk upon standing or walking. In terms of his headaches, he states they are frontal,become gradually onset and can be rated a 4-5 out of 10. Due to sx, he believed he had a sinus infection and took an OTC medication. Although the medication provided him with minor relief, he was still experiencing sx.  ? ?Denies fever, chills, chest pain, shortness of breath, or sore throat.  ? ?In addition to current sx, pt has also been experiencing increased feelings of fatigue. States that he tries to walk the same route regularly and usually has no problem with this, but unfortunately has to turn around halfway through completion. 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 Audie Box, cardiology. He also has a calcium CT scan scheduled for 02/23/22. Blood work was also obtained at Feb visit with cardiology -- we reviewed this today and it was overall WNL. ? ? ?Past Medical History:  ?Diagnosis Date  ? Allergy   ? Arthritis   ? BPH (benign prostatic hyperplasia)   ? Cataract   ? Dyslipidemia   ? Erectile dysfunction   ? GERD (gastroesophageal reflux disease)   ? Hx of adenomatous colonic polyps   ? Hyperlipidemia   ? Hypertension   ? Hypogonadism male   ?  Hypothyroid   ? Obesity   ? ?  ?Social History  ? ?Tobacco Use  ? Smoking status: Never  ? Smokeless tobacco: Never  ? Tobacco comments:  ?  He chews on a cigar  ?Vaping Use  ? Vaping Use: Never used  ?Substance Use Topics  ? Alcohol use: No  ? Drug use: No  ? ? ?Past Surgical History:  ?Procedure Laterality Date  ? COLONOSCOPY  08/2008  ? Dr. Watt Climes  ? HERNIA REPAIR  1970  ? [place]  ? ? ?Family History  ?Problem Relation Age of Onset  ? Breast cancer Mother   ? Lung cancer Father   ? Brain cancer Father   ? Breast cancer Sister   ? Heart attack Sister   ? ? ?Allergies  ?Allergen Reactions  ? Penicillins   ? ? ?Current Medications:  ? ?Current Outpatient Medications:  ?  aspirin EC 81 MG tablet, Take 81 mg by mouth daily. Swallow whole., Disp: , Rfl:  ?  atorvastatin (LIPITOR) 20 MG tablet, Take 1 tablet (20 mg total) by mouth daily., Disp: 90 tablet, Rfl: 1 ?  Cholecalciferol (VITAMIN D3) 125 MCG (5000 UT) CAPS, Take 1 capsule by mouth daily in the afternoon., Disp: , Rfl:  ?  doxycycline (VIBRA-TABS) 100 MG tablet, Take 1 tablet (100 mg total) by mouth 2 (  two) times daily., Disp: 28 tablet, Rfl: 0 ?  enalapril (VASOTEC) 10 MG tablet, Take 1 tablet (10 mg total) by mouth daily., Disp: 90 tablet, Rfl: 1 ?  finasteride (PROSCAR) 5 MG tablet, Take 1 tablet (5 mg total) by mouth daily., Disp: 90 tablet, Rfl: 1 ?  fluticasone (FLONASE) 50 MCG/ACT nasal spray, Place 2 sprays into both nostrils daily., Disp: 16 g, Rfl: 2 ?  hydrochlorothiazide (MICROZIDE) 12.5 MG capsule, Take 1 capsule (12.5 mg total) by mouth daily., Disp: 90 capsule, Rfl: 1 ?  levothyroxine (SYNTHROID) 150 MCG tablet, Take 1 tablet po each morning prior to breakfast, Disp: 90 tablet, Rfl: 1 ?  loratadine (CLARITIN) 10 MG tablet, Take 10 mg by mouth daily., Disp: , Rfl:  ?  meloxicam (MOBIC) 15 MG tablet, Take 1 tablet (15 mg total) by mouth daily., Disp: 90 tablet, Rfl: 1 ?  OVER THE COUNTER MEDICATION, Urinozinc Prostate Plus, Disp: , Rfl:  ?  OVER THE  COUNTER MEDICATION, 400 mg daily. Mucus Relief, Disp: , Rfl:  ?  OVER THE COUNTER MEDICATION, 1,000 mg. GRACINIA, Disp: , Rfl:  ?  OVER THE COUNTER MEDICATION, Vita Fruits + Veggies, Disp: , Rfl:  ?  OVER THE COUNTER MEDICATION, NITR Immune Protect, Disp: , Rfl:  ?  Saw Palmetto 1000 MG CAPS, Take by mouth. Take 2 capsules in the MORNING., Disp: , Rfl:  ?  tamsulosin (FLOMAX) 0.4 MG CAPS capsule, Take 1 capsule (0.4 mg total) by mouth daily., Disp: 90 capsule, Rfl: 1 ?  vitamin C (ASCORBIC ACID) 500 MG tablet, Take 500 mg by mouth daily., Disp: , Rfl:   ? ?Review of Systems:  ? ?ROS ?Negative unless otherwise specified per HPI. ?Vitals:  ? ?Vitals:  ? 02/17/22 1150  ?BP: 140/78  ?Pulse: 78  ?Temp: 98.3 ?F (36.8 ?C)  ?SpO2: 98%  ?Weight: 249 lb 9.6 oz (113.2 kg)  ?Height: '5\' 10"'$  (1.778 m)  ?   ?Body mass index is 35.81 kg/m?. ? ?Physical Exam:  ? ?Physical Exam ?Vitals and nursing note reviewed.  ?Constitutional:   ?   General: He is not in acute distress. ?   Appearance: He is well-developed. He is not ill-appearing or toxic-appearing.  ?HENT:  ?   Head: Normocephalic and atraumatic.  ?   Right Ear: Tympanic membrane, ear canal and external ear normal. Tympanic membrane is not erythematous, retracted or bulging.  ?   Left Ear: Tympanic membrane, ear canal and external ear normal. Tympanic membrane is not erythematous, retracted or bulging.  ?   Nose: Congestion present.  ?   Mouth/Throat:  ?   Pharynx: Uvula midline. No posterior oropharyngeal erythema.  ?Eyes:  ?   General: Lids are normal.  ?   Conjunctiva/sclera: Conjunctivae normal.  ?Neck:  ?   Trachea: Trachea normal.  ?Cardiovascular:  ?   Rate and Rhythm: Normal rate and regular rhythm.  ?   Pulses: Normal pulses.  ?   Heart sounds: S1 normal and S2 normal. Murmur heard.  ?Pulmonary:  ?   Effort: Pulmonary effort is normal.  ?   Breath sounds: Normal breath sounds. No decreased breath sounds, wheezing, rhonchi or rales.  ?Lymphadenopathy:  ?   Cervical: No  cervical adenopathy.  ?Skin: ?   General: Skin is warm and dry.  ?Neurological:  ?   Mental Status: He is alert.  ?   GCS: GCS eye subscore is 4. GCS verbal subscore is 5. GCS motor subscore is 6.  ?Psychiatric:     ?  Speech: Speech normal.     ?   Behavior: Behavior normal. Behavior is cooperative.  ? ? ?Assessment and Plan:  ? ?Acute Sinusitis ?No red flags  ?Start doxycycline 100 mg twice daily  ?Encouraged patient to push more fluids and get plenty of rest  ?Follow up if new/worsening symptoms or concerns occur  ?Low threshold to update blood work or other evaluation if symptoms do not improved ?If any new SOB or CP, patient was advised to go to the ER ? ?I,Havlyn C Ratchford,acting as a scribe for [employer], PA.,have documented all relevant documentation on the behalf of Inda Coke, PA,as directed by  Inda Coke, PA while in the presence of Inda Coke, [place]. ? ?IInda Coke, PA, have reviewed all documentation for this visit. The documentation on 02/17/22 for the exam, diagnosis, procedures, and orders are all accurate and complete. ? ? ?Inda Coke, PA-C ? ?

## 2022-02-23 ENCOUNTER — Ambulatory Visit
Admission: RE | Admit: 2022-02-23 | Discharge: 2022-02-23 | Disposition: A | Discharge status: Home / Self Care | Payer: Self-pay | Source: Ambulatory Visit | Attending: Cardiovascular Disease | Admitting: Cardiovascular Disease

## 2022-02-23 DIAGNOSIS — I350 Nonrheumatic aortic (valve) stenosis: Secondary | ICD-10-CM

## 2022-02-24 ENCOUNTER — Telehealth: Payer: Self-pay | Admitting: Cardiovascular Disease

## 2022-02-24 NOTE — Telephone Encounter (Signed)
Spoke with patient - advised calcium score needs final MD review for advice/recommendations. He is aware he will get a call back when this is available.  ?

## 2022-02-24 NOTE — Telephone Encounter (Signed)
Pt calling regarding CT results. Please advise ?

## 2022-03-02 ENCOUNTER — Ambulatory Visit (INDEPENDENT_AMBULATORY_CARE_PROVIDER_SITE_OTHER): Payer: Medicare PPO | Admitting: Physician Assistant

## 2022-03-02 VITALS — BP 168/89 | HR 76 | Temp 98.5°F | Ht 70.0 in | Wt 252.4 lb

## 2022-03-02 DIAGNOSIS — R0981 Nasal congestion: Secondary | ICD-10-CM

## 2022-03-02 MED ORDER — IPRATROPIUM BROMIDE 0.06 % NA SOLN
2.0000 | Freq: Four times a day (QID) | NASAL | 12 refills | Status: DC
  Filled 2022-12-31: qty 15, 19d supply, fill #0

## 2022-03-02 MED ORDER — AZITHROMYCIN 250 MG PO TABS: ORAL_TABLET | ORAL | 0 refills | Status: AC

## 2022-03-02 NOTE — Patient Instructions (Signed)
It was great to see you! ? ?Start azithromycin antibiotic ?Stop flonase, start atrovent nasal spray ?Let me know if no better or if any worse ? ?I'm going to reach out to cardiology on your behalf, keep me posted ? ?Take care, ? ?Inda Coke PA-C  ?

## 2022-03-02 NOTE — Progress Notes (Signed)
Mitchell Humphrey is a 81 y.o. male here for a follow up of sinusitis. ? ?History of Present Illness:  ? ?Chief Complaint  ?Patient presents with  ? Sinusitis  ?  Pt states he recently took 7 days antibiotic and was starting to clear up and finished medication, need something stronger and a bit longer, nasal drainage down throat, nose dripping, and headache about all the tie from sinus pressure across eyes. Pt also c/o balance being off. Wants to discuss recent CT scan as well  ? ?Mitchell Humphrey presents to today's visit with his spouse, [name].  ? ?Sinusitis ?After previously being seen by me on 02/17/22, pt states he was compliant with taking the doxycyline 100 mg twice daily. Although he took the medication as prescribed for 7 days, he is still experiencing sx. States that he did feel his sx improved while on the medication but once he stopped this, his sx returned and worsened. Reports that he is experiencing PND, rhinorrhea, ear popping sensation,and headache from sinus pressure across eyes. In an effort to manage his sx, he has been using flonase nasal spray but this has provided minor relief.  At this time he believes he needs a stronger medication for a longer period of time. Denies fever, chills, or sore throat.  ? ?Lightheadedness; Fatigue  ?As discussed in our previous visit, pt is still experiencing moments of lightheadedness and increased fatigue. He describes these lightheaded moments as though he is off balance and spinning around the room. He does admit that there have been times that due to this he has almost fallen. Pt believes he is drinking more than enough fluids and getting plenty of rest since he is retired. In regards to his fatigue, he has found he is not able to complete certain things such as his regular walking route due to being so physically tired. On 02/23/22, he did have his CT Calcium scan completed which showed his aortic valve score to be 2428 and total calcium score as 1599. He is set to follow  up with cardiology about this issue and is not interested in further evaluation on my end.  ? ?Denies chest pain, did not some increased SOB yesterday. ? ?Past Medical History:  ?Diagnosis Date  ? Allergy   ? Arthritis   ? BPH (benign prostatic hyperplasia)   ? Cataract   ? Dyslipidemia   ? Erectile dysfunction   ? GERD (gastroesophageal reflux disease)   ? Hx of adenomatous colonic polyps   ? Hyperlipidemia   ? Hypertension   ? Hypogonadism male   ? Hypothyroid   ? Obesity   ? ?  ?Social History  ? ?Tobacco Use  ? Smoking status: Never  ? Smokeless tobacco: Never  ? Tobacco comments:  ?  He chews on a cigar  ?Vaping Use  ? Vaping Use: Never used  ?Substance Use Topics  ? Alcohol use: No  ? Drug use: No  ? ? ?Past Surgical History:  ?Procedure Laterality Date  ? COLONOSCOPY  08/2008  ? Dr. Watt Climes  ? HERNIA REPAIR  1970  ? [place]  ? ? ?Family History  ?Problem Relation Age of Onset  ? Breast cancer Mother   ? Lung cancer Father   ? Brain cancer Father   ? Breast cancer Sister   ? Heart attack Sister   ? ? ?Allergies  ?Allergen Reactions  ? Penicillins   ? ? ?Current Medications:  ? ?Current Outpatient Medications:  ?  aspirin EC 81 MG  tablet, Take 81 mg by mouth daily. Swallow whole., Disp: , Rfl:  ?  atorvastatin (LIPITOR) 20 MG tablet, Take 1 tablet (20 mg total) by mouth daily., Disp: 90 tablet, Rfl: 1 ?  Cholecalciferol (VITAMIN D3) 125 MCG (5000 UT) CAPS, Take 1 capsule by mouth daily in the afternoon., Disp: , Rfl:  ?  doxycycline (VIBRA-TABS) 100 MG tablet, Take 1 tablet (100 mg total) by mouth 2 (two) times daily., Disp: 14 tablet, Rfl: 0 ?  enalapril (VASOTEC) 10 MG tablet, Take 1 tablet (10 mg total) by mouth daily., Disp: 90 tablet, Rfl: 1 ?  finasteride (PROSCAR) 5 MG tablet, Take 1 tablet (5 mg total) by mouth daily., Disp: 90 tablet, Rfl: 1 ?  fluticasone (FLONASE) 50 MCG/ACT nasal spray, Place 2 sprays into both nostrils daily., Disp: 16 g, Rfl: 2 ?  hydrochlorothiazide (MICROZIDE) 12.5 MG capsule, Take  1 capsule (12.5 mg total) by mouth daily., Disp: 90 capsule, Rfl: 1 ?  levothyroxine (SYNTHROID) 150 MCG tablet, Take 1 tablet po each morning prior to breakfast, Disp: 90 tablet, Rfl: 1 ?  loratadine (CLARITIN) 10 MG tablet, Take 10 mg by mouth daily., Disp: , Rfl:  ?  meloxicam (MOBIC) 15 MG tablet, Take 1 tablet (15 mg total) by mouth daily., Disp: 90 tablet, Rfl: 1 ?  OVER THE COUNTER MEDICATION, Urinozinc Prostate Plus, Disp: , Rfl:  ?  OVER THE COUNTER MEDICATION, 400 mg daily. Mucus Relief, Disp: , Rfl:  ?  OVER THE COUNTER MEDICATION, 1,000 mg. GRACINIA, Disp: , Rfl:  ?  OVER THE COUNTER MEDICATION, Vita Fruits + Veggies, Disp: , Rfl:  ?  OVER THE COUNTER MEDICATION, NITR Immune Protect, Disp: , Rfl:  ?  Saw Palmetto 1000 MG CAPS, Take by mouth. Take 2 capsules in the MORNING., Disp: , Rfl:  ?  tamsulosin (FLOMAX) 0.4 MG CAPS capsule, Take 1 capsule (0.4 mg total) by mouth daily., Disp: 90 capsule, Rfl: 1 ?  vitamin C (ASCORBIC ACID) 500 MG tablet, Take 500 mg by mouth daily., Disp: , Rfl:   ? ?Review of Systems:  ? ?ROS ?Negative unless otherwise specified per HPI. ? ?Vitals:  ? ?Vitals:  ? 03/02/22 1352  ?BP: (!) 168/89  ?Pulse: 76  ?Temp: 98.5 ?F (36.9 ?C)  ?TempSrc: Temporal  ?SpO2: 98%  ?Weight: 252 lb 6.4 oz (114.5 kg)  ?Height: '5\' 10"'$  (1.778 m)  ?   ?Body mass index is 36.22 kg/m?. ? ?Physical Exam:  ? ?Physical Exam ?Vitals and nursing note reviewed.  ?Constitutional:   ?   General: He is not in acute distress. ?   Appearance: He is well-developed. He is not ill-appearing or toxic-appearing.  ?Cardiovascular:  ?   Rate and Rhythm: Normal rate and regular rhythm.  ?   Pulses: Normal pulses.  ?   Heart sounds: Normal heart sounds, S1 normal and S2 normal.  ?Pulmonary:  ?   Effort: Pulmonary effort is normal.  ?   Breath sounds: Normal breath sounds.  ?Skin: ?   General: Skin is warm and dry.  ?Neurological:  ?   Mental Status: He is alert.  ?   GCS: GCS eye subscore is 4. GCS verbal subscore is 5. GCS  motor subscore is 6.  ?Psychiatric:     ?   Speech: Speech normal.     ?   Behavior: Behavior normal. Behavior is cooperative.  ? ? ?Assessment and Plan:  ? ?Sinus congestion ?Uncontrolled ?Stop flonase nasal spray and start Atrovent nasal  spray  ?Start azithromycin 250 mg daily, 2 tablets on day one then 1 tablet on days 2-5 ?Encouraged patient to push more fluids and get plenty of rest  ?Follow up if new/worsening symptoms or concerns occur -- likely will obtain CT scan for further evaluation and mgmt ? ?Lightheadedness/Fatigue ?Patient is concerned about his Aortic Valve ?He has an appointment with cardiology in June, he is asking me to reach out to them to see if they can schedule sooner ?Denies any further needs from me, however I did discuss with him after cardiac issues resolve, I would like to get him into PT ? ?I,Havlyn C Ratchford,acting as a scribe for [employer], PA.,have documented all relevant documentation on the behalf of Inda Coke, PA,as directed by  Inda Coke, PA while in the presence of Inda Coke, [place]. ? ?IInda Coke, PA, have reviewed all documentation for this visit. The documentation on 03/02/22 for the exam, diagnosis, procedures, and orders are all accurate and complete. ? ? ?Inda Coke, PA-C ? ?

## 2022-03-02 NOTE — Progress Notes (Signed)
?Cardiology Office Note:   ?Date:  03/04/2022  ?NAME:  RONDALE Humphrey    ?MRN: [id number] ?DOB:  Apr 27, 1941  ? ?PCP:  Inda Coke, PA  ?Cardiologist:  None  ?Electrophysiologist:  None  ? ?Referring MD: Inda Coke, PA  ? ?Chief Complaint  ?Patient presents with  ? Follow-up  ?   ?  ? ? ?History of Present Illness:   ?Mitchell Humphrey is a 81 y.o. male with a hx of aortic stenosis who presents for follow-up.  He reports over the last several weeks has become more short of breath with exertion.  We did order a CT scan which shows that his aortic stenosis is likely severe.  He has had rather rapid progression of his aortic valve stenosis in the past year.  Murmur is late peaking and harsh.  He reports no chest pain but does describe intermittent numbness and tingling in his arm.  He does have an elevated calcium score as well.  Reports no exertional chest pain or pressure.  He has not been able to be as active as he used to be.  Blood pressure is well controlled.  EKG demonstrates sinus rhythm with no acute ischemic changes.  We did discuss that it is likely time for him to be evaluated for his aortic valve.  I believe this is severe aortic stenosis. ? ?Problem List ?1. DVT ?-L gastrocnemius vein 12/17/2019 ?-in setting of Covid PNA ?2. Moderate AS ?-V max 2.8 m/s, MG 22 mmHG, AVA 1.38 cm2, DI 0.33 (12/26/2020) ?-Vmax 3.5 m/s, MG 27 mm2, AVA 1.4, DI 0.22 ?3. HTN ?4. HLD ?-T chol 135, HDL 37, LDL 61, TG 181 ?5.  CAD ?-calcium score 1599 (81st percentile) ?6. DM ?-A1c 6.4 ? ?Past Medical History: ?Past Medical History:  ?Diagnosis Date  ? Allergy   ? Arthritis   ? BPH (benign prostatic hyperplasia)   ? Cataract   ? Dyslipidemia   ? Erectile dysfunction   ? GERD (gastroesophageal reflux disease)   ? Hx of adenomatous colonic polyps   ? Hyperlipidemia   ? Hypertension   ? Hypogonadism male   ? Hypothyroid   ? Obesity   ? ? ?Past Surgical History: ?Past Surgical History:  ?Procedure Laterality Date  ? COLONOSCOPY  08/2008   ? Dr. Watt Climes  ? HERNIA REPAIR  1970  ? [place]  ? ? ?Current Medications: ?Current Meds  ?Medication Sig  ? aspirin EC 81 MG tablet Take 81 mg by mouth daily. Swallow whole.  ? atorvastatin (LIPITOR) 20 MG tablet Take 1 tablet (20 mg total) by mouth daily.  ? azithromycin (ZITHROMAX) 250 MG tablet Take 2 tablets on day 1, then 1 tablet daily on days 2 through 5  ? Cholecalciferol (VITAMIN D3) 125 MCG (5000 UT) CAPS Take 1 capsule by mouth daily in the afternoon.  ? doxycycline (VIBRA-TABS) 100 MG tablet Take 1 tablet (100 mg total) by mouth 2 (two) times daily.  ? enalapril (VASOTEC) 10 MG tablet Take 1 tablet (10 mg total) by mouth daily.  ? finasteride (PROSCAR) 5 MG tablet Take 1 tablet (5 mg total) by mouth daily.  ? hydrochlorothiazide (MICROZIDE) 12.5 MG capsule Take 1 capsule (12.5 mg total) by mouth daily.  ? ipratropium (ATROVENT) 0.06 % nasal spray Place 2 sprays into both nostrils 4 (four) times daily.  ? levothyroxine (SYNTHROID) 150 MCG tablet Take 1 tablet po each morning prior to breakfast  ? loratadine (CLARITIN) 10 MG tablet Take 10 mg by mouth daily.  ?  meloxicam (MOBIC) 15 MG tablet Take 1 tablet (15 mg total) by mouth daily.  ? OVER THE COUNTER MEDICATION Urinozinc Prostate Plus  ? OVER THE COUNTER MEDICATION 400 mg daily. Mucus Relief  ? OVER THE COUNTER MEDICATION 1,000 mg. GRACINIA  ? OVER THE COUNTER MEDICATION Vita Fruits + Veggies  ? OVER THE COUNTER MEDICATION NITR Immune Protect  ? Saw Palmetto 1000 MG CAPS Take by mouth. Take 2 capsules in the MORNING.  ? tamsulosin (FLOMAX) 0.4 MG CAPS capsule Take 1 capsule (0.4 mg total) by mouth daily.  ? vitamin C (ASCORBIC ACID) 500 MG tablet Take 500 mg by mouth daily.  ?  ? ?Allergies:    ?Penicillins  ? ?Social History: ?Social History  ? ?Socioeconomic History  ? Marital status: Married  ?  Spouse name: Not on file  ? Number of children: 1  ? Years of education: Not on file  ? Highest education level: Not on file  ?Occupational History  ?  Occupation: Scientist, research (medical)  ?Tobacco Use  ? Smoking status: Never  ? Smokeless tobacco: Never  ? Tobacco comments:  ?  He chews on a cigar  ?Vaping Use  ? Vaping Use: Never used  ?Substance and Sexual Activity  ? Alcohol use: No  ? Drug use: No  ? Sexual activity: Not Currently  ?Other Topics Concern  ? Not on file  ?Social History Narrative  ? Superintendent for construction site  ? Lives with wife  ? 2 step children  ? 2 sons  ? ?Social Determinants of Health  ? ?Financial Resource Strain: Not on file  ?Food Insecurity: Not on file  ?Transportation Needs: Not on file  ?Physical Activity: Not on file  ?Stress: Not on file  ?Social Connections: Not on file  ?  ? ?Family History: ?The patient's family history includes Brain cancer in his father; Breast cancer in his mother and sister; Heart attack in his sister; Lung cancer in his father. ? ?ROS:   ?All other ROS reviewed and negative. Pertinent positives noted in the HPI.    ? ?EKGs/Labs/Other Studies Reviewed:   ?The following studies were personally reviewed by me today: ? ?EKG:  EKG is ordered today.  The ekg ordered today demonstrates normal sinus rhythm heart 73, no acute ischemic changes, and was personally reviewed by me.  ? ?CT Calcium Score 02/23/2022 ?IMPRESSION: ?1. Coronary calcium score of 1599. This was 81st percentile for ?age-, race-, and sex-matched controls. ?  ?2. Severe aortic stenosis (Calcium score 2428). ? ?Recent Labs: ?07/14/2021: ALT 18 ?01/07/2022: BUN 12; Creatinine, Ser 0.71; Hemoglobin 13.4; Platelets 249; Potassium 4.7; Sodium 141; TSH 0.514  ? ?Recent Lipid Panel ?   ?Component Value Date/Time  ? CHOL 135 07/14/2021 1113  ? TRIG 181.0 (H) 07/14/2021 1113  ? HDL 37.30 (L) 07/14/2021 1113  ? CHOLHDL 4 07/14/2021 1113  ? VLDL 36.2 07/14/2021 1113  ? [place] 61 07/14/2021 1113  ? ? ?Physical Exam:   ?VS:  BP 118/82 (BP Location: Left Arm, Patient Position: Sitting, Cuff Size: Large)   Pulse 73   Ht '5\' 10"'$  (1.778 m)   Wt 249 lb  12.8 oz (113.3 kg)   SpO2 94%   BMI 35.84 kg/m?    ?Wt Readings from Last 3 Encounters:  ?03/04/22 249 lb 12.8 oz (113.3 kg)  ?03/02/22 252 lb 6.4 oz (114.5 kg)  ?02/17/22 249 lb 9.6 oz (113.2 kg)  ?  ?General: Well nourished, well developed, in no acute distress ?Head: Atraumatic,  normal size  ?Eyes: PEERLA, EOMI  ?Neck: Supple, no JVD ?Endocrine: No thryomegaly ?Cardiac: Normal S1, S2; RRR; 3/6 SEM  ?Lungs: Clear to auscultation bilaterally, no wheezing, rhonchi or rales  ?Abd: Soft, nontender, no hepatomegaly  ?Ext: No edema, pulses 2+ ?Musculoskeletal: No deformities, BUE and BLE strength normal and equal ?Skin: Warm and dry, no rashes   ?Neuro: Alert and oriented to person, place, time, and situation, CNII-XII grossly intact, no focal deficits  ?Psych: Normal mood and affect  ? ?ASSESSMENT:   ?JERMALE CRASS is a 81 y.o. male who presents for the following: ?1. Nonrheumatic aortic valve stenosis   ?2. Primary hypertension   ?3. Agatston coronary artery calcium score greater than 400   ?4. Mixed hyperlipidemia   ? ?PLAN:   ?1. Nonrheumatic aortic valve stenosis ?-Aortic valve stenosis has progressed.  Murmur is consistent with severe arctic stenosis.  Aortic valve calcium score also consistent with this diagnosis.  He is now having symptoms of shortness of breath.  I believe he needs evaluation for TAVR.  We will set him up with structural heart team. ?-His V-max and mean gradient have increased rather rapidly over the past year.  Dimensionless index consistent with severe arctic stenosis.  Calcium score also consistent with this diagnosis.  Furthermore clinically his murmur is consistent with severe arctic stenosis. ? ?2. Primary hypertension ?-Well-controlled.  No change. ? ?3. Agatston coronary artery calcium score greater than 400 ?4. Mixed hyperlipidemia ?-Very elevated calcium score.  Will undergo left and right heart catheterization as part of work-up for TAVR.  He should continue aspirin 81 mg daily.   Also on Lipitor 20 mg daily.  Most recent LDL cholesterol is at goal. ? ?Disposition: Return in about 6 months (around 09/03/2022). ? ?Medication Adjustments/Labs and Tests Ordered: ?Current medicines are reviewed

## 2022-03-04 ENCOUNTER — Ambulatory Visit: Payer: Medicare PPO | Admitting: Cardiovascular Disease

## 2022-03-04 ENCOUNTER — Encounter: Payer: Self-pay | Admitting: Cardiovascular Disease

## 2022-03-04 VITALS — BP 118/82 | HR 73 | Ht 70.0 in | Wt 249.8 lb

## 2022-03-04 DIAGNOSIS — I10 Essential (primary) hypertension: Secondary | ICD-10-CM

## 2022-03-04 DIAGNOSIS — E782 Mixed hyperlipidemia: Secondary | ICD-10-CM | POA: Diagnosis not present

## 2022-03-04 DIAGNOSIS — R931 Abnormal findings on diagnostic imaging of heart and coronary circulation: Secondary | ICD-10-CM

## 2022-03-04 DIAGNOSIS — I350 Nonrheumatic aortic (valve) stenosis: Secondary | ICD-10-CM | POA: Diagnosis not present

## 2022-03-04 NOTE — Patient Instructions (Addendum)
Medication Instructions:  ?The current medical regimen is effective;  continue present plan and medications. ? ?*If you need a refill on your cardiac medications before your next appointment, please call your pharmacy* ? ? ?Follow-Up: ?At [facility], you and your health needs are our priority.  As part of our continuing mission to provide you with exceptional heart care, we have created designated Provider Care Teams.  These Care Teams include your primary Cardiologist (physician) and Advanced Practice Providers (APPs -  Physician Assistants and Nurse Practitioners) who all work together to provide you with the care you need, when you need it. ? ?We recommend signing up for the patient portal called "MyChart".  Sign up information is provided on this After Visit Summary.  MyChart is used to connect with patients for Virtual Visits (Telemedicine).  Patients are able to view lab/test results, encounter notes, upcoming appointments, etc.  Non-urgent messages can be sent to your provider as well.   ?To learn more about what you can do with MyChart, go to [email or web address].   ? ?Your next appointment:   ?6 month(s) ? ?The format for your next appointment:   ?In Person ? ?Provider:   ?Eleonore Chiquito, MD  ? ? ?Other Instructions ?Referral to our Structural heart clinic- they will contact you for an appointment. ? ?Important Information About Sugar ? ? ? ? ? ? ?

## 2022-03-05 NOTE — Progress Notes (Signed)
? ?Structural Heart Clinic Consult Note ? ?Chief Complaint  ?Patient presents with  ? New Patient (Initial Visit)  ?  Severe aortic stenosis  ? ?History of Present Illness: 81 yo male with history of arthritis, BPH, hyperlipidemia, GERD, HTN, hypothyroidism and severe aortic stenosis who is here today as a new consult, referred by Dr. Audie Box, for further discussion of his aortic stenosis and possible TAVR. He has been followed for moderate aortic stenosis. Recent worsened dyspnea. Echo 12/26/21 with LVEF=60-65%. Normal RV function. Mild mitral regurgitation. The aortic valve is thickened and calcified with limited leaflet excursion. Mean gradient 26.5 mmHg, peak gradient 46 mmHg, AVA 1.2 cm2, DI 0.20, SVI 45. CT cardiac calcium scoring with AV calcium score of 2428 and evidence of coronary artery calcification. He has been recently evaluated by Dr. Audie Box and is felt to have severe aortic stenosis.  ? ?He tells me today that he has had progressive dyspnea on exertion and fatigue. He has mild left chest wall pain at rest. He has had some pain in his left arm with ambulation. No LE edema. Occasional dizziness but no near syncope. He has had regular dental care up until two years ago. No active dental issues. He lives in [place] with his wife. He is retired from Architect work.  ? ?Primary Care Physician: Inda Coke, PA ?Primary Cardiologist: O'Neal ?Referring Cardiologist: O'Neal ? ?Past Medical History:  ?Diagnosis Date  ? Allergy   ? Arthritis   ? BPH (benign prostatic hyperplasia)   ? Cataract   ? Dyslipidemia   ? Erectile dysfunction   ? GERD (gastroesophageal reflux disease)   ? Hx of adenomatous colonic polyps   ? Hyperlipidemia   ? Hypertension   ? Hypogonadism male   ? Hypothyroid   ? Obesity   ? ? ?Past Surgical History:  ?Procedure Laterality Date  ? COLONOSCOPY  08/2008  ? Dr. Watt Climes  ? HERNIA REPAIR  1970  ? [facility]  ? ? ?Current Outpatient Medications  ?Medication Sig Dispense Refill  ? aspirin  EC 81 MG tablet Take 81 mg by mouth daily. Swallow whole.    ? atorvastatin (LIPITOR) 20 MG tablet Take 1 tablet (20 mg total) by mouth daily. 90 tablet 1  ? azithromycin (ZITHROMAX) 250 MG tablet Take 2 tablets on day 1, then 1 tablet daily on days 2 through 5 6 tablet 0  ? Cholecalciferol (VITAMIN D3) 125 MCG (5000 UT) CAPS Take 1 capsule by mouth daily in the afternoon.    ? doxycycline (VIBRA-TABS) 100 MG tablet Take 1 tablet (100 mg total) by mouth 2 (two) times daily. 14 tablet 0  ? enalapril (VASOTEC) 10 MG tablet Take 1 tablet (10 mg total) by mouth daily. 90 tablet 1  ? finasteride (PROSCAR) 5 MG tablet Take 1 tablet (5 mg total) by mouth daily. 90 tablet 1  ? fluticasone (FLONASE) 50 MCG/ACT nasal spray Place 2 sprays into both nostrils daily. 16 g 2  ? hydrochlorothiazide (MICROZIDE) 12.5 MG capsule Take 1 capsule (12.5 mg total) by mouth daily. 90 capsule 1  ? ipratropium (ATROVENT) 0.06 % nasal spray Place 2 sprays into both nostrils 4 (four) times daily. 15 mL 12  ? levothyroxine (SYNTHROID) 150 MCG tablet Take 1 tablet po each morning prior to breakfast 90 tablet 1  ? loratadine (CLARITIN) 10 MG tablet Take 10 mg by mouth daily.    ? meloxicam (MOBIC) 15 MG tablet Take 1 tablet (15 mg total) by mouth daily. 90 tablet 1  ?  Saw Palmetto 1000 MG CAPS Take by mouth. Take 2 capsules in the MORNING.    ? tamsulosin (FLOMAX) 0.4 MG CAPS capsule Take 1 capsule (0.4 mg total) by mouth daily. 90 capsule 1  ? vitamin C (ASCORBIC ACID) 500 MG tablet Take 500 mg by mouth daily.    ? GARCINIA CAMBOGIA-CHROMIUM PO Take by mouth.    ? guaiFENesin (GOODSENSE MUCUS RELIEF PO) Take 400 mg by mouth.    ? Misc Natural Products (PRO NUTRIENTS FRUIT & VEGGIE PO) Take by mouth. FeelGood Superfoods Feel Good Vita Fruits & Veggies    ? Misc Natural Products (PROSTATE) CAPS Take by mouth. Urinozinc Prostate Formula    ? Multiple Vitamins-Minerals (IMMUNE SUPPORT PO) Take by mouth. NHR Immune Protect    ? ?No current  facility-administered medications for this visit.  ? ? ?Allergies  ?Allergen Reactions  ? Penicillins   ? ? ?Social History  ? ?Socioeconomic History  ? Marital status: Married  ?  Spouse name: Not on file  ? Number of children: 1  ? Years of education: Not on file  ? Highest education level: Not on file  ?Occupational History  ? Occupation: Scientist, research (medical)  ? Occupation: Retired-Construction  ?Tobacco Use  ? Smoking status: Never  ? Smokeless tobacco: Never  ? Tobacco comments:  ?  He chews on a cigar  ?Vaping Use  ? Vaping Use: Never used  ?Substance and Sexual Activity  ? Alcohol use: No  ? Drug use: No  ? Sexual activity: Not Currently  ?Other Topics Concern  ? Not on file  ?Social History Narrative  ? Superintendent for construction site  ? Lives with wife  ? 2 step children  ? 2 sons  ? ?Social Determinants of Health  ? ?Financial Resource Strain: Not on file  ?Food Insecurity: Not on file  ?Transportation Needs: Not on file  ?Physical Activity: Not on file  ?Stress: Not on file  ?Social Connections: Not on file  ?Intimate Partner Violence: Not on file  ? ? ?Family History  ?Problem Relation Age of Onset  ? Breast cancer Mother   ? Lung cancer Father   ? Brain cancer Father   ? Breast cancer Sister   ? Heart attack Sister   ? ? ?Review of Systems:  As stated in the HPI and otherwise negative.  ? ?BP (!) 144/82   Pulse 83   Ht '5\' 10"'$  (1.778 m)   Wt 247 lb (112 kg)   SpO2 95%   BMI 35.44 kg/m?  ? ?Physical Examination: ?General: Well developed, well nourished, NAD  ?HEENT: OP clear, mucus membranes moist  ?SKIN: warm, dry. No rashes. ?Neuro: No focal deficits  ?Musculoskeletal: Muscle strength 5/5 all ext  ?Psychiatric: Mood and affect normal  ?Neck: No JVD, no carotid bruits, no thyromegaly, no lymphadenopathy.  ?Lungs:Clear bilaterally, no wheezes, rhonci, crackles ?Cardiovascular: Regular rate and rhythm. Loud, harsh, late peaking systolic murmur.  ?Abdomen:Soft. Bowel sounds present. Non-tender.   ?Extremities: No lower extremity edema. Pulses are 2 + in the bilateral DP/PT. ? ?EKG:  EKG is not ordered today. ?The ekg ordered today demonstrates  ? ?Echo 12/26/21: ? 1. Left ventricular ejection fraction, by estimation, is 60 to 65%. The  ?left ventricle has normal function. The left ventricle has no regional  ?wall motion abnormalities. Left ventricular diastolic parameters were  ?normal.  ? 2. Right ventricular systolic function is normal. The right ventricular  ?size is normal. Tricuspid regurgitation signal is inadequate for assessing  ?  PA pressure.  ? 3. Left atrial size was moderately dilated.  ? 4. The mitral valve is normal in structure. Mild mitral valve  ?regurgitation. No evidence of mitral stenosis.  ? 5. The aortic valve was not well visualized. There is severe calcifcation  ?of the aortic valve. Aortic valve regurgitation is not visualized.  ?Moderate to severe aortic valve stenosis. Vmax 3.5 m/s, MG 28mHg, AVA 1.4  ?cm^2, DI 0.22  ? ?Comparison(s): 12/26/20 EF 60-65%. Moderate AS 281mg mean PG, 3290m peak  ?PG.  ? ?FINDINGS  ? Left Ventricle: Left ventricular ejection fraction, by estimation, is 60  ?to 65%. The left ventricle has normal function. The left ventricle has no  ?regional wall motion abnormalities. The left ventricular internal cavity  ?size was normal in size. There is  ? no left ventricular hypertrophy. Left ventricular diastolic parameters  ?were normal.  ? ?Right Ventricle: The right ventricular size is normal. No increase in  ?right ventricular wall thickness. Right ventricular systolic function is  ?normal. Tricuspid regurgitation signal is inadequate for assessing PA  ?pressure.  ? ?Left Atrium: Left atrial size was moderately dilated.  ? ?Right Atrium: Right atrial size was normal in size.  ? ?Pericardium: Trivial pericardial effusion is present.  ? ?Mitral Valve: The mitral valve is normal in structure. Mild mitral valve  ?regurgitation. No evidence of mitral valve  stenosis.  ? ?Tricuspid Valve: The tricuspid valve is normal in structure. Tricuspid  ?valve regurgitation is not demonstrated.  ? ?Aortic Valve: The aortic valve was not well visualized. There is severe  ?calcifcation of t

## 2022-03-05 NOTE — H&P (View-Only) (Signed)
? ?Structural Heart Clinic Consult Note ? ?Chief Complaint  ?Patient presents with  ? New Patient (Initial Visit)  ?  Severe aortic stenosis  ? ?History of Present Illness: 81 yo male with history of arthritis, BPH, hyperlipidemia, GERD, HTN, hypothyroidism and severe aortic stenosis who is here today as a new consult, referred by Dr. Audie Box, for further discussion of his aortic stenosis and possible TAVR. He has been followed for moderate aortic stenosis. Recent worsened dyspnea. Echo 12/26/21 with LVEF=60-65%. Normal RV function. Mild mitral regurgitation. The aortic valve is thickened and calcified with limited leaflet excursion. Mean gradient 26.5 mmHg, peak gradient 46 mmHg, AVA 1.2 cm2, DI 0.20, SVI 45. CT cardiac calcium scoring with AV calcium score of 2428 and evidence of coronary artery calcification. He has been recently evaluated by Dr. Audie Box and is felt to have severe aortic stenosis.  ? ?He tells me today that he has had progressive dyspnea on exertion and fatigue. He has mild left chest wall pain at rest. He has had some pain in his left arm with ambulation. No LE edema. Occasional dizziness but no near syncope. He has had regular dental care up until two years ago. No active dental issues. He lives in [place] with his wife. He is retired from Architect work.  ? ?Primary Care Physician: Inda Coke, PA ?Primary Cardiologist: O'Neal ?Referring Cardiologist: O'Neal ? ?Past Medical History:  ?Diagnosis Date  ? Allergy   ? Arthritis   ? BPH (benign prostatic hyperplasia)   ? Cataract   ? Dyslipidemia   ? Erectile dysfunction   ? GERD (gastroesophageal reflux disease)   ? Hx of adenomatous colonic polyps   ? Hyperlipidemia   ? Hypertension   ? Hypogonadism male   ? Hypothyroid   ? Obesity   ? ? ?Past Surgical History:  ?Procedure Laterality Date  ? COLONOSCOPY  08/2008  ? Dr. Watt Climes  ? HERNIA REPAIR  1970  ? [facility]  ? ? ?Current Outpatient Medications  ?Medication Sig Dispense Refill  ? aspirin  EC 81 MG tablet Take 81 mg by mouth daily. Swallow whole.    ? atorvastatin (LIPITOR) 20 MG tablet Take 1 tablet (20 mg total) by mouth daily. 90 tablet 1  ? azithromycin (ZITHROMAX) 250 MG tablet Take 2 tablets on day 1, then 1 tablet daily on days 2 through 5 6 tablet 0  ? Cholecalciferol (VITAMIN D3) 125 MCG (5000 UT) CAPS Take 1 capsule by mouth daily in the afternoon.    ? doxycycline (VIBRA-TABS) 100 MG tablet Take 1 tablet (100 mg total) by mouth 2 (two) times daily. 14 tablet 0  ? enalapril (VASOTEC) 10 MG tablet Take 1 tablet (10 mg total) by mouth daily. 90 tablet 1  ? finasteride (PROSCAR) 5 MG tablet Take 1 tablet (5 mg total) by mouth daily. 90 tablet 1  ? fluticasone (FLONASE) 50 MCG/ACT nasal spray Place 2 sprays into both nostrils daily. 16 g 2  ? hydrochlorothiazide (MICROZIDE) 12.5 MG capsule Take 1 capsule (12.5 mg total) by mouth daily. 90 capsule 1  ? ipratropium (ATROVENT) 0.06 % nasal spray Place 2 sprays into both nostrils 4 (four) times daily. 15 mL 12  ? levothyroxine (SYNTHROID) 150 MCG tablet Take 1 tablet po each morning prior to breakfast 90 tablet 1  ? loratadine (CLARITIN) 10 MG tablet Take 10 mg by mouth daily.    ? meloxicam (MOBIC) 15 MG tablet Take 1 tablet (15 mg total) by mouth daily. 90 tablet 1  ?  Saw Palmetto 1000 MG CAPS Take by mouth. Take 2 capsules in the MORNING.    ? tamsulosin (FLOMAX) 0.4 MG CAPS capsule Take 1 capsule (0.4 mg total) by mouth daily. 90 capsule 1  ? vitamin C (ASCORBIC ACID) 500 MG tablet Take 500 mg by mouth daily.    ? GARCINIA CAMBOGIA-CHROMIUM PO Take by mouth.    ? guaiFENesin (GOODSENSE MUCUS RELIEF PO) Take 400 mg by mouth.    ? Misc Natural Products (PRO NUTRIENTS FRUIT & VEGGIE PO) Take by mouth. FeelGood Superfoods Feel Good Vita Fruits & Veggies    ? Misc Natural Products (PROSTATE) CAPS Take by mouth. Urinozinc Prostate Formula    ? Multiple Vitamins-Minerals (IMMUNE SUPPORT PO) Take by mouth. NHR Immune Protect    ? ?No current  facility-administered medications for this visit.  ? ? ?Allergies  ?Allergen Reactions  ? Penicillins   ? ? ?Social History  ? ?Socioeconomic History  ? Marital status: Married  ?  Spouse name: Not on file  ? Number of children: 1  ? Years of education: Not on file  ? Highest education level: Not on file  ?Occupational History  ? Occupation: Scientist, research (medical)  ? Occupation: Retired-Construction  ?Tobacco Use  ? Smoking status: Never  ? Smokeless tobacco: Never  ? Tobacco comments:  ?  He chews on a cigar  ?Vaping Use  ? Vaping Use: Never used  ?Substance and Sexual Activity  ? Alcohol use: No  ? Drug use: No  ? Sexual activity: Not Currently  ?Other Topics Concern  ? Not on file  ?Social History Narrative  ? Superintendent for construction site  ? Lives with wife  ? 2 step children  ? 2 sons  ? ?Social Determinants of Health  ? ?Financial Resource Strain: Not on file  ?Food Insecurity: Not on file  ?Transportation Needs: Not on file  ?Physical Activity: Not on file  ?Stress: Not on file  ?Social Connections: Not on file  ?Intimate Partner Violence: Not on file  ? ? ?Family History  ?Problem Relation Age of Onset  ? Breast cancer Mother   ? Lung cancer Father   ? Brain cancer Father   ? Breast cancer Sister   ? Heart attack Sister   ? ? ?Review of Systems:  As stated in the HPI and otherwise negative.  ? ?BP (!) 144/82   Pulse 83   Ht '5\' 10"'$  (1.778 m)   Wt 247 lb (112 kg)   SpO2 95%   BMI 35.44 kg/m?  ? ?Physical Examination: ?General: Well developed, well nourished, NAD  ?HEENT: OP clear, mucus membranes moist  ?SKIN: warm, dry. No rashes. ?Neuro: No focal deficits  ?Musculoskeletal: Muscle strength 5/5 all ext  ?Psychiatric: Mood and affect normal  ?Neck: No JVD, no carotid bruits, no thyromegaly, no lymphadenopathy.  ?Lungs:Clear bilaterally, no wheezes, rhonci, crackles ?Cardiovascular: Regular rate and rhythm. Loud, harsh, late peaking systolic murmur.  ?Abdomen:Soft. Bowel sounds present. Non-tender.   ?Extremities: No lower extremity edema. Pulses are 2 + in the bilateral DP/PT. ? ?EKG:  EKG is not ordered today. ?The ekg ordered today demonstrates  ? ?Echo 12/26/21: ? 1. Left ventricular ejection fraction, by estimation, is 60 to 65%. The  ?left ventricle has normal function. The left ventricle has no regional  ?wall motion abnormalities. Left ventricular diastolic parameters were  ?normal.  ? 2. Right ventricular systolic function is normal. The right ventricular  ?size is normal. Tricuspid regurgitation signal is inadequate for assessing  ?  PA pressure.  ? 3. Left atrial size was moderately dilated.  ? 4. The mitral valve is normal in structure. Mild mitral valve  ?regurgitation. No evidence of mitral stenosis.  ? 5. The aortic valve was not well visualized. There is severe calcifcation  ?of the aortic valve. Aortic valve regurgitation is not visualized.  ?Moderate to severe aortic valve stenosis. Vmax 3.5 m/s, MG 35mHg, AVA 1.4  ?cm^2, DI 0.22  ? ?Comparison(s): 12/26/20 EF 60-65%. Moderate AS 233mg mean PG, 3252m peak  ?PG.  ? ?FINDINGS  ? Left Ventricle: Left ventricular ejection fraction, by estimation, is 60  ?to 65%. The left ventricle has normal function. The left ventricle has no  ?regional wall motion abnormalities. The left ventricular internal cavity  ?size was normal in size. There is  ? no left ventricular hypertrophy. Left ventricular diastolic parameters  ?were normal.  ? ?Right Ventricle: The right ventricular size is normal. No increase in  ?right ventricular wall thickness. Right ventricular systolic function is  ?normal. Tricuspid regurgitation signal is inadequate for assessing PA  ?pressure.  ? ?Left Atrium: Left atrial size was moderately dilated.  ? ?Right Atrium: Right atrial size was normal in size.  ? ?Pericardium: Trivial pericardial effusion is present.  ? ?Mitral Valve: The mitral valve is normal in structure. Mild mitral valve  ?regurgitation. No evidence of mitral valve  stenosis.  ? ?Tricuspid Valve: The tricuspid valve is normal in structure. Tricuspid  ?valve regurgitation is not demonstrated.  ? ?Aortic Valve: The aortic valve was not well visualized. There is severe  ?calcifcation of t

## 2022-03-06 ENCOUNTER — Ambulatory Visit: Payer: Medicare PPO | Admitting: Cardiovascular Disease

## 2022-03-06 ENCOUNTER — Encounter: Payer: Self-pay | Admitting: Cardiovascular Disease

## 2022-03-06 VITALS — BP 144/82 | HR 83 | Ht 70.0 in | Wt 247.0 lb

## 2022-03-06 DIAGNOSIS — I350 Nonrheumatic aortic (valve) stenosis: Secondary | ICD-10-CM | POA: Diagnosis not present

## 2022-03-06 NOTE — Patient Instructions (Signed)
Medication Instructions:  ?Your physician recommends that you continue on your current medications as directed. Please refer to the Current Medication list given to you today. ? ?*If you need a refill on your cardiac medications before your next appointment, please call your pharmacy* ? ? ?Lab Work: ?Bmp, Cbc- Today  ? ?If you have labs (blood work) drawn today and your tests are completely normal, you will receive your results only by: ?MyChart Message (if you have MyChart) OR ?A paper copy in the mail ?If you have any lab test that is abnormal or we need to change your treatment, we will call you to review the results. ? ? ?Testing/Procedures: ?Your physician has requested that you have a cardiac catheterization. Cardiac catheterization is used to diagnose and/or treat various heart conditions. Doctors may recommend this procedure for a number of different reasons. The most common reason is to evaluate chest pain. Chest pain can be a symptom of coronary artery disease (CAD), and cardiac catheterization can show whether plaque is narrowing or blocking your heart?s arteries. This procedure is also used to evaluate the valves, as well as measure the blood flow and oxygen levels in different parts of your heart. For further information please visit [email or web address]. Please follow instruction sheet, as given. ? ? ? ?Follow-Up: ?Follow up as scheduled  ? ? ?Other Instructions ? ?[facility] ?[place] OFFICE ?[address], [address] ?[place] [address] ?Dept: [phone] ?Loc: [phone] ? ?Mitchell Humphrey  03/06/2022 ? ?You are scheduled for a Cardiac Catheterization on Wednesday, April 26 with Dr. Lauree Chandler. ? ?1. Please arrive at the Main Entrance A at [facility]: [place], [place] [address] at 9:30 AM (This time is two hours before your procedure to ensure your preparation). Free valet parking service is  available.  ? ?Special note: Every effort is made to have your procedure done on time. Please understand that emergencies sometimes delay scheduled procedures. ? ?2. Diet: Do not eat solid foods after midnight.  You may have clear liquids until 5 AM upon the day of the procedure. ? ?3. Medication instructions in preparation for your procedure: ? ?Hold Hydrochlorothiazide the morning of your procedure  ? ? ?On the morning of your procedure, take Aspirin and any morning medicines NOT listed above.  You may use sips of water. ? ?5. Plan to go home the same day, you will only stay overnight if medically necessary. ?6. You MUST have a responsible adult to drive you home. ?7. An adult MUST be with you the first 24 hours after you arrive home. ?8. Bring a current list of your medications, and the last time and date medication taken. ?9. Bring ID and current insurance cards. ?10.Please wear clothes that are easy to get on and off and wear slip-on shoes. ? ?Thank you for allowing [place] to care for you! ?  -- [facility] ? ? ?Important Information About Sugar ? ? ? ? ?  ?

## 2022-03-06 NOTE — Progress Notes (Addendum)
Pre Surgical Assessment: 5 M Walk Test  58M=16.82f  5 Meter Walk Test- trial 1: 6.35 seconds 5 Meter Walk Test- trial 2: 6.63 seconds 5 Meter Walk Test- trial 3: 6.48 seconds 5 Meter Walk Test Average: 6.48 seconds  -----------------------------  STS score Procedure: Isolated AVR Risk of Mortality: 1.299% Renal Failure: 1.582% Permanent Stroke: 0.970% Prolonged Ventilation: 4.630% DSW Infection: 0.167% Reoperation: 3.372% Morbidity or Mortality: 8.538% Short Length of Stay: 44.537% Long Length of Stay: 3.435%

## 2022-03-10 ENCOUNTER — Telehealth: Payer: Self-pay | Admitting: Physician Assistant

## 2022-03-10 ENCOUNTER — Other Ambulatory Visit: Payer: Self-pay

## 2022-03-10 ENCOUNTER — Telehealth: Payer: Self-pay | Admitting: *Deleted

## 2022-03-10 DIAGNOSIS — I350 Nonrheumatic aortic (valve) stenosis: Secondary | ICD-10-CM

## 2022-03-10 NOTE — Telephone Encounter (Signed)
Spouse needs clarification. ? ?Pt is to see the pulmonary specialist before the 04/26 procedure with cardiology, or is after acceptable? ?

## 2022-03-10 NOTE — Telephone Encounter (Signed)
Cardiac Catheterization scheduled at [facility] for: Wednesday March 11, 2022 11:30 AM ?Arrival time and place: [place] Entrance A at: 9:30 AM ? ? ?No solid food after midnight prior to cath, clear liquids until 5 AM day of procedure. ? ?Medication instructions: ?-Hold: ? HCTZ-AM of procedure ?-Except hold medications usual morning medications can be taken with sips of water including aspirin 81 mg. ? ?Confirmed patient has responsible adult to drive home post procedure and be with patient first 24 hours after arriving home. ? ?Patient reports no new symptoms concerning for COVID-19/no exposure to COVID-19 in the past 10 days. ? ?Reviewed procedure instructions with patient's wife (DPR).  ?

## 2022-03-10 NOTE — Telephone Encounter (Signed)
Spoke to pt's wife [name], told her he does not need to see specialist before procedure just scheduled at your convenience. [name] verbalized understanding. ?

## 2022-03-11 ENCOUNTER — Ambulatory Visit (HOSPITAL_COMMUNITY)
Admission: RE | Admit: 2022-03-11 | Discharge: 2022-03-11 | Disposition: A | Discharge status: Home / Self Care | Payer: Medicare PPO | Attending: Cardiovascular Disease | Admitting: Cardiovascular Disease

## 2022-03-11 ENCOUNTER — Ambulatory Visit: Payer: Medicare PPO | Admitting: Pulmonary Disease

## 2022-03-11 ENCOUNTER — Encounter (HOSPITAL_COMMUNITY)
Admission: RE | Disposition: A | Discharge status: Home / Self Care | Payer: Medicare PPO | Source: Home / Self Care | Attending: Cardiovascular Disease

## 2022-03-11 ENCOUNTER — Other Ambulatory Visit: Payer: Self-pay

## 2022-03-11 DIAGNOSIS — K219 Gastro-esophageal reflux disease without esophagitis: Secondary | ICD-10-CM | POA: Insufficient documentation

## 2022-03-11 DIAGNOSIS — N400 Enlarged prostate without lower urinary tract symptoms: Secondary | ICD-10-CM | POA: Insufficient documentation

## 2022-03-11 DIAGNOSIS — I350 Nonrheumatic aortic (valve) stenosis: Secondary | ICD-10-CM | POA: Insufficient documentation

## 2022-03-11 DIAGNOSIS — I10 Essential (primary) hypertension: Secondary | ICD-10-CM | POA: Insufficient documentation

## 2022-03-11 DIAGNOSIS — I2510 Atherosclerotic heart disease of native coronary artery without angina pectoris: Secondary | ICD-10-CM | POA: Diagnosis not present

## 2022-03-11 DIAGNOSIS — E785 Hyperlipidemia, unspecified: Secondary | ICD-10-CM | POA: Insufficient documentation

## 2022-03-11 DIAGNOSIS — E039 Hypothyroidism, unspecified: Secondary | ICD-10-CM | POA: Diagnosis not present

## 2022-03-11 HISTORY — PX: RIGHT HEART CATH AND CORONARY ANGIOGRAPHY: CATH118264

## 2022-03-11 LAB — BASIC METABOLIC PANEL
BUN/Creatinine Ratio: 21 (ref 10–24)
BUN: 18 mg/dL (ref 8–27)
CO2: 23 mmol/L (ref 20–29)
Calcium: 9.8 mg/dL (ref 8.6–10.2)
Chloride: 103 mmol/L (ref 96–106)
Creatinine, Ser: 0.84 mg/dL (ref 0.76–1.27)
Glucose: 126 mg/dL — ABNORMAL HIGH (ref 70–99)
Potassium: 4.6 mmol/L (ref 3.5–5.2)
Sodium: 141 mmol/L (ref 134–144)
eGFR: 89 mL/min/{1.73_m2} (ref >59)

## 2022-03-11 LAB — POCT I-STAT 7, (LYTES, BLD GAS, ICA,H+H)
Acid-base deficit: 1 mmol/L (ref 0.0–2.0)
Bicarbonate: 25.1 mmol/L (ref 20.0–28.0)
Calcium, Ion: 1.22 mmol/L (ref 1.15–1.40)
HCT: 38 % — ABNORMAL LOW (ref 39.0–52.0)
Hemoglobin: 12.9 g/dL — ABNORMAL LOW (ref 13.0–17.0)
O2 Saturation: 96 %
Potassium: 4.2 mmol/L (ref 3.5–5.1)
Sodium: 140 mmol/L (ref 135–145)
TCO2: 26 mmol/L (ref 22–32)
pCO2 arterial: 47 mmHg (ref 32–48)
pH, Arterial: 7.335 — ABNORMAL LOW (ref 7.35–7.45)
pO2, Arterial: 86 mmHg (ref 83–108)

## 2022-03-11 LAB — POCT I-STAT EG7
Acid-Base Excess: 0 mmol/L (ref 0.0–2.0)
Bicarbonate: 26.9 mmol/L (ref 20.0–28.0)
Calcium, Ion: 1.31 mmol/L (ref 1.15–1.40)
HCT: 40 % (ref 39.0–52.0)
Hemoglobin: 13.6 g/dL (ref 13.0–17.0)
O2 Saturation: 73 %
Potassium: 4.4 mmol/L (ref 3.5–5.1)
Sodium: 139 mmol/L (ref 135–145)
TCO2: 28 mmol/L (ref 22–32)
pCO2, Ven: 51.9 mmHg (ref 44–60)
pH, Ven: 7.322 (ref 7.25–7.43)
pO2, Ven: 43 mmHg (ref 32–45)

## 2022-03-11 LAB — CBC
Hematocrit: 42.2 % (ref 37.5–51.0)
Hemoglobin: 14.3 g/dL (ref 13.0–17.7)
MCH: 27.6 pg (ref 26.6–33.0)
MCHC: 33.9 g/dL (ref 31.5–35.7)
MCV: 82 fL (ref 79–97)
Platelets: 274 10*3/uL (ref 150–450)
RBC: 5.18 x10E6/uL (ref 4.14–5.80)
RDW: 13.5 % (ref 11.6–15.4)
WBC: 9.2 10*3/uL (ref 3.4–10.8)

## 2022-03-11 SURGERY — RIGHT HEART CATH AND CORONARY ANGIOGRAPHY
Anesthesia: LOCAL

## 2022-03-11 MED ORDER — ONDANSETRON HCL 4 MG/2ML IJ SOLN: 4.0000 mg | Freq: Four times a day (QID) | INTRAMUSCULAR | Status: DC | PRN

## 2022-03-11 MED ORDER — SODIUM CHLORIDE 0.9% FLUSH: 3.0000 mL | Freq: Two times a day (BID) | INTRAVENOUS | Status: DC

## 2022-03-11 MED ORDER — HEPARIN SODIUM (PORCINE) 1000 UNIT/ML IJ SOLN
INTRAMUSCULAR | Status: DC | PRN
  Administered 2022-03-11: 5000 [IU] via INTRAVENOUS

## 2022-03-11 MED ORDER — LABETALOL HCL 5 MG/ML IV SOLN
10.0000 mg | INTRAVENOUS | Status: DC | PRN
  Administered 2022-03-11 (×2): 10 mg via INTRAVENOUS

## 2022-03-11 MED ORDER — LIDOCAINE HCL (PF) 1 % IJ SOLN
INTRAMUSCULAR | Status: AC
  Filled 2022-03-11: qty 30

## 2022-03-11 MED ORDER — SODIUM CHLORIDE 0.9 % IV SOLN: 250.0000 mL | INTRAVENOUS | Status: DC | PRN

## 2022-03-11 MED ORDER — HEPARIN SODIUM (PORCINE) 1000 UNIT/ML IJ SOLN
INTRAMUSCULAR | Status: AC
  Filled 2022-03-11: qty 10

## 2022-03-11 MED ORDER — SODIUM CHLORIDE 0.9 % WEIGHT BASED INFUSION
3.0000 mL/kg/h | INTRAVENOUS | Status: AC
  Administered 2022-03-11: 3 mL/kg/h via INTRAVENOUS

## 2022-03-11 MED ORDER — VERAPAMIL HCL 2.5 MG/ML IV SOLN
INTRAVENOUS | Status: AC
  Filled 2022-03-11: qty 2

## 2022-03-11 MED ORDER — IOHEXOL 350 MG/ML SOLN
INTRAVENOUS | Status: DC | PRN
  Administered 2022-03-11: 60 mL

## 2022-03-11 MED ORDER — ASPIRIN 81 MG PO CHEW: 81.0000 mg | CHEWABLE_TABLET | ORAL | Status: DC

## 2022-03-11 MED ORDER — LABETALOL HCL 5 MG/ML IV SOLN
INTRAVENOUS | Status: AC
  Filled 2022-03-11: qty 4

## 2022-03-11 MED ORDER — SODIUM CHLORIDE 0.9% FLUSH: 3.0000 mL | INTRAVENOUS | Status: DC | PRN

## 2022-03-11 MED ORDER — LIDOCAINE HCL (PF) 1 % IJ SOLN
INTRAMUSCULAR | Status: DC | PRN
  Administered 2022-03-11: 15 mL
  Administered 2022-03-11: 2 mL

## 2022-03-11 MED ORDER — ASPIRIN 81 MG PO CHEW
81.0000 mg | CHEWABLE_TABLET | ORAL | Status: AC
  Administered 2022-03-11: 81 mg via ORAL

## 2022-03-11 MED ORDER — MIDAZOLAM HCL 2 MG/2ML IJ SOLN
INTRAMUSCULAR | Status: AC
  Filled 2022-03-11: qty 2

## 2022-03-11 MED ORDER — MIDAZOLAM HCL 2 MG/2ML IJ SOLN
INTRAMUSCULAR | Status: DC | PRN
  Administered 2022-03-11: 1 mg via INTRAVENOUS

## 2022-03-11 MED ORDER — VERAPAMIL HCL 2.5 MG/ML IV SOLN
INTRAVENOUS | Status: DC | PRN
  Administered 2022-03-11: 10 mL via INTRA_ARTERIAL

## 2022-03-11 MED ORDER — HYDRALAZINE HCL 20 MG/ML IJ SOLN: 10.0000 mg | INTRAMUSCULAR | Status: DC | PRN

## 2022-03-11 MED ORDER — ACETAMINOPHEN 325 MG PO TABS: 650.0000 mg | ORAL_TABLET | ORAL | Status: DC | PRN

## 2022-03-11 MED ORDER — HEPARIN (PORCINE) IN NACL 1000-0.9 UT/500ML-% IV SOLN
INTRAVENOUS | Status: AC
  Filled 2022-03-11: qty 1000

## 2022-03-11 MED ORDER — FENTANYL CITRATE (PF) 100 MCG/2ML IJ SOLN
INTRAMUSCULAR | Status: AC
  Filled 2022-03-11: qty 2

## 2022-03-11 MED ORDER — FENTANYL CITRATE (PF) 100 MCG/2ML IJ SOLN
INTRAMUSCULAR | Status: DC | PRN
  Administered 2022-03-11: 25 ug via INTRAVENOUS

## 2022-03-11 MED ORDER — SODIUM CHLORIDE 0.9 % WEIGHT BASED INFUSION
1.0000 mL/kg/h | INTRAVENOUS | Status: DC
  Administered 2022-03-11: 1 mL/kg/h via INTRAVENOUS

## 2022-03-11 MED ORDER — HEPARIN (PORCINE) IN NACL 1000-0.9 UT/500ML-% IV SOLN
INTRAVENOUS | Status: DC | PRN
  Administered 2022-03-11 (×2): 500 mL

## 2022-03-11 SURGICAL SUPPLY — 14 items
CATH 5FR JL3.5 JR4 ANG PIG MP (CATHETERS) ×2 IMPLANT
CATH BALLN WEDGE 5F 110CM (CATHETERS) ×1 IMPLANT
CATH SWAN GANZ 7F STRAIGHT (CATHETERS) ×1 IMPLANT
DEVICE RAD COMP TR BAND LRG (VASCULAR PRODUCTS) ×1 IMPLANT
GLIDESHEATH SLEND SS 6F .021 (SHEATH) ×1 IMPLANT
GUIDEWIRE INQWIRE 1.5J.035X260 (WIRE) IMPLANT
INQWIRE 1.5J .035X260CM (WIRE) ×2
KIT HEART LEFT (KITS) ×6 IMPLANT
PACK CARDIAC CATHETERIZATION (CUSTOM PROCEDURE TRAY) ×6 IMPLANT
SHEATH GLIDE SLENDER 4/5FR (SHEATH) ×1 IMPLANT
SHEATH PINNACLE 7F 10CM (SHEATH) ×1 IMPLANT
SHEATH PROBE COVER 6X72 (BAG) ×1 IMPLANT
TRANSDUCER W/STOPCOCK (MISCELLANEOUS) ×6 IMPLANT
TUBING CIL FLEX 10 FLL-RA (TUBING) ×6 IMPLANT

## 2022-03-11 NOTE — Interval H&P Note (Signed)
History and Physical Interval Note: ? ?03/11/2022 ?9:31 AM ? ?Mitchell Humphrey  has presented today for surgery, with the diagnosis of severe as.  The various methods of treatment have been discussed with the patient and family. After consideration of risks, benefits and other options for treatment, the patient has consented to  Procedure(s): ?RIGHT/LEFT HEART CATH AND CORONARY ANGIOGRAPHY (N/A) as a surgical intervention.  The patient's history has been reviewed, patient examined, no change in status, stable for surgery.  I have reviewed the patient's chart and labs.  Questions were answered to the patient's satisfaction.   ? ?Cath Lab Visit (complete for each Cath Lab visit) ? ?Clinical Evaluation Leading to the Procedure:  ? ?ACS: No. ? ?Non-ACS:   ? ?Anginal Classification: CCS II ? ?Anti-ischemic medical therapy: No Therapy ? ?Non-Invasive Test Results: No non-invasive testing performed ? ?Prior CABG: No previous CABG ? ? ? ? ? ? ? ?Lauree Chandler ? ? ?

## 2022-03-11 NOTE — Discharge Instructions (Signed)
Increase HCTZ to 25mg daily

## 2022-03-11 NOTE — Progress Notes (Signed)
Site area: rt groin venous site ?Site Prior to Removal:  Level 0 ?Pressure Applied For: 10 minutes ?Manual:   yes ?Patient Status During Pull:  stable ?Post Pull Site:  Level 0 ?Post Pull Instructions Given:  yes ?Post Pull Pulses Present: rt dp palpable 2+ ?Dressing Applied:  gauze and tegaderm ?Bedrest begins @ 8978 ?Comments: ?  ?

## 2022-03-12 ENCOUNTER — Encounter (HOSPITAL_COMMUNITY): Payer: Self-pay | Admitting: Cardiovascular Disease

## 2022-03-12 LAB — POCT ACTIVATED CLOTTING TIME: Activated Clotting Time: 185 seconds

## 2022-03-26 ENCOUNTER — Ambulatory Visit: Payer: Medicare PPO | Admitting: Pulmonary Disease

## 2022-03-26 ENCOUNTER — Encounter: Payer: Self-pay | Admitting: Pulmonary Disease

## 2022-03-26 VITALS — BP 148/78 | HR 63 | Ht 70.0 in | Wt 252.0 lb

## 2022-03-26 DIAGNOSIS — J849 Interstitial pulmonary disease, unspecified: Secondary | ICD-10-CM | POA: Diagnosis not present

## 2022-03-26 NOTE — Progress Notes (Signed)
? ?  Subjective:  ? ? Patient ID: Mitchell Humphrey, male    DOB: [birth year], 81 y.o.   MRN: [id number] ? ?HPI ? ?81 yo never smoker presents for surgical clearance prior to TAVR ?He is vaccinated but not boosted.  He worked as a Development worker, community.   ? ?He was last seen 80/2022 ?He was hospitalized 1/32 February 3 for Covid pneumonia, discharged on 3 L oxygen.  Course was complicated by left lower extremity below-knee DVT for which he was placed on Eliquis.  CT imaging also showed mild mediastinal lymphadenopathy and a right adrenal adenoma ?He developed mild anemia, hemoglobin was 11.9 and stool guaiac was positive.  Colonoscopy was never performed  ?  ?Chief Complaint  ?Patient presents with  ? Follow-up  ?  Surgical clearance - heart valve replacement  ? ?After his last visit with [place] in 2022, he weaned off oxygen and this was discontinued. 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 Angelena Form, cardiology reports were reviewed and imaging.  TAVR is being contemplated and he presents for surgical clearance due to his history of lung disease ?He states that he is able to walk 1.5 miles daily.  He is accompanied by his wife [name] who corroborates history ? ?He was able to ambulate 2 laps and saturation stayed at 100%, heart rate increased from 64-85 ?  ?Significant tests/ events reviewed ?  ? ?CT cors 02/23/22 Areas of peripheral predominant mild ground-glass and architectural distortion bilaterally ? ?Echo 12/2020 normal LVEF. ?CT angiogram chest 12/15/2020 mild mediastinal lymphadenopathy, reactive, right 2.6 cm adrenal adenoma ?  ?Venous duplex 12/16/20 left gastrocnemius DVT ?01/2021 Duplex neg  ?  ? ?Review of Systems ?neg for any significant sore throat, dysphagia, itching, sneezing, nasal congestion or excess/ purulent secretions, fever, chills, sweats, unintended wt loss, pleuritic or exertional cp, hempoptysis, orthopnea pnd or change in chronic leg swelling. Also denies  presyncope, palpitations, heartburn, abdominal pain, nausea, vomiting, diarrhea or change in bowel or urinary habits, dysuria,hematuria, rash, arthralgias, visual complaints, headache, numbness weakness or ataxia. ? ?   ?Objective:  ? Physical Exam ? ?Gen. Pleasant, obese, in no distress ?ENT - no lesions, no post nasal drip ?Neck: No JVD, no thyromegaly, no carotid bruits ?Lungs: no use of accessory muscles, no dullness to percussion, decreased without rales or rhonchi  ?Cardiovascular: Rhythm regular, heart sounds  normal, ESM 3/6 @ base , no peripheral edema ?Musculoskeletal: No deformities, no cyanosis or clubbing , no tremors ? ? ? ? ? ?   ?Assessment & Plan:  ? ? ?

## 2022-03-26 NOTE — Patient Instructions (Signed)
?  You have mild residual scarring from covid infection in 2022 ? ?X schedule  PFTS ? ?X Ambulatory sat ?

## 2022-03-27 ENCOUNTER — Telehealth: Payer: Self-pay | Admitting: Physician Assistant

## 2022-03-27 ENCOUNTER — Ambulatory Visit (HOSPITAL_COMMUNITY)
Admission: RE | Admit: 2022-03-27 | Discharge: 2022-03-27 | Disposition: A | Discharge status: Home / Self Care | Payer: Medicare PPO | Source: Ambulatory Visit | Attending: Cardiovascular Disease | Admitting: Cardiovascular Disease

## 2022-03-27 DIAGNOSIS — J849 Interstitial pulmonary disease, unspecified: Secondary | ICD-10-CM | POA: Insufficient documentation

## 2022-03-27 DIAGNOSIS — I350 Nonrheumatic aortic (valve) stenosis: Secondary | ICD-10-CM | POA: Diagnosis not present

## 2022-03-27 DIAGNOSIS — N400 Enlarged prostate without lower urinary tract symptoms: Secondary | ICD-10-CM | POA: Diagnosis not present

## 2022-03-27 DIAGNOSIS — R918 Other nonspecific abnormal finding of lung field: Secondary | ICD-10-CM | POA: Diagnosis not present

## 2022-03-27 DIAGNOSIS — K5730 Diverticulosis of large intestine without perforation or abscess without bleeding: Secondary | ICD-10-CM | POA: Diagnosis not present

## 2022-03-27 DIAGNOSIS — I2510 Atherosclerotic heart disease of native coronary artery without angina pectoris: Secondary | ICD-10-CM | POA: Diagnosis not present

## 2022-03-27 DIAGNOSIS — Z01818 Encounter for other preprocedural examination: Secondary | ICD-10-CM | POA: Diagnosis not present

## 2022-03-27 MED ORDER — IOHEXOL 350 MG/ML SOLN
100.0000 mL | Freq: Once | INTRAVENOUS | Status: AC | PRN
  Administered 2022-03-27: 100 mL via INTRAVENOUS

## 2022-03-27 NOTE — Telephone Encounter (Signed)
?  [facility]   ?MULTIDISCIPLINARY HEART VALVE TEAM ? ? ?Patient was supposed to have Lopressor called into pharmacy for pre medication prior to TAVR CTs. He never received the medication. I had him come in an hour early to be treated with one '5mg'$  tablet of ivabradine at 9:20am.  ? ? ?Angelena Form PA-C  [facility]  ? ?

## 2022-03-27 NOTE — Assessment & Plan Note (Signed)
He has minimal residual scarring on his CT from 02/2022 which is likely related to COVID-pneumonia.  I do not think he has another form of ILD.  His exercise tolerance appears to be good and he is able to walk 1.5 miles daily. ?We will obtain PFTs to quantitate lung function.  He does not desaturate on ambulation today. ?We will clear him for TAVR surgery with due risk from a pulmonary standpoint unless pulmonary function test shows severe restriction ?

## 2022-04-03 ENCOUNTER — Encounter: Payer: Self-pay | Admitting: Physician Assistant

## 2022-04-03 ENCOUNTER — Ambulatory Visit (INDEPENDENT_AMBULATORY_CARE_PROVIDER_SITE_OTHER): Payer: Medicare PPO | Admitting: Physician Assistant

## 2022-04-03 VITALS — BP 136/80 | HR 74 | Temp 98.6°F | Ht 70.0 in | Wt 253.0 lb

## 2022-04-03 DIAGNOSIS — R0781 Pleurodynia: Secondary | ICD-10-CM | POA: Diagnosis not present

## 2022-04-03 MED ORDER — ACETAMINOPHEN-CODEINE 300-30 MG PO TABS: 1.0000 | ORAL_TABLET | Freq: Four times a day (QID) | ORAL | 0 refills | Status: DC | PRN

## 2022-04-03 NOTE — Patient Instructions (Signed)
It was great to see you!  Trial the tylenol with codeine that we sent in for severe pain-- may take every 6 hours. Can make you sleepy, do not drive while on this.  Make sure you are taking mobic 15 mg daily -- this is an anti-inflammatory  Call us if we need to get xray or new concerns!  Take care,  Inda Coke PA-C

## 2022-04-03 NOTE — Progress Notes (Signed)
Mitchell Humphrey is a 81 y.o. male here for a new problem.  History of Present Illness:   Chief Complaint  Patient presents with   c/o Rib pain    Pt fell on 5/15 and c/o pain right rib area. He is taking Tylenol    HPI  Rib pain Fell on 5/15 while running after his dog. He fell onto the R side of his flank, onto his ribs. He has had pain since that time. Hx of prior rib injury, this feels the same. Denies: SOB, CP, obvious bony deformity, cough. Having difficulty sleeping due to pain. Currently taking tylenol prn. Has mobic 15 mg on his rx list but he is not sure he is taking this.  Past Medical History:  Diagnosis Date   Allergy    Arthritis    BPH (benign prostatic hyperplasia)    Cataract    Dyslipidemia    Erectile dysfunction    GERD (gastroesophageal reflux disease)    Hx of adenomatous colonic polyps    Hyperlipidemia    Hypertension    Hypogonadism male    Hypothyroid    Obesity      Social History   Tobacco Use   Smoking status: Never   Smokeless tobacco: Never   Tobacco comments:    He chews on a cigar  Vaping Use   Vaping Use: Never used  Substance Use Topics   Alcohol use: No   Drug use: No    Past Surgical History:  Procedure Laterality Date   COLONOSCOPY  08/2008   Dr. Watt Climes   HERNIA REPAIR  1970   [facility]   RIGHT HEART CATH AND CORONARY ANGIOGRAPHY N/A 03/11/2022   Procedure: RIGHT HEART CATH AND CORONARY ANGIOGRAPHY;  Surgeon: Burnell Blanks, MD;  Location: [facility];  Service: Cardiovascular;  Laterality: N/A;    Family History  Problem Relation Age of Onset   Breast cancer Mother    Lung cancer Father    Brain cancer Father    Breast cancer Sister    Heart attack Sister     Allergies  Allergen Reactions   Penicillins Rash    Current Medications:   Current Outpatient Medications:    acetaminophen (TYLENOL) 500 MG tablet, Take 1,000 mg by mouth every 6 (six) hours as needed for moderate pain., Disp: , Rfl:     aspirin EC 81 MG tablet, Take 81 mg by mouth daily. Swallow whole., Disp: , Rfl:    atorvastatin (LIPITOR) 20 MG tablet, Take 1 tablet (20 mg total) by mouth daily., Disp: 90 tablet, Rfl: 1   Cholecalciferol (VITAMIN D3) 125 MCG (5000 UT) CAPS, Take 5,000 Units by mouth daily., Disp: , Rfl:    enalapril (VASOTEC) 10 MG tablet, Take 1 tablet (10 mg total) by mouth daily., Disp: 90 tablet, Rfl: 1   finasteride (PROSCAR) 5 MG tablet, Take 1 tablet (5 mg total) by mouth daily., Disp: 90 tablet, Rfl: 1   GARCINIA CAMBOGIA-CHROMIUM PO, Take 1,000 mg by mouth daily., Disp: , Rfl:    guaifenesin (HUMIBID E) 400 MG TABS tablet, Take 400 mg by mouth daily., Disp: , Rfl:    hydrochlorothiazide (MICROZIDE) 12.5 MG capsule, Take 1 capsule (12.5 mg total) by mouth daily. (Patient taking differently: Take 25 mg by mouth daily.), Disp: 90 capsule, Rfl: 1   ipratropium (ATROVENT) 0.06 % nasal spray, Place 2 sprays into both nostrils 4 (four) times daily., Disp: 15 mL, Rfl: 12   levothyroxine (SYNTHROID) 150 MCG  tablet, Take 1 tablet po each morning prior to breakfast, Disp: 90 tablet, Rfl: 1   loratadine (CLARITIN) 10 MG tablet, Take 10 mg by mouth daily., Disp: , Rfl:    meloxicam (MOBIC) 15 MG tablet, Take 1 tablet (15 mg total) by mouth daily., Disp: 90 tablet, Rfl: 1   Misc Natural Products (URINOZINC PROSTATE) CAPS, Take 1 capsule by mouth daily., Disp: , Rfl:    Multiple Vitamins-Minerals (IMMUNE SUPPORT PO), Take 1 tablet by mouth daily. NHR Immune Protect, Disp: , Rfl:    Polyethyl Glycol-Propyl Glycol (SYSTANE OP), Place 1 drop into both eyes daily as needed (irritation)., Disp: , Rfl:    Saw Palmetto 1000 MG CAPS, Take 1,000 mg by mouth 2 (two) times daily., Disp: , Rfl:    tamsulosin (FLOMAX) 0.4 MG CAPS capsule, Take 1 capsule (0.4 mg total) by mouth daily., Disp: 90 capsule, Rfl: 1   vitamin C (ASCORBIC ACID) 500 MG tablet, Take 500 mg by mouth daily., Disp: , Rfl:    Review of Systems:    ROS Negative unless otherwise specified per HPI.  Vitals:   Vitals:   04/03/22 1302  BP: 136/80  Pulse: 74  Temp: 98.6 F (37 C)  TempSrc: Temporal  SpO2: 96%  Weight: 253 lb (114.8 kg)  Height: '5\' 10"'$  (1.778 m)     Body mass index is 36.3 kg/m.  Physical Exam:   Physical Exam Vitals and nursing note reviewed.  Constitutional:      General: He is not in acute distress.    Appearance: He is well-developed. He is not ill-appearing or toxic-appearing.  Cardiovascular:     Rate and Rhythm: Normal rate and regular rhythm.     Pulses: Normal pulses.     Heart sounds: S1 normal and S2 normal. Murmur heard.  Pulmonary:     Effort: Pulmonary effort is normal. No accessory muscle usage or respiratory distress.     Breath sounds: Normal breath sounds.  Chest:     Comments: No obvious deformity Mild TTP to r lateral lower rib cage without obvious step-off deformity Skin:    General: Skin is warm and dry.  Neurological:     Mental Status: He is alert.     GCS: GCS eye subscore is 4. GCS verbal subscore is 5. GCS motor subscore is 6.  Psychiatric:        Speech: Speech normal.        Behavior: Behavior normal. Behavior is cooperative.    Assessment and Plan:   Rib pain on right side No red flags Discussed possible fracture, he does not want to get an xray at this time-- I think this is reasonable as his vitals are stable and he is in no acute distress Instructed as follows: "Trial the tylenol with codeine that we sent in for severe pain-- may take every 6 hours. Can make you sleepy, do not drive while on this.  Make sure you are taking mobic 15 mg daily -- this is an anti-inflammatory  Call us if we need to get xray or new concerns!"   Inda Coke, PA-C

## 2022-04-09 ENCOUNTER — Telehealth: Payer: Self-pay | Admitting: Physician Assistant

## 2022-04-09 NOTE — Telephone Encounter (Signed)
Attempted to schedule AWV. Unable to LVM.  Will try at later time.  

## 2022-04-27 ENCOUNTER — Ambulatory Visit (INDEPENDENT_AMBULATORY_CARE_PROVIDER_SITE_OTHER): Payer: Medicare PPO | Admitting: Pulmonary Disease

## 2022-04-27 DIAGNOSIS — J849 Interstitial pulmonary disease, unspecified: Secondary | ICD-10-CM

## 2022-04-27 NOTE — Progress Notes (Signed)
PFT done today. 

## 2022-04-29 LAB — PULMONARY FUNCTION TEST
DL/VA % pred: 94 %
DL/VA: 3.69 ml/min/mmHg/L
DLCO cor % pred: 92 %
DLCO cor: 21.97 ml/min/mmHg
DLCO unc % pred: 89 %
DLCO unc: 21.33 ml/min/mmHg
FEF 25-75 Post: 5.52 L/sec
FEF 25-75 Pre: 5.01 L/sec
FEF2575-%Change-Post: 10 %
FEF2575-%Pred-Post: 285 %
FEF2575-%Pred-Pre: 259 %
FEV1-%Change-Post: 5 %
FEV1-%Pred-Post: 124 %
FEV1-%Pred-Pre: 117 %
FEV1-Post: 3.46 L
FEV1-Pre: 3.28 L
FEV1FVC-%Change-Post: 2 %
FEV1FVC-%Pred-Pre: 118 %
FEV6-%Change-Post: 2 %
FEV6-%Pred-Post: 108 %
FEV6-%Pred-Pre: 105 %
FEV6-Post: 3.95 L
FEV6-Pre: 3.84 L
FEV6FVC-%Change-Post: 0 %
FEV6FVC-%Pred-Post: 107 %
FEV6FVC-%Pred-Pre: 106 %
FVC-%Change-Post: 2 %
FVC-%Pred-Post: 101 %
FVC-%Pred-Pre: 98 %
FVC-Post: 3.95 L
FVC-Pre: 3.86 L
Post FEV1/FVC ratio: 88 %
Post FEV6/FVC ratio: 100 %
Pre FEV1/FVC ratio: 85 %
Pre FEV6/FVC Ratio: 100 %
RV % pred: 116 %
RV: 3.02 L
TLC % pred: 101 %
TLC: 6.92 L

## 2022-04-29 NOTE — Progress Notes (Signed)
Cardiology Office Note:   Date:  05/01/2022  NAME:  Mitchell Humphrey    MRN: [id number] DOB:  03-05-1941   PCP:  Inda Coke, PA  Cardiologist:  None  Electrophysiologist:  None   Referring MD: Inda Coke, PA   Chief Complaint  Patient presents with   Follow-up        History of Present Illness:   Mitchell Humphrey is a 81 y.o. male with a hx of severe aortic stenosis, CAD, diabetes who presents for follow-up.  Recently cleared for TAVR procedure by pulmonology.  Murmur is consistent with severe restenosis.  Calcium score consistent with this.  Left heart catheterization with no significant blockages.  He reports no chest pain.  Still getting winded with activity.  His sister reports he is just not doing what he used to do.  Suspect this is related to aortic stenosis.  He reports heaviness in his legs.  Reports it does give out on him.  He does have poor pulses in the left lower extremity.  I did review his CT scan.  There is concern for moderate to severe aortoiliac atherosclerosis.  There is also concern for a severe stenosis in the right common femoral artery.  We discussed pursuing ABIs and lower extremity arterial duplexes.  He is willing to do this. LDL at goal.  On aspirin.  Blood pressure well controlled.  Problem List 1. DVT -L gastrocnemius vein 12/17/2019 -in setting of Covid PNA 2. Moderate to severe aortic stenosis  -V max 2.8 m/s, MG 22 mmHG, AVA 1.38 cm2, DI 0.33 (12/26/2020) -Vmax 3.5 m/s, MG 27 mm2, AVA 1.4, DI 0.22 3. HTN 4. HLD -T chol 135, HDL 37, LDL 61, TG 181 5.  CAD -calcium score 1599 (81st percentile) 6. DM -A1c 6.4 7. Non-obstructive CAD 8. 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 Watt Climes   HERNIA REPAIR  1970   [facility]   RIGHT HEART CATH AND CORONARY ANGIOGRAPHY N/A 03/11/2022   Procedure: RIGHT HEART CATH AND CORONARY ANGIOGRAPHY;  Surgeon: Burnell Blanks, MD;  Location: [facility];  Service: Cardiovascular;  Laterality: N/A;    Current Medications: Current Meds  Medication Sig   acetaminophen (TYLENOL) 500 MG tablet Take 1,000 mg by mouth every 6 (six) hours as needed for moderate pain.   acetaminophen-codeine (TYLENOL #3) 300-30 MG tablet Take 1 tablet by mouth every 6 (six) hours as needed for moderate pain.   aspirin EC 81 MG tablet Take 81 mg by mouth daily. Swallow whole.   atorvastatin (LIPITOR) 20 MG tablet Take 1 tablet (20 mg total) by mouth daily.   Cholecalciferol (VITAMIN D3) 125 MCG (5000 UT) CAPS Take 5,000 Units by mouth daily.   enalapril (VASOTEC) 10 MG tablet Take 1 tablet (10 mg total) by mouth daily.   finasteride (PROSCAR) 5 MG tablet Take 1 tablet (5 mg total) by mouth daily.   GARCINIA CAMBOGIA-CHROMIUM PO  Take 1,000 mg by mouth daily.   guaifenesin (HUMIBID E) 400 MG TABS tablet Take 400 mg by mouth daily.   hydrochlorothiazide (MICROZIDE) 12.5 MG capsule Take 1 capsule (12.5 mg total) by mouth daily. (Patient taking differently: Take 25 mg by mouth daily.)   ipratropium (ATROVENT) 0.06 % nasal spray Place 2 sprays into both nostrils 4 (four) times daily.   levothyroxine (SYNTHROID) 150 MCG tablet Take 1 tablet po each morning prior to breakfast   loratadine (CLARITIN) 10 MG tablet Take 10 mg by mouth daily.   meloxicam (MOBIC) 15 MG tablet Take 1 tablet (15 mg total) by mouth daily.   Misc Natural Products (URINOZINC PROSTATE) CAPS Take 1 capsule by mouth daily.   Multiple Vitamins-Minerals (IMMUNE SUPPORT PO) Take 1  tablet by mouth daily. NHR Immune Protect   Polyethyl Glycol-Propyl Glycol (SYSTANE OP) Place 1 drop into both eyes daily as needed (irritation).   Saw Palmetto 1000 MG CAPS Take 500 mg by mouth 2 (two) times daily.   tamsulosin (FLOMAX) 0.4 MG CAPS capsule Take 1 capsule (0.4 mg total) by mouth daily.   vitamin C (ASCORBIC ACID) 500 MG tablet Take 500 mg by mouth daily.     Allergies:    Penicillins   Social History: Social History   Socioeconomic History   Marital status: Married    Spouse name: Not on file   Number of children: 1   Years of education: Not on file   Highest education level: Not on file  Occupational History   Occupation: Scientist, research (medical)   Occupation: Retired-Construction  Tobacco Use   Smoking status: Never   Smokeless tobacco: Never   Tobacco comments:    He chews on a cigar  Vaping Use   Vaping Use: Never used  Substance and Sexual Activity   Alcohol use: No   Drug use: No   Sexual activity: Not Currently  Other Topics Concern   Not on file  Social History Narrative   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  ROS:   All other ROS reviewed and negative. Pertinent positives noted in the HPI.     EKGs/Labs/Other Studies Reviewed:   The following studies were personally reviewed by me today:  Recent Labs: 07/14/2021: ALT 18 01/07/2022: TSH 0.514 03/06/2022: BUN 18; Creatinine, Ser 0.84; Platelets 274 03/11/2022: Hemoglobin 13.6; Potassium 4.4; Sodium 139   Recent Lipid Panel    Component  Value Date/Time   CHOL 135 07/14/2021 1113   TRIG 181.0 (H) 07/14/2021 1113   HDL 37.30 (L) 07/14/2021 1113   CHOLHDL 4 07/14/2021 1113   VLDL 36.2 07/14/2021 1113   LDLCALC 61 07/14/2021 1113    Physical Exam:   VS:  BP 112/62   Pulse 65   Ht '5\' 9"'$  (1.753 m)   Wt 251 lb 6.4 oz (114 kg)   SpO2 95%   BMI 37.13 kg/m    Wt Readings from Last 3 Encounters:  05/01/22 251 lb 6.4 oz (114 kg)  04/03/22 253 lb (114.8 kg)  03/26/22 252 lb (114.3 kg)    General: Well nourished, well developed, in no acute distress Head: Atraumatic, normal size  Eyes: PEERLA, EOMI  Neck: Supple, no JVD Endocrine: No thryomegaly Cardiac: Normal S1, 3 out of 6 systolic ejection murmur, harsh, late peaking, absent S2 Lungs: Clear to auscultation bilaterally, no wheezing, rhonchi or rales  Abd: Soft, nontender, no hepatomegaly  Ext: No edema, poor pulses in the lower extremities Musculoskeletal: No deformities, BUE and BLE strength normal and equal Skin: Warm and dry, no rashes   Neuro: Alert and oriented to person, place, time, and situation, CNII-XII grossly intact, no focal deficits  Psych: Normal mood and affect   ASSESSMENT:   Mitchell Humphrey is a 81 y.o. male who presents for the following: 1. Severe aortic stenosis   2. Primary hypertension   3. Agatston coronary artery calcium score greater than 400   4. Coronary artery disease involving native coronary artery of native heart without angina pectoris   5. Weakness of both lower extremities     PLAN:   1. Severe aortic stenosis -Moderate severe arctic stenosis.  Calcium score consistent with severe aortic stenosis.  Murmur consistent with severe aortic stenosis.  He is going to have TAVR soon.  Recently cleared by pulmonology due to lung scarring from COVID-19.  PFTs are normal.  He will reach back out to structural heart to proceed with this procedure.  I do agree with this.  Would like to avoid any congestive heart failure or sinister pathology  related to severe aortic stenosis.  2. Primary hypertension -Blood pressure is well controlled.  3. Agatston coronary artery calcium score greater than 400 4. Coronary artery disease involving native coronary artery of native heart without angina pectoris -Elevated calcium score.  Nonobstructive CAD.  Continue aspirin and statin.  LDL at goal.  5. Weakness of both lower extremities -Reports weakness in both of his legs.  Poor pulses.  CTA for TAVR work-up demonstrates severe right common femoral artery stenosis.  We will proceed with ABIs and lower extremity arterial duplexes.      Disposition: Return in about 6 months (around 10/31/2022).  Medication Adjustments/Labs and Tests Ordered: Current medicines are reviewed at length with the patient today.  Concerns regarding medicines are outlined above.  Orders Placed This Encounter  Procedures   VAS [place] ABI WITH/WO TBI   VAS [place] LOWER EXTREMITY ARTERIAL DUPLEX   No orders of the defined types were placed in this encounter.   Patient Instructions  Medication Instructions:  The current medical regimen is effective;  continue present plan and medications.  *If you need a refill on your cardiac medications before your next appointment, please call your pharmacy*   Testing/Procedures: Your physician has requested that you have an ankle brachial index (ABI). During this test an ultrasound and blood pressure cuff are used to evaluate the arteries that supply the arms and legs with blood. Allow thirty minutes for this exam. There are no restrictions or special instructions.  Your physician has requested that you have a lower extremity arterial duplex. This test is an ultrasound of the arteries in the legs. It looks at arterial blood flow in the legs. Allow one hour for Lower leg scan. There are no restrictions or special instructions    Follow-Up: At [facility], you and your health needs are our priority.  As part of our continuing  mission to provide you with exceptional heart care, we have  created designated Provider Care Teams.  These Care Teams include your primary Cardiologist (physician) and Advanced Practice Providers (APPs -  Physician Assistants and Nurse Practitioners) who all work together to provide you with the care you need, when you need it.  We recommend signing up for the patient portal called "MyChart".  Sign up information is provided on this After Visit Summary.  MyChart is used to connect with patients for Virtual Visits (Telemedicine).  Patients are able to view lab/test results, encounter notes, upcoming appointments, etc.  Non-urgent messages can be sent to your provider as well.   To learn more about what you can do with MyChart, go to [email or web address].    Your next appointment:   6 month(s)  The format for your next appointment:   In Person  Provider:   Eleonore Chiquito, MD              Time Spent with Patient: I have spent a total of 35 minutes with patient reviewing hospital notes, telemetry, EKGs, labs and examining the patient as well as establishing an assessment and plan that was discussed with the patient.  > 50% of time was spent in direct patient care.  Signed, Addison Naegeli. Audie Box, MD, [place]  [address] [place], [place] [address] [phone]  05/01/2022 2:45 PM

## 2022-05-01 ENCOUNTER — Encounter: Payer: Self-pay | Admitting: Cardiovascular Disease

## 2022-05-01 ENCOUNTER — Ambulatory Visit: Payer: Medicare PPO | Admitting: Cardiovascular Disease

## 2022-05-01 VITALS — BP 112/62 | HR 65 | Ht 69.0 in | Wt 251.4 lb

## 2022-05-01 DIAGNOSIS — I350 Nonrheumatic aortic (valve) stenosis: Secondary | ICD-10-CM | POA: Diagnosis not present

## 2022-05-01 DIAGNOSIS — I2510 Atherosclerotic heart disease of native coronary artery without angina pectoris: Secondary | ICD-10-CM

## 2022-05-01 DIAGNOSIS — I10 Essential (primary) hypertension: Secondary | ICD-10-CM

## 2022-05-01 DIAGNOSIS — R29898 Other symptoms and signs involving the musculoskeletal system: Secondary | ICD-10-CM | POA: Diagnosis not present

## 2022-05-01 DIAGNOSIS — R931 Abnormal findings on diagnostic imaging of heart and coronary circulation: Secondary | ICD-10-CM | POA: Diagnosis not present

## 2022-05-01 NOTE — Patient Instructions (Signed)
Medication Instructions:  The current medical regimen is effective;  continue present plan and medications.  *If you need a refill on your cardiac medications before your next appointment, please call your pharmacy*   Testing/Procedures: Your physician has requested that you have an ankle brachial index (ABI). During this test an ultrasound and blood pressure cuff are used to evaluate the arteries that supply the arms and legs with blood. Allow thirty minutes for this exam. There are no restrictions or special instructions.  Your physician has requested that you have a lower extremity arterial duplex. This test is an ultrasound of the arteries in the legs. It looks at arterial blood flow in the legs. Allow one hour for Lower leg scan. There are no restrictions or special instructions    Follow-Up: At [facility], you and your health needs are our priority.  As part of our continuing mission to provide you with exceptional heart care, we have created designated Provider Care Teams.  These Care Teams include your primary Cardiologist (physician) and Advanced Practice Providers (APPs -  Physician Assistants and Nurse Practitioners) who all work together to provide you with the care you need, when you need it.  We recommend signing up for the patient portal called "MyChart".  Sign up information is provided on this After Visit Summary.  MyChart is used to connect with patients for Virtual Visits (Telemedicine).  Patients are able to view lab/test results, encounter notes, upcoming appointments, etc.  Non-urgent messages can be sent to your provider as well.   To learn more about what you can do with MyChart, go to [email or web address].    Your next appointment:   6 month(s)  The format for your next appointment:   In Person  Provider:   Eleonore Chiquito, MD

## 2022-05-04 ENCOUNTER — Encounter: Payer: Self-pay | Admitting: Physician Assistant

## 2022-05-06 ENCOUNTER — Other Ambulatory Visit: Payer: Self-pay | Admitting: Physician Assistant

## 2022-05-15 ENCOUNTER — Ambulatory Visit (HOSPITAL_COMMUNITY)
Admission: RE | Admit: 2022-05-15 | Discharge: 2022-05-15 | Disposition: A | Discharge status: Home / Self Care | Payer: Medicare PPO | Source: Ambulatory Visit | Attending: Cardiovascular Disease | Admitting: Cardiovascular Disease

## 2022-05-15 DIAGNOSIS — M79604 Pain in right leg: Secondary | ICD-10-CM | POA: Diagnosis not present

## 2022-05-15 DIAGNOSIS — M79605 Pain in left leg: Secondary | ICD-10-CM | POA: Diagnosis not present

## 2022-05-15 DIAGNOSIS — R29898 Other symptoms and signs involving the musculoskeletal system: Secondary | ICD-10-CM | POA: Insufficient documentation

## 2022-05-18 ENCOUNTER — Other Ambulatory Visit: Payer: Self-pay | Admitting: Physician Assistant

## 2022-05-18 ENCOUNTER — Other Ambulatory Visit: Payer: Self-pay

## 2022-05-18 DIAGNOSIS — I350 Nonrheumatic aortic (valve) stenosis: Secondary | ICD-10-CM

## 2022-05-20 ENCOUNTER — Telehealth: Payer: Self-pay | Admitting: Cardiovascular Disease

## 2022-05-20 ENCOUNTER — Encounter: Payer: Self-pay | Admitting: Surgery

## 2022-05-20 ENCOUNTER — Institutional Professional Consult (permissible substitution): Payer: Medicare PPO | Admitting: Surgery

## 2022-05-20 VITALS — BP 160/74 | HR 87 | Resp 20 | Ht 70.0 in | Wt 252.0 lb

## 2022-05-20 DIAGNOSIS — I350 Nonrheumatic aortic (valve) stenosis: Secondary | ICD-10-CM

## 2022-05-20 NOTE — Telephone Encounter (Signed)
Patient had Vas [place] LE Art on 6/30 with results. Patient asked why he was scheduled for another one on 7/18. Recommended he contact vascular studies who called him and inquire.

## 2022-05-20 NOTE — Telephone Encounter (Signed)
Pt called asking why was he schedule for LE arterial doppler on 06/02/22. He states that he just had that done. Please advise.

## 2022-05-20 NOTE — Telephone Encounter (Signed)
Called patient, advised leg scans were not needed. I did cancel the testing.  Patient wife verbalized understanding.

## 2022-05-21 IMAGING — CT CT ANGIO CHEST
3 of 7 series · 18 of 36 positions shown · IV contrast (omnipaque)
Comparison: Portable chest obtained earlier today.

CLINICAL DATA: Shortness of breath and hypoxia. R416U-DH positive.
Myalgias.

EXAM:
CT ANGIOGRAPHY CHEST WITH CONTRAST
TECHNIQUE: Multidetector CT imaging of the chest was performed using the
standard protocol during bolus administration of intravenous
contrast. Multiplanar CT image reconstructions and MIPs were
obtained to evaluate the vascular anatomy.
CONTRAST:  75mL OMNIPAQUE IOHEXOL 350 MG/ML SOLN

[Series 6: pe thins · axial · 0.88mm/px · z∈[+1169,+1454]mm · 13 of 222 slices shown]
[im 16/222  lung]
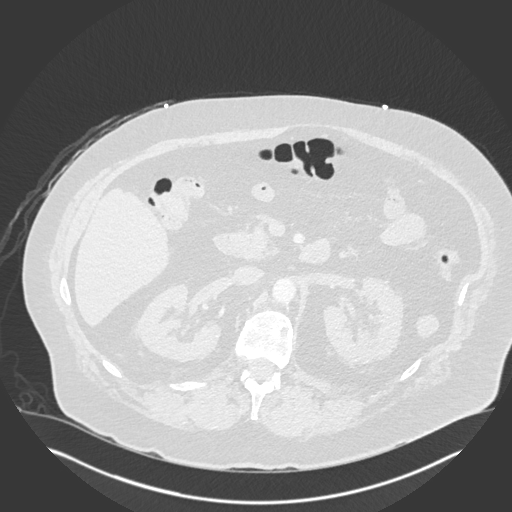
[im 32/222  mediastinal]
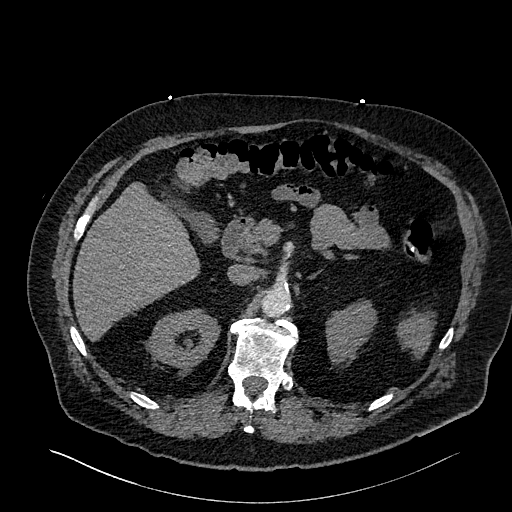
[im 48/222  lung]
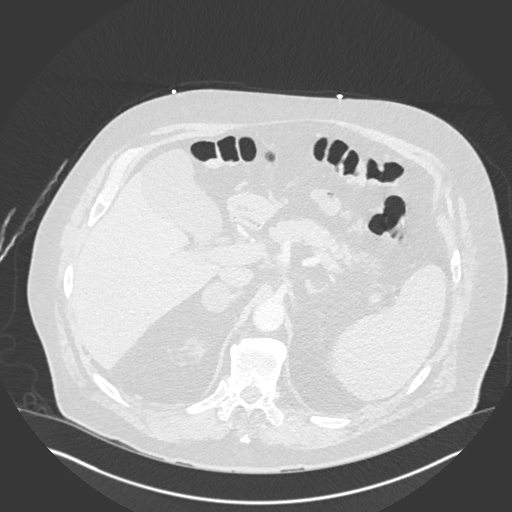
[im 64/222  mediastinal]
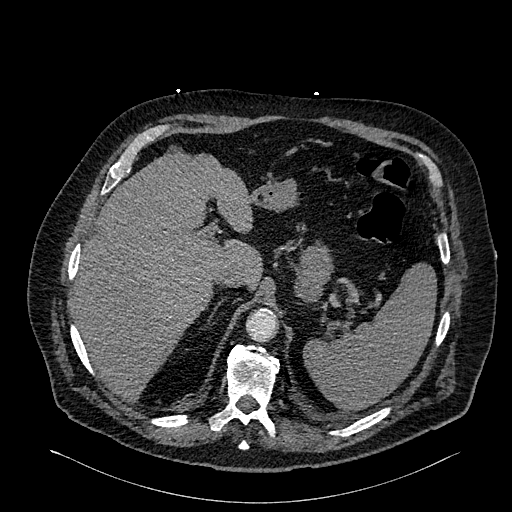
[im 79/222  lung]
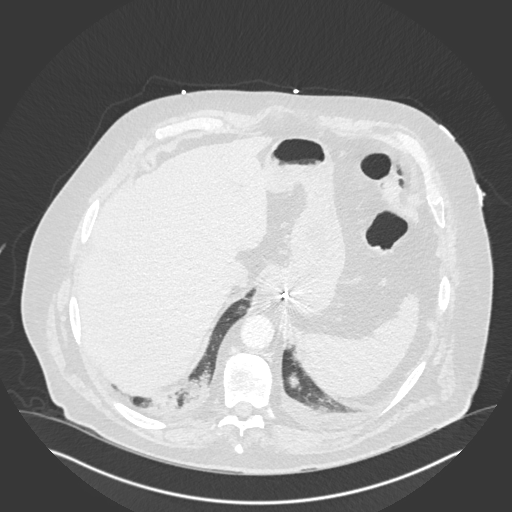
[im 95/222  mediastinal]
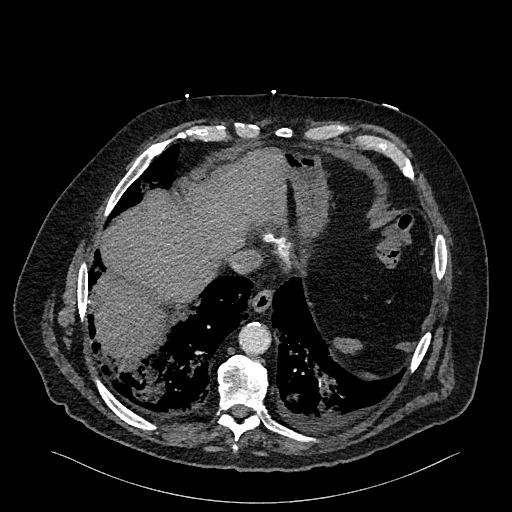
[im 111/222  lung]
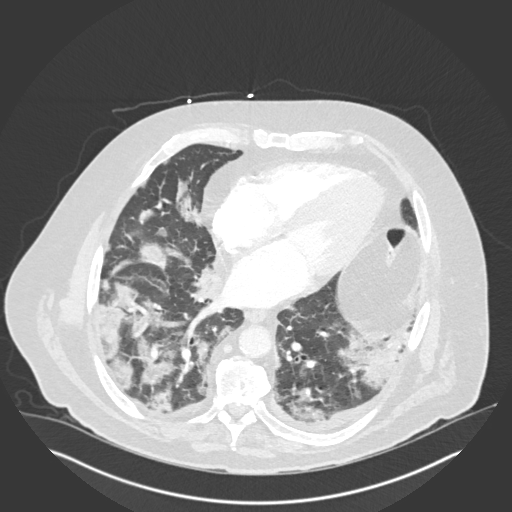
[im 127/222  mediastinal]
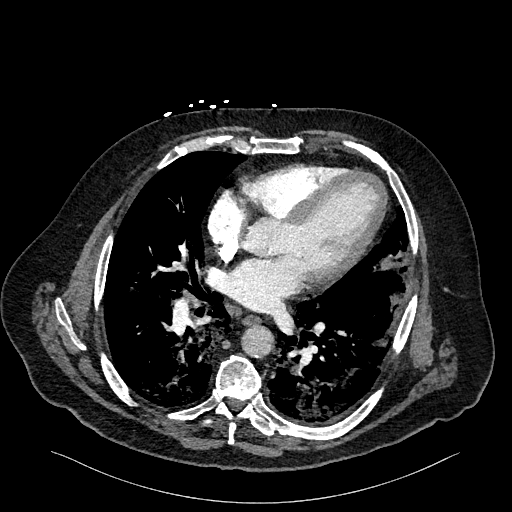
[im 143/222  lung]
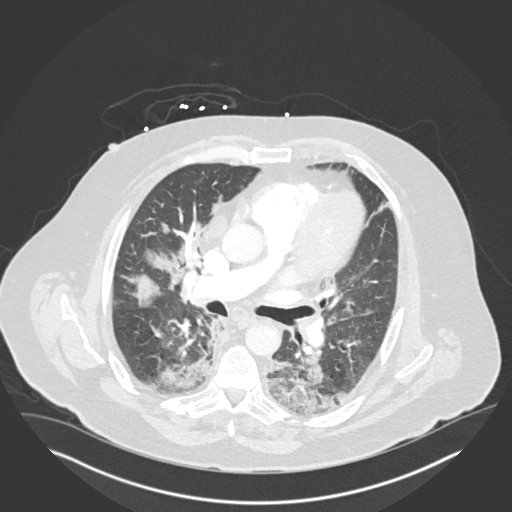
[im 158/222  mediastinal]
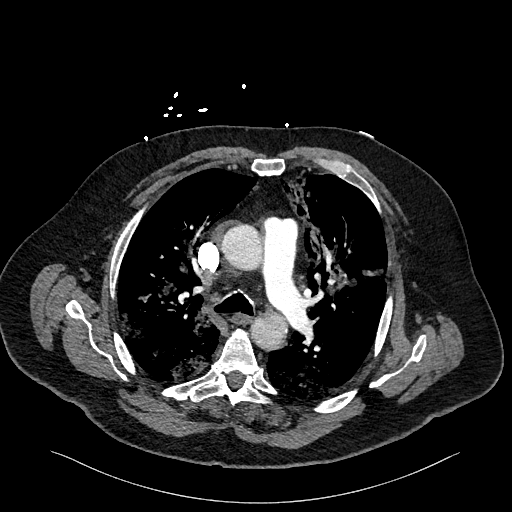
[im 174/222  lung]
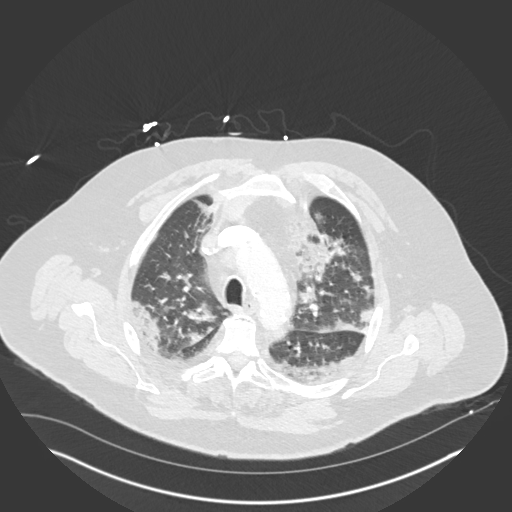
[im 190/222  mediastinal]
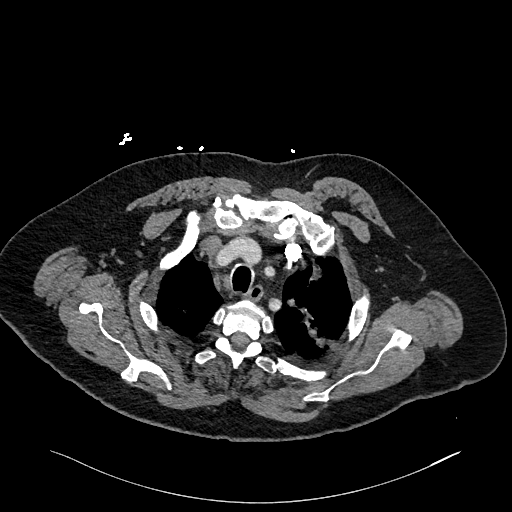
[im 206/222  lung]
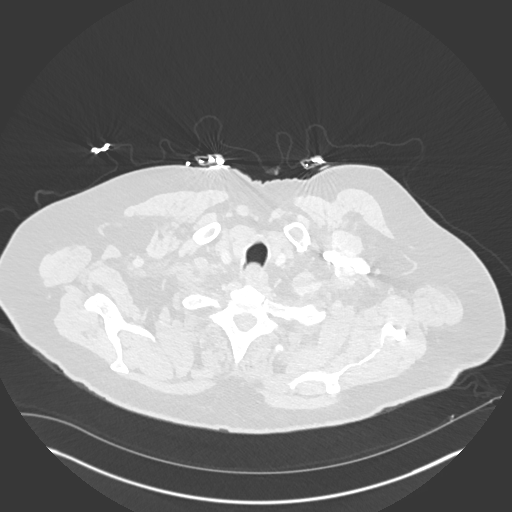

[Series 7: pe lung · axial · 0.88mm/px · z∈[+1271,+1415]mm · 4 of 82 slices shown]
[im 17/82  mediastinal]
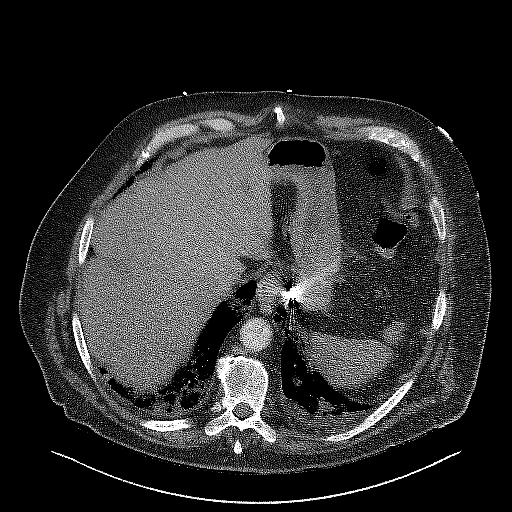
[im 33/82  mediastinal]
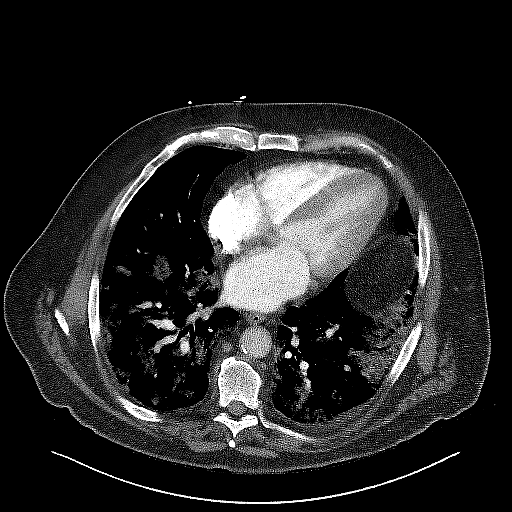
[im 49/82  mediastinal]
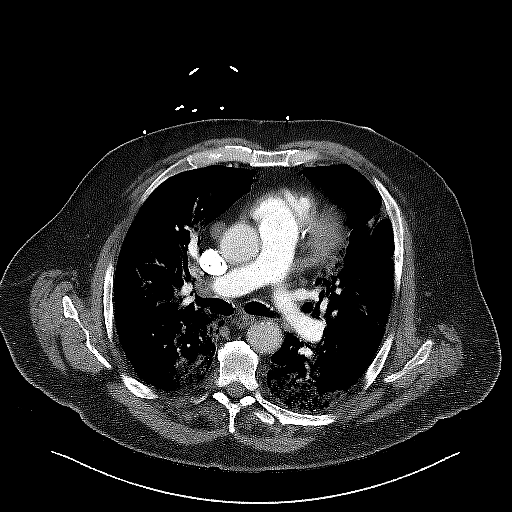
[im 65/82  mediastinal]
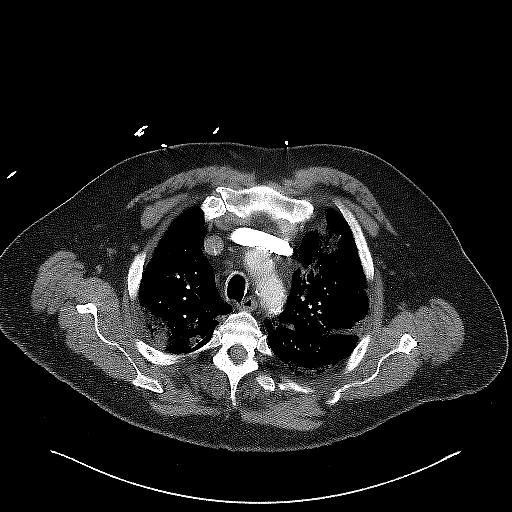

[Series 8: pe 2mm cor · coronal · 0.75mm/px · 1 of 124 slices shown]
[im 62/124  mediastinal]
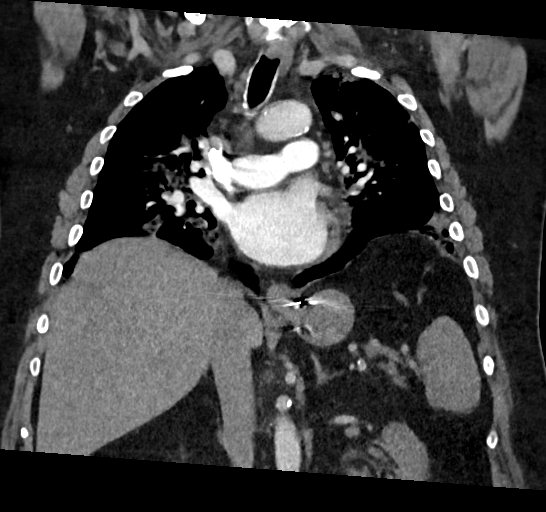

[18 of 36 positions shown; findings below may reference images not displayed]

FINDINGS: Cardiovascular: Mildly enlarged heart. Normally opacified pulmonary
arteries with no pulmonary arterial filling defects seen.

Mediastinum/Nodes: Mildly enlarged subcarinal nodes. The largest has
a short axis diameter of 11 mm on image number 48 series 5. There is
also a mildly enlarged right paratracheal node with a short axis
diameter 10 mm on image number 15 series 5.

Unremarkable thyroid gland.  No visible esophageal abnormality.

Lungs/Pleura: Large number of focal patchy opacities in both lungs,
involving all lobes. Small left pleural effusion and minimal right
pleural effusion.

Upper Abdomen: Multiple surgical clips in the region of the
gastroesophageal junction. 2.6 cm oval mass in the right adrenal
gland with a mean density of 4 Hounsfield units on image number 88
series 5. Accessory splenule inferior to the spleen.

Musculoskeletal: Thoracic and lower cervical spine degenerative
changes.

Review of the MIP images confirms the above findings.
IMPRESSION: 1. No pulmonary emboli.
2. Large number of focal patchy opacities in both lungs, involving
all lobes, compatible with R416U-DH pneumonia.
3. Small left pleural effusion and minimal right pleural effusion.
4. Mild mediastinal adenopathy, most likely reactive.
5. 2.6 cm right adrenal adenoma.

## 2022-05-21 NOTE — Pre-Procedure Instructions (Signed)
Surgical Instructions    Your procedure is scheduled on Tuesday, July 11.  Report to [facility] Main Entrance "A" at 10:15 A.M., then check in with the Admitting office.  Call this number if you have problems the morning of surgery:  [phone]   If you have any questions prior to your surgery date call [phone]: Open Monday-Friday 8am-4pm    Remember:  Do not eat or drink after midnight the night before your surgery   Medication instructions per your surgeon:   STOP on 7/5 taking any Aleve, Naproxen, Ibuprofen, Motrin, Advil, Goody's, BC's, all herbal medications, fish oil, and all non-prescription vitamins, Meloxicam (mobic)  Continue taking all other medications including Aspirin without change through the day before surgery. On the morning of surgery take only Levothyroxine with a sip of water.    [facility] is not responsible for any belongings or valuables.   Do NOT Smoke (Tobacco/Vaping)  24 hours prior to your procedure  If you use a CPAP at night, you may bring your mask for your overnight stay.   Contacts, glasses, hearing aids, dentures or partials may not be worn into surgery, please bring cases for these belongings   For patients admitted to the hospital, discharge time will be determined by your treatment team.   Patients discharged the day of surgery will not be allowed to drive home, and someone needs to stay with them for 24 hours.   SURGICAL WAITING ROOM VISITATION Patients having surgery or a procedure in a hospital may have two support people. Children under the age of 66 must have an adult with them who is not the patient. They may stay in the waiting area during the procedure and may switch out with other visitors. If the patient needs to stay at the hospital during part of their recovery, the visitor guidelines for inpatient rooms apply.  Please refer to the [facility] website for the visitor guidelines for Inpatients (after your surgery is over  and you are in a regular room).       Special instructions:    Oral Hygiene is also important to reduce your risk of infection.  Remember - BRUSH YOUR TEETH THE MORNING OF SURGERY WITH YOUR REGULAR TOOTHPASTE   [place]- Preparing For Surgery  Before surgery, you can play an important role. Because skin is not sterile, your skin needs to be as free of germs as possible. You can reduce the number of germs on your skin by washing with CHG (chlorahexidine gluconate) Soap before surgery.  CHG is an antiseptic cleaner which kills germs and bonds with the skin to continue killing germs even after washing.     Please do not use if you have an allergy to CHG or antibacterial soaps. If your skin becomes reddened/irritated stop using the CHG.  Do not shave (including legs and underarms) for at least 48 hours prior to first CHG shower. It is OK to shave your face.  Please follow these instructions carefully.     Shower the NIGHT BEFORE SURGERY and the MORNING OF SURGERY with CHG Soap.   If you chose to wash your hair, wash your hair first as usual with your normal shampoo. After you shampoo, rinse your hair and body thoroughly to remove the shampoo.  Then [employer] and genitals (private parts) with your normal soap and rinse thoroughly to remove soap.  After that Use CHG Soap as you would any other liquid soap. You can apply CHG directly to the skin  and wash gently with a scrungie or a clean washcloth.   Apply the CHG Soap to your body ONLY FROM THE NECK DOWN.  Do not use on open wounds or open sores. Avoid contact with your eyes, ears, mouth and genitals (private parts). Wash Face and genitals (private parts)  with your normal soap.   Wash thoroughly, paying special attention to the area where your surgery will be performed.  Thoroughly rinse your body with warm water from the neck down.  DO NOT shower/wash with your normal soap after using and rinsing off the CHG Soap.  Pat yourself dry  with a CLEAN TOWEL.  Wear CLEAN PAJAMAS to bed the night before surgery  Place CLEAN SHEETS on your bed the night before your surgery  DO NOT SLEEP WITH PETS.   Day of Surgery:  Take a shower with CHG soap. Wear Clean/Comfortable clothing the morning of surgery Do not wear jewelry or makeup Do not wear lotions, powders, perfumes/colognes, or deodorant. Do not shave 48 hours prior to surgery.  Men may shave face and neck. Do not bring valuables to the hospital. Do not wear nail polish, gel polish, artificial nails, or any other type of covering on natural nails (fingers and toes) If you have artificial nails or gel coating that need to be removed by a nail salon, please have this removed prior to surgery. Artificial nails or gel coating may interfere with anesthesia's ability to adequately monitor your vital signs. Remember to brush your teeth WITH YOUR REGULAR TOOTHPASTE.    If you received a COVID test during your pre-op visit, it is requested that you wear a mask when out in public, stay away from anyone that may not be feeling well, and notify your surgeon if you develop symptoms. If you have been in contact with anyone that has tested positive in the last 10 days, please notify your surgeon.    Please read over the following fact sheets that you were given.

## 2022-05-22 ENCOUNTER — Ambulatory Visit (HOSPITAL_COMMUNITY)
Admission: RE | Admit: 2022-05-22 | Discharge: 2022-05-22 | Disposition: A | Discharge status: Home / Self Care | Payer: Medicare PPO | Source: Ambulatory Visit | Attending: Cardiovascular Disease | Admitting: Cardiovascular Disease

## 2022-05-22 ENCOUNTER — Encounter: Payer: Self-pay | Admitting: Surgery

## 2022-05-22 ENCOUNTER — Encounter (HOSPITAL_COMMUNITY): Payer: Self-pay

## 2022-05-22 ENCOUNTER — Other Ambulatory Visit: Payer: Self-pay

## 2022-05-22 ENCOUNTER — Encounter (HOSPITAL_COMMUNITY)
Admission: RE | Admit: 2022-05-22 | Discharge: 2022-05-22 | Disposition: A | Discharge status: Home / Self Care | Payer: Medicare PPO | Source: Ambulatory Visit | Attending: Cardiovascular Disease | Admitting: Cardiovascular Disease

## 2022-05-22 VITALS — BP 126/62 | HR 80 | Temp 97.8°F | Resp 18 | Ht 69.0 in | Wt 246.2 lb

## 2022-05-22 DIAGNOSIS — I350 Nonrheumatic aortic (valve) stenosis: Secondary | ICD-10-CM | POA: Insufficient documentation

## 2022-05-22 DIAGNOSIS — Z01818 Encounter for other preprocedural examination: Secondary | ICD-10-CM | POA: Insufficient documentation

## 2022-05-22 DIAGNOSIS — Z20822 Contact with and (suspected) exposure to covid-19: Secondary | ICD-10-CM | POA: Diagnosis not present

## 2022-05-22 LAB — CBC
HCT: 42.6 % (ref 39.0–52.0)
Hemoglobin: 13.9 g/dL (ref 13.0–17.0)
MCH: 27.7 pg (ref 26.0–34.0)
MCHC: 32.6 g/dL (ref 30.0–36.0)
MCV: 84.9 fL (ref 80.0–100.0)
Platelets: 239 10*3/uL (ref 150–400)
RBC: 5.02 MIL/uL (ref 4.22–5.81)
RDW: 13.7 % (ref 11.5–15.5)
WBC: 6.6 10*3/uL (ref 4.0–10.5)
nRBC: 0 % (ref 0.0–0.2)

## 2022-05-22 LAB — COMPREHENSIVE METABOLIC PANEL
ALT: 25 U/L (ref 0–44)
AST: 21 U/L (ref 15–41)
Albumin: 3.9 g/dL (ref 3.5–5.0)
Alkaline Phosphatase: 47 U/L (ref 38–126)
Anion gap: 9 (ref 5–15)
BUN: 15 mg/dL (ref 8–23)
CO2: 26 mmol/L (ref 22–32)
Calcium: 9.5 mg/dL (ref 8.9–10.3)
Chloride: 106 mmol/L (ref 98–111)
Creatinine, Ser: 0.84 mg/dL (ref 0.61–1.24)
GFR, Estimated: >60 mL/min (ref >60)
Glucose, Bld: 204 mg/dL — ABNORMAL HIGH (ref 70–99)
Potassium: 3.9 mmol/L (ref 3.5–5.1)
Sodium: 141 mmol/L (ref 135–145)
Total Bilirubin: 0.8 mg/dL (ref 0.3–1.2)
Total Protein: 6.2 g/dL — ABNORMAL LOW (ref 6.5–8.1)

## 2022-05-22 LAB — URINALYSIS, ROUTINE W REFLEX MICROSCOPIC
Bilirubin Urine: NEGATIVE
Glucose, UA: NEGATIVE mg/dL
Hgb urine dipstick: NEGATIVE
Ketones, ur: NEGATIVE mg/dL
Leukocytes,Ua: NEGATIVE
Nitrite: NEGATIVE
Protein, ur: NEGATIVE mg/dL
Specific Gravity, Urine: 1.019 (ref 1.005–1.030)
pH: 5 (ref 5.0–8.0)

## 2022-05-22 LAB — PROTIME-INR
INR: 1 (ref 0.8–1.2)
Prothrombin Time: 13 seconds (ref 11.4–15.2)

## 2022-05-22 LAB — TYPE AND SCREEN
ABO/RH(D): A POS
Antibody Screen: NEGATIVE

## 2022-05-22 LAB — SURGICAL PCR SCREEN
MRSA, PCR: NEGATIVE
Staphylococcus aureus: NEGATIVE

## 2022-05-22 LAB — SARS CORONAVIRUS 2 (TAT 6-24 HRS): SARS Coronavirus 2: NEGATIVE

## 2022-05-22 NOTE — Progress Notes (Signed)
Patient ID: Mitchell Humphrey, male   DOB: 1941/04/17, 81 y.o.   MRN: [id number]  [facility]        [address]       [place] [address]             [place]  PCP is Inda Coke, [place] Referring Provider is Lauree Chandler, MD Primary Cardiologist is None  Reason for consultation: Severe aortic stenosis.  HPI:  The patient is a 81 year old gentleman with a history of hypertension, hyperlipidemia, hypothyroidism, and severe aortic stenosis who was referred for consideration of TAVR.  The patient reports a 6 to 51-monthhistory of progressive exertional dyspnea and fatigue.  He has had some mild left chest discomfort with pain into his left arm with ambulation.  He has had occasional episodes of dizziness but no syncope.  A 2D echocardiogram in February 2023 showed a mean gradient across the aortic valve of 26.5 mmHg with a peak gradient of 46 mmHg and a valve area of 1.21 cm.  Left ventricular ejection fraction was 60 to 65% with a stroke-volume index of 45.  The mean gradient across the aortic valve 1 year prior was 22 mmHg.  He underwent a cardiac scoring CT on 02/23/2022 which showed an aortic valve calcium score of 2428 consistent with severe aortic stenosis.  The coronary calcium score was 1599 which was 81st percentile.  The scan also showed areas of bilateral peripheral groundglass density with architectural distortion that were felt to be likely due to fibrosis from his prior COVID-19 pneumonia.  Cardiac catheterization showed mild nonobstructive coronary disease with mildly elevated LV filling pressure.  The patient is here today with his wife.  He retired as a [employer] trader  He was in the hospital for about 5 days in February 2022 with COVID-pneumonia and hypoxic respiratory failure.  He was on oxygen for a while at home.  Past Medical  History:  Diagnosis Date   Allergy    Arthritis    BPH (benign prostatic hyperplasia)    Cataract    Dyslipidemia    Erectile dysfunction    GERD (gastroesophageal reflux disease)    Hx of adenomatous colonic polyps    Hyperlipidemia    Hypertension    Hypogonadism male    Hypothyroid    Obesity    Severe aortic stenosis     Past Surgical History:  Procedure Laterality Date   APPENDECTOMY     COLONOSCOPY  08/2008   Dr. MWatt Climes  HERNIA REPAIR  1970   [facility]   RIGHT HEART CATH AND CORONARY ANGIOGRAPHY N/A 03/11/2022   Procedure: RIGHT HEART CATH AND CORONARY ANGIOGRAPHY;  Surgeon: MBurnell Blanks MD;  [facility];  Service: Cardiovascular;  Laterality: N/A;    Family History  Problem Relation Age of Onset   Breast cancer Mother    Lung cancer Father    Brain cancer Father    Breast cancer Sister    Heart attack Sister     Social History   Socioeconomic History   Marital status: Married    Spouse name: Not on file   Number of children: 3   Years of education: Not on file   Highest education level: Not on file  Occupational History   Occupation: sScientist, research (medical)  Occupation: Retired-Construction  Tobacco Use   Smoking status:  Never   Smokeless tobacco: Never   Tobacco comments:    He chews on a cigar  Vaping Use   Vaping Use: Never used  Substance and Sexual Activity   Alcohol use: No   Drug use: No   Sexual activity: Not Currently  Other Topics Concern   Not on file  Social History Narrative   Superintendent for construction site   Lives with wife   2 step children   2 sons   Social Determinants of Health   Financial Resource Strain: Not on file  Food Insecurity: Not on file  Transportation Needs: Not on file  Physical Activity: Not on file  Stress: Not on file  Social Connections: Not on file  Intimate Partner Violence: Not on file    Prior to Admission medications   Medication Sig Start Date End Date  Taking? Authorizing Provider  acetaminophen (TYLENOL) 500 MG tablet Take 1,000 mg by mouth every 6 (six) hours as needed for moderate pain.   Yes [provider]  acetaminophen-codeine (TYLENOL #3) 300-30 MG tablet Take 1 tablet by mouth every 6 (six) hours as needed for moderate pain. 04/03/22  Yes Inda Coke, PA  aspirin EC 81 MG tablet Take 81 mg by mouth daily. Swallow whole.   Yes [provider]  atorvastatin (LIPITOR) 20 MG tablet TAKE 1 TABLET (20 MG TOTAL) BY MOUTH DAILY. 05/06/22  Yes Inda Coke, PA  Cholecalciferol (VITAMIN D3) 125 MCG (5000 UT) CAPS Take 5,000 Units by mouth daily. Patient not taking: Reported on 05/21/2022   Yes [provider]  enalapril (VASOTEC) 10 MG tablet TAKE 1 TABLET (10 MG TOTAL) BY MOUTH DAILY. 05/06/22  Yes Worley, Aldona Bar, PA  finasteride (PROSCAR) 5 MG tablet TAKE 1 TABLET (5 MG TOTAL) BY MOUTH DAILY. 05/06/22  Yes Inda Coke, PA  GARCINIA CAMBOGIA-CHROMIUM PO Take 1,000 mg by mouth daily. Patient not taking: Reported on 05/21/2022   Yes [provider]  guaifenesin (HUMIBID E) 400 MG TABS tablet Take 400 mg by mouth daily.   Yes [provider]  hydrochlorothiazide (MICROZIDE) 12.5 MG capsule TAKE 1 CAPSULE (12.5 MG TOTAL) BY MOUTH DAILY. 05/18/22  Yes Inda Coke, PA  ipratropium (ATROVENT) 0.06 % nasal spray Place 2 sprays into both nostrils 4 (four) times daily. Patient taking differently: Place 2 sprays into both nostrils 4 (four) times daily as needed for rhinitis. 03/02/22  Yes Morene Rankins, Aldona Bar, PA  levothyroxine (SYNTHROID) 150 MCG tablet TAKE 1 TABLET EVERY MORNING PRIOR TO BREAKFAST 05/06/22  Yes Inda Coke, PA  loratadine (CLARITIN) 10 MG tablet Take 10 mg by mouth daily.   Yes [provider]  meloxicam (MOBIC) 15 MG tablet TAKE 1 TABLET (15 MG TOTAL) BY MOUTH DAILY. 05/06/22  Yes Inda Coke, PA  Multiple Vitamins-Minerals (IMMUNE SUPPORT PO) Take 1 tablet by mouth  daily. NHR Immune Protect Patient not taking: Reported on 05/21/2022   Yes [provider]  Polyethyl Glycol-Propyl Glycol (SYSTANE OP) Place 1 drop into both eyes daily as needed (irritation).   Yes [provider]  Saw Palmetto 500 MG CAPS Take 500 mg by mouth 2 (two) times daily. Patient not taking: Reported on 05/21/2022   Yes [provider]  tamsulosin (FLOMAX) 0.4 MG CAPS capsule TAKE 1 CAPSULE (0.4 MG TOTAL) BY MOUTH DAILY. 05/06/22  Yes Inda Coke, PA  vitamin C (ASCORBIC ACID) 500 MG tablet Take 500 mg by mouth daily. Patient not taking: Reported on 05/21/2022   Yes [provider]  diclofenac Sodium (VOLTAREN) 1 % GEL Apply 2 g topically daily as needed (pain).    [provider]  ELDERBERRY PO Take 1 capsule by mouth daily. Patient not taking: Reported on 05/21/2022    [provider]  Misc Natural Products [facility] PROSTATE) CAPS Take 1 capsule by mouth daily. Patient not taking: Reported on 05/21/2022    [provider]  vitamin B-12 (CYANOCOBALAMIN) 500 MCG tablet Take 500 mcg by mouth daily. Patient not taking: Reported on 05/21/2022    [provider]    Current Outpatient Medications  Medication Sig Dispense Refill   acetaminophen (TYLENOL) 500 MG tablet Take 1,000 mg by mouth every 6 (six) hours as needed for moderate pain.     acetaminophen-codeine (TYLENOL #3) 300-30 MG tablet Take 1 tablet by mouth every 6 (six) hours as needed for moderate pain. 30 tablet 0   aspirin EC 81 MG tablet Take 81 mg by mouth daily. Swallow whole.     atorvastatin (LIPITOR) 20 MG tablet TAKE 1 TABLET (20 MG TOTAL) BY MOUTH DAILY. 90 tablet 1   Cholecalciferol (VITAMIN D3) 125 MCG (5000 UT) CAPS Take 5,000 Units by mouth daily. (Patient not taking: Reported on 05/21/2022)     enalapril (VASOTEC) 10 MG tablet TAKE 1 TABLET (10 MG TOTAL) BY MOUTH DAILY. 90 tablet 1   finasteride (PROSCAR) 5 MG tablet TAKE 1 TABLET (5 MG TOTAL) BY  MOUTH DAILY. 90 tablet 1   GARCINIA CAMBOGIA-CHROMIUM PO Take 1,000 mg by mouth daily. (Patient not taking: Reported on 05/21/2022)     guaifenesin (HUMIBID E) 400 MG TABS tablet Take 400 mg by mouth daily.     hydrochlorothiazide (MICROZIDE) 12.5 MG capsule TAKE 1 CAPSULE (12.5 MG TOTAL) BY MOUTH DAILY. 90 capsule 1   ipratropium (ATROVENT) 0.06 % nasal spray Place 2 sprays into both nostrils 4 (four) times daily. (Patient taking differently: Place 2 sprays into both nostrils 4 (four) times daily as needed for rhinitis.) 15 mL 12   levothyroxine (SYNTHROID) 150 MCG tablet TAKE 1 TABLET EVERY MORNING PRIOR TO BREAKFAST 90 tablet 1   loratadine (CLARITIN) 10 MG tablet Take 10 mg by mouth daily.     meloxicam (MOBIC) 15 MG tablet TAKE 1 TABLET (15 MG TOTAL) BY MOUTH DAILY. 90 tablet 1   Multiple Vitamins-Minerals (IMMUNE SUPPORT PO) Take 1 tablet by mouth daily. NHR Immune Protect (Patient not taking: Reported on 05/21/2022)     Polyethyl Glycol-Propyl Glycol (SYSTANE OP) Place 1 drop into both eyes daily as needed (irritation).     Saw Palmetto 500 MG CAPS Take 500 mg by mouth 2 (two) times daily. (Patient not taking: Reported on 05/21/2022)     tamsulosin (FLOMAX) 0.4 MG CAPS capsule TAKE 1 CAPSULE (0.4 MG TOTAL) BY MOUTH DAILY. 90 capsule 1   vitamin C (ASCORBIC ACID) 500 MG tablet Take 500 mg by mouth daily. (Patient not taking: Reported on 05/21/2022)     diclofenac Sodium (VOLTAREN) 1 % GEL Apply 2 g topically daily as needed (pain).     ELDERBERRY PO Take 1 capsule by mouth daily. (Patient not taking: Reported on 05/21/2022)     Misc Natural Products [facility] PROSTATE) CAPS Take 1 capsule by mouth daily. (Patient not taking: Reported on 05/21/2022)     vitamin B-12 (CYANOCOBALAMIN) 500 MCG tablet Take 500 mcg by mouth daily. (Patient not taking: Reported on 05/21/2022)     No current facility-administered medications for this visit.    Allergies  Allergen Reactions  Penicillins Rash      Review  of Systems:   General:  normal appetite, + decreased energy, + weight gain, no weight loss, no fever  Cardiac:  + chest pain with exertion, no chest pain at rest, + SOB with mild exertion, no resting SOB, no PND, no orthopnea, no palpitations, no arrhythmia, no atrial fibrillation, no LE edema, + dizzy spells, no syncope  Respiratory:  + exertional shortness of breath, no home oxygen, no productive cough, + dry cough, no bronchitis, no wheezing, no hemoptysis, no asthma, no pain with inspiration or cough, no sleep apnea, no CPAP at night  GI:   no difficulty swallowing, no reflux, no frequent heartburn, no hiatal hernia, no abdominal pain, no constipation, no diarrhea, no hematochezia, no hematemesis, no melena  GU:   no dysuria,  + frequency, no urinary tract infection, no hematuria, no enlarged prostate, no kidney stones, no kidney disease  Vascular:  + pain suggestive of claudication, no pain in feet, no leg cramps, no varicose veins, no DVT, no non-healing foot ulcer  Neuro:   no stroke, no TIA's, no seizures, no headaches, no temporary blindness one eye,  no slurred speech, no peripheral neuropathy, no chronic pain, no instability of gait, + memory/cognitive dysfunction  Musculoskeletal: + arthritis, no joint swelling, no myalgias, no difficulty walking, normal mobility   Skin:   no rash, no itching, no skin infections, no pressure sores or ulcerations  Psych:   no anxiety, no depression, no nervousness, no unusual recent stress  Eyes:   no blurry vision, + floaters, + recent vision changes, + wears glasses  ENT:   + hearing loss, no loose or painful teeth, no dentures, last saw dentist 05/05/2022  Hematologic:  + easy bruising, no abnormal bleeding, no clotting disorder, no frequent epistaxis  Endocrine:  no diabetes, does not check CBG's at home     Physical Exam:   BP (!) 160/74   Pulse 87   Resp 20   Ht '5\' 10"'$  (1.778 m)   Wt 252 lb (114.3 kg)   SpO2 97% Comment: RA  BMI 36.16 kg/m    General:  Elderly,  well-appearing  HEENT:  Unremarkable, NCAT, PERLA, EOMI  Neck:   no JVD, no bruits, no adenopathy   Chest:   clear to auscultation, symmetrical breath sounds, no wheezes, no rhonchi   CV:   RRR, 3/6 systolic murmur RSB, no diastolic murmur  Abdomen:  soft, non-tender, no masses   Extremities:  warm, well-perfused, pulses palpable in ankles, no lower extremity edema  Rectal/GU  Deferred  Neuro:   Grossly non-focal and symmetrical throughout  Skin:   Clean and dry, no rashes, no breakdown  Diagnostic Tests:   ECHOCARDIOGRAM REPORT         Patient Name:   Mitchell Humphrey  Date of Exam: 12/26/2021  Medical Rec #:  [id number]     Height:       68.5 in  Accession #:    [id number]    Weight:       248.0 lb  Date of Birth:  [birth year]    BSA:          2.252 m  Patient Age:    44 years      BP:           142/88 mmHg  Patient Gender: M             HR:  78 bpm.  Exam Location:  [address]   Procedure: 2D Echo, Cardiac Doppler and Color Doppler   Indications:    R07.9 Chest pain     History:        Patient has prior history of Echocardiogram examinations,  most                  recent 12/26/2020. DVT; Risk Factors:Hypertension,  Dyslipidemia                  and Former Smoker. Morbid obesity. COVID-19.     Sonographer:    Basilia Jumbo BS, RDCS  Referring Phys: [id number] [place]     1. Left ventricular ejection fraction, by estimation, is 60 to 65%. The  left ventricle has normal function. The left ventricle has no regional  wall motion abnormalities. Left ventricular diastolic parameters were  normal.   2. Right ventricular systolic function is normal. The right ventricular  size is normal. Tricuspid regurgitation signal is inadequate for assessing  PA pressure.   3. Left atrial size was moderately dilated.   4. The mitral valve is normal in structure. Mild mitral valve  regurgitation. No evidence of mitral stenosis.   5.  The aortic valve was not well visualized. There is severe calcifcation  of the aortic valve. Aortic valve regurgitation is not visualized.  Moderate to severe aortic valve stenosis. Vmax 3.5 m/s, MG 83mHg, AVA 1.4  cm^2, DI 0.22   Comparison(s): 12/26/20 EF 60-65%. Moderate AS 215mg mean PG, 3276m peak  PG.   FINDINGS   Left Ventricle: Left ventricular ejection fraction, by estimation, is 60  to 65%. The left ventricle has normal function. The left ventricle has no  regional wall motion abnormalities. The left ventricular internal cavity  size was normal in size. There is   no left ventricular hypertrophy. Left ventricular diastolic parameters  were normal.   Right Ventricle: The right ventricular size is normal. No increase in  right ventricular wall thickness. Right ventricular systolic function is  normal. Tricuspid regurgitation signal is inadequate for assessing PA  pressure.   Left Atrium: Left atrial size was moderately dilated.   Right Atrium: Right atrial size was normal in size.   Pericardium: Trivial pericardial effusion is present.   Mitral Valve: The mitral valve is normal in structure. Mild mitral valve  regurgitation. No evidence of mitral valve stenosis.   Tricuspid Valve: The tricuspid valve is normal in structure. Tricuspid  valve regurgitation is not demonstrated.   Aortic Valve: The aortic valve was not well visualized. There is severe  calcifcation of the aortic valve. Aortic valve regurgitation is not  visualized. Moderate to severe aortic stenosis is present. Aortic valve  mean gradient measures 26.5 mmHg. Aortic  valve peak gradient measures 46.0 mmHg. Aortic valve area, by VTI measures  1.21 cm.   Pulmonic Valve: The pulmonic valve was not well visualized. Pulmonic valve  regurgitation is not visualized.   Aorta: The aortic root and ascending aorta are structurally normal, with  no evidence of dilitation.   IAS/Shunts: The interatrial septum  was not well visualized.      LEFT VENTRICLE  PLAX 2D  LVIDd:         4.70 cm   Diastology  LVIDs:         3.20 cm   LV e' medial:    7.04 cm/s  LV PW:         1.00 cm  LV E/e' medial:  14.8  LV IVS:        1.00 cm   LV e' lateral:   9.96 cm/s  LVOT diam:     2.80 cm   LV E/e' lateral: 10.4  LV SV:         101  LV SV Index:   45  LVOT Area:     6.16 cm      RIGHT VENTRICLE  RV Basal diam:  3.70 cm  RV S prime:     13.70 cm/s  TAPSE (M-mode): 2.5 cm   LEFT ATRIUM              Index        RIGHT ATRIUM           Index  LA diam:        4.50 cm  2.00 cm/m   RA Area:     20.30 cm  LA Vol (A2C):   115.0 ml 51.07 ml/m  RA Volume:   57.40 ml  25.49 ml/m  LA Vol (A4C):   101.0 ml 44.86 ml/m  LA Biplane Vol: 109.0 ml 48.41 ml/m   AORTIC VALVE  AV Area (Vmax):    1.24 cm  AV Area (Vmean):   1.30 cm  AV Area (VTI):     1.21 cm  AV Vmax:           339.00 cm/s  AV Vmean:          225.200 cm/s  AV VTI:            0.840 m  AV Peak Grad:      46.0 mmHg  AV Mean Grad:      26.5 mmHg  LVOT Vmax:         68.27 cm/s  LVOT Vmean:        47.467 cm/s  LVOT VTI:          0.164 m  LVOT/AV VTI ratio: 0.20     AORTA  Ao Root diam: 3.70 cm  Ao Asc diam:  3.60 cm   MITRAL VALVE                              SHUNTS  MV Decel Time: 197 msec     Systemic VTI:  0.16 m  MV E velocity: 104.00 cm/s  Systemic Diam: 2.80 cm  MV A velocity: 80.20 cm/s  MV E/A ratio:  1.30   Oswaldo Milian MD  Electronically signed by Oswaldo Milian MD  Signature Date/Time: 12/26/2021/1:46:22 PM         Final     Physicians  Panel Physicians Referring Physician Case Authorizing Physician  Burnell Blanks, MD (Primary)     Procedures  RIGHT HEART CATH AND CORONARY ANGIOGRAPHY   Conclusion      Mid RCA lesion is 20% stenosed.   1st Mrg lesion is 20% stenosed.   Mid LAD lesion is 20% stenosed.   Mild non-obstructive CAD Mild elevation LV filling pressure and wedge  pressure   Recommendations: Medical management of CAD. Will have him increase his HCTZ to 25 mg daily. Continue workup for TAVR   Indications  Severe aortic stenosis [I35.0 (ICD-10-CM)]   Procedural Details  Technical Details Indication: Severe AS, TAVR workup  Procedure: The risks, benefits, complications, treatment options, and expected outcomes were discussed with the patient. The patient and/or family concurred with the proposed plan, giving  informed consent. The patient was brought to the cath lab after IV hydration was given. The patient was sedated with Versed and Fentanyl. The IV catheter in the right antecubital vein was changed for a 5 [place] sheath. Right heart catheterization performed with a balloon tipped catheter. The right wrist was prepped and draped in a sterile fashion. 1% lidocaine was used for local anesthesia. Using the modified Seldinger access technique, a 5 French sheath was placed in the right radial artery. 3 mg Verapamil was given through the sheath. Weight based IV heparin was given. Standard diagnostic catheters were used to perform selective coronary angiography. I did not cross the aortic valve. All catheter exchanges were performed over an exchange length guidewire.   The sheath was removed from the right radial artery and a Terumo hemostasis band was applied at the arteriotomy site on the right wrist.      Estimated blood loss <50 mL.   During this procedure medications were administered to achieve and maintain moderate conscious sedation while the patient's heart rate, blood pressure, and oxygen saturation were continuously monitored and I was present face-to-face 100% of this time.     Medications (Filter: Administrations occurring from 1114 to 1217 on 03/11/22)  important  Continuous medications are totaled by the amount administered until 03/11/22 1217.   Heparin (Porcine) in NaCl 1000-0.9 UT/500ML-% SOLN (mL) Total volume:  1,000 mL  Date/Time  Rate/Dose/Volume Action   03/11/22 1127 500 mL Given   1127 500 mL Given    fentaNYL (SUBLIMAZE) injection (mcg) Total dose:  25 mcg  Date/Time Rate/Dose/Volume Action   03/11/22 1134 25 mcg Given    midazolam (VERSED) injection (mg) Total dose:  1 mg  Date/Time Rate/Dose/Volume Action   03/11/22 1134 1 mg Given    lidocaine (PF) (XYLOCAINE) 1 % injection (mL) Total volume:  17 mL  Date/Time Rate/Dose/Volume Action   03/11/22 1142 2 mL Given   1144 15 mL Given    Radial Cocktail/Verapamil only (mL) Total volume:  10 mL  Date/Time Rate/Dose/Volume Action   03/11/22 1143 10 mL Given    heparin sodium (porcine) injection (Units) Total dose:  5,000 Units  Date/Time Rate/Dose/Volume Action   03/11/22 1154 5,000 Units Given    iohexol (OMNIPAQUE) 350 MG/ML injection (mL) Total volume:  60 mL  Date/Time Rate/Dose/Volume Action   03/11/22 1204 60 mL Given    Sedation Time  Sedation Time Physician-1: 29 minutes 56 seconds Contrast  Medication Name Total Dose  iohexol (OMNIPAQUE) 350 MG/ML injection 60 mL   Radiation/Fluoro  Fluoro time: 8.6 (min) DAP: 34423 (mGycm2) Cumulative Air Kerma: 829 (mGy) Complications  Complications documented before study signed (03/11/2022 93:71 PM)   No complications were associated with this study.  Documented by Leone Payor D - 03/11/2022 12:10 PM     Coronary Findings  Diagnostic Dominance: Right Left Anterior Descending  Vessel is large.  Mid LAD lesion is 20% stenosed.    Left Circumflex  Vessel is moderate in size.    First Obtuse Marginal Branch  1st Mrg lesion is 20% stenosed.    Right Coronary Artery  Vessel is large.  Mid RCA lesion is 20% stenosed.    Intervention   No interventions have been documented.   Coronary Diagrams  Diagnostic Dominance: Right  Intervention  Implants     No implant documentation for this case.   Syngo Images   Show images for CARDIAC CATHETERIZATION Images on  Long Term Storage   Show images for  Ozella Almond Link to Procedure Log  Procedure Log    Hemo Data  Flowsheet Row Most Recent Value  Fick Cardiac Output 6.76 L/min  Fick Cardiac Output Index 2.97 (L/min)/BSA  RA A Wave 10 mmHg  RA V Wave 8 mmHg  RA Mean 7 mmHg  RV Systolic Pressure 43 mmHg  RV Diastolic Pressure 3 mmHg  RV EDP 12 mmHg  PA Systolic Pressure 43 mmHg  PA Diastolic Pressure 18 mmHg  PA Mean 27 mmHg  PW A Wave 30 mmHg  PW V Wave 30 mmHg  PW Mean 22 mmHg  AO Systolic Pressure 778 mmHg  AO Diastolic Pressure 72 mmHg  AO Mean 110 mmHg  QP/QS 1  TPVR Index 9.08 HRUI  TSVR Index 37 HRUI  PVR SVR Ratio 0.05  TPVR/TSVR Ratio 0.25    ADDENDUM REPORT: 03/28/2022 16:20   CLINICAL DATA:  Severe Aortic Stenosis.   EXAM: Cardiac TAVR CT   TECHNIQUE: A non-contrast, gated CT scan was obtained with axial slices of 3 mm through the heart for aortic valve calcium scoring. A 120 kV retrospective, gated, contrast cardiac scan was obtained. Gantry rotation speed was 250 msecs and collimation was 0.6 mm. Nitroglycerin was not given. The 3D data set was reconstructed in 5% intervals of the 0-95% of the R-R cycle. Systolic and diastolic phases were analyzed on a dedicated workstation using MPR, MIP, and VRT modes. The patient received 100 cc of contrast.   FINDINGS: Image quality: Excellent.   Noise artifact is: Limited.   Valve Morphology: Tricuspid aortic valve with diffuse severe calcifications. Severely restricted leaflet motion in systole.   Aortic Valve Calcium score: 1862   Aortic annular dimension:   Phase assessed: 30%   Annular area: 601 mm2   Annular perimeter: 89.0 mm   Max diameter: 30.2 mm   Min diameter: 26.3 mm   Annular and subannular calcification: None.   Membranous septum length: 11.5 mm   Optimal coplanar projection: RAO 4 CAU 7   Coronary Artery Height above Annulus:   Left Main: 13.0 mm   Right Coronary: 19.5 mm   Sinus  of Valsalva Measurements:   Non-coronary: 32 mm   Right-coronary: 35 mm   Left-coronary: 34 mm   Sinus of Valsalva Height:   Non-coronary: 22.8 mm   Right-coronary: 23.8 mm   Left-coronary: 22.0 mm   Sinotubular Junction: 31 mm   Ascending Thoracic Aorta: 35 mm   Coronary Arteries: Normal coronary origin. Right dominance. The study was performed without use of NTG and is insufficient for plaque evaluation. Please refer to recent cardiac catheterization for coronary assessment.   Cardiac Morphology:   Right Atrium: Right atrial size is within normal limits.   Right Ventricle: The right ventricular cavity is within normal limits.   Left Atrium: Left atrial size is normal in size with no left atrial appendage filling defect.   Left Ventricle: The ventricular cavity size is within normal limits. There are no stigmata of prior infarction.   Pulmonary arteries: Mild dilation of the main pulmonary artery suggestive of pulmonary hypertension.   Pulmonary veins: Normal pulmonary venous drainage.   Pericardium: Normal thickness with no significant effusion or calcium present.   Mitral Valve: The mitral valve is normal structure without significant calcification.   Extra-cardiac findings: See attached radiology report for non-cardiac structures.   IMPRESSION: 1. Annular measurements support a 29 mm S3 (601 mm2).   2. No significant annular or subannular calcifications.   3. Sufficient coronary  to annulus distance.   4. Optimal Fluoroscopic Angle for Delivery: RAO 4 CAU 7   5. Mild dilation of the main pulmonary artery suggestive of pulmonary hypertension.   [place] T. Audie Box, MD     Electronically Signed   By: Eleonore Chiquito M.D.   On: 03/28/2022 16:20    Addended by Geralynn Rile, MD on 03/28/2022  4:22 PM   Study Result  Narrative & Impression  EXAM: OVER-READ INTERPRETATION  CT CHEST   The following report is a limited chest CT over-read  performed by radiologist Dr. Yetta Glassman of [facility], [place] on 03/27/2022. This over-read does not include interpretation of cardiac or coronary anatomy or pathology This over-read does not include interpretation of cardiac or coronary anatomy or pathology. The cardiac CTA interpretation by the cardiologist is attached.   COMPARISON:  None Available.   FINDINGS: Extracardiac findings will be described separately under dictation for contemporaneously obtained CTA chest, abdomen and pelvis.   IMPRESSION: Please see separate dictation for contemporaneously obtained CTA chest, abdomen and pelvis dated 03/27/2022 for full description of relevant extracardiac findings.   Electronically Signed: By: Yetta Glassman M.D. On: 03/27/2022 11:26      Narrative & Impression  CLINICAL DATA:  Preop evaluation for aortic valve replacement   EXAM: CT ANGIOGRAPHY CHEST, ABDOMEN AND PELVIS   TECHNIQUE: Multidetector CT imaging through the chest, abdomen and pelvis was performed using the standard protocol during bolus administration of intravenous contrast. Multiplanar reconstructed images and MIPs were obtained and reviewed to evaluate the vascular anatomy.   RADIATION DOSE REDUCTION: This exam was performed according to the departmental dose-optimization program which includes automated exposure control, adjustment of the mA and/or kV according to patient size and/or use of iterative reconstruction technique.   CONTRAST:  161m OMNIPAQUE IOHEXOL 350 MG/ML SOLN   COMPARISON:  None Available.   FINDINGS: CTA CHEST FINDINGS   Cardiovascular: Normal heart size. No pericardial effusion. Left main and three-vessel coronary artery calcifications. Aortic valve thickening and calcifications. Normal caliber thoracic aorta with moderate calcified and noncalcified plaque. No suspicious filling defects of the central pulmonary arteries. Standard three-vessel aortic arch with no  significant stenosis.   Mediastinum/Nodes: Thyroid is unremarkable. Small hiatal hernia. No pathologically enlarged lymph nodes seen in the chest.   Lungs/Pleura: Central airways are patent. Mild bilateral ground-glass opacities and peribronchovascular reticular opacities. No consolidation, pleural effusion or pneumothorax.   Musculoskeletal: No chest wall abnormality. No acute or significant osseous findings.   CTA ABDOMEN AND PELVIS FINDINGS   Hepatobiliary: No focal liver abnormality is seen. No gallstones, gallbladder wall thickening, or biliary dilatation.   Pancreas: Unremarkable. No pancreatic ductal dilatation or surrounding inflammatory changes.   Spleen: Normal in size without focal abnormality.   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 Left adrenal gland is unremarkable. No hydronephrosis or nephrolithiasis. Bladder is unremarkable.   Stomach/Bowel: Stomach is within normal limits. Appendix is not visualized. Diverticulosis. No evidence of bowel wall thickening, distention, or inflammatory changes.   Vascular/lymphatic: No pathologically enlarged lymph nodes seen in the chest. Normal caliber abdominal aorta with moderate calcified and noncalcified plaque. Major branch vessels are patent. Mild narrowing at the origin of the IMA due to calcified and noncalcified plaque, otherwise no evidence of significant stenosis.   Reproductive: Prostatomegaly.   Other: Small bilateral fat containing inguinal hernias.   Musculoskeletal: No acute or significant osseous findings.  VASCULAR MEASUREMENTS PERTINENT TO TAVR:   AORTA:   Minimal Aortic Diameter-15.5 mm   Severity of Aortic Calcification-moderate   RIGHT PELVIS:   Right Common Iliac Artery -   Minimal Diameter-7.7 mm   Tortuosity-mild   Calcification-moderate   Right External Iliac Artery -    Minimal Diameter-8.6 mm   Tortuosity-moderate   Calcification-none   Right Common Femoral Artery -   Minimal Diameter-3.3 mm   Tortuosity-none   Calcification-severe   LEFT PELVIS:   Left Common Iliac Artery -   Minimal Diameter-5.0 mm   Tortuosity-mild   Calcification-severe   Left External Iliac Artery -   Minimal Diameter-8.2 mm   Tortuosity-none   Calcification-none   Left Common Femoral Artery -   Minimal Diameter-6.4 mm   Tortuosity-none   Calcification-mild   Review of the MIP images confirms the above findings.   IMPRESSION: 1. Vascular findings and measurements pertinent to potential TAVR procedure, as detailed above. 2. Severe thickening calcification of the aortic valve, compatible with reported clinical history of severe aortic stenosis. 3. 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 Intraluminal calcification of the right common femoral artery causing severe narrowing. 4. Left main and 3 vessel coronary artery disease. 5. Mild bilateral ground-glass opacities and peribronchovascular reticular opacities, likely sequela of prior COVID-19 infection.     Electronically Signed   By: Yetta Glassman M.D.   On: 03/27/2022 12:27      Impression:  This 81 year old gentleman has stage D3, severe, symptomatic aortic stenosis with [place] Heart Association class III symptoms of exertional fatigue and shortness of breath consistent with chronic diastolic congestive heart failure.  I have personally reviewed his 2D echocardiogram, cardiac catheterization, and CTA studies.  Echocardiogram shows a severely thickened and calcified aortic valve with restricted leaflet mobility.  The mean gradient was 27 mmHg with a dimensionless index of 0.22 and a valve area measured at 1.4 cm.  His gated cardiac CTA shows a trileaflet aortic valve with severe thickening and calcification with restricted leaflet mobility.  His aortic valve calcium score was  consistent with severe aortic stenosis.  Cardiac catheterization shows mild nonobstructive coronary disease.  I agree that aortic valve replacement is indicated in this patient for relief of his symptoms and to prevent left ventricular dysfunction.  Given his advanced age I think that transcatheter aortic valve replacement would be the best option for him.  His gated cardiac CTA shows anatomy suitable for TAVR using a SAPIEN 3 valve.  His abdominal and pelvic CTA shows adequate pelvic vascular anatomy to allow transfemoral insertion.  There is some intraluminal calcification in the right common femoral artery but we should be able to get in above that.  The patient and his wife were counseled at length regarding treatment alternatives for management of severe symptomatic aortic stenosis. The risks and benefits of surgical intervention has been discussed in detail. Long-term prognosis with medical therapy was discussed. Alternative approaches such as conventional surgical aortic valve replacement, transcatheter aortic valve replacement, and palliative medical therapy were compared and contrasted at length. This discussion was placed in the context of the patient's own specific clinical presentation and past medical history. All of their questions have been addressed.   Following the decision to proceed with transcatheter aortic valve replacement, a discussion was held regarding what types of management strategies would be attempted intraoperatively in the event of life-threatening complications, including whether or not the patient would be considered a candidate for the use of cardiopulmonary bypass and/or conversion to  open sternotomy for attempted surgical intervention.  I think he would be a candidate for emergent sternotomy to manage any intraoperative complications.  Although he had fairly severe COVID-pneumonia his recent pulmonary function testing is normal.  The patient is aware of the fact that  transient use of cardiopulmonary bypass may be necessary. The patient has been advised of a variety of complications that might develop including but not limited to risks of death, stroke, paravalvular leak, aortic dissection or other major vascular complications, aortic annulus rupture, device embolization, cardiac rupture or perforation, mitral regurgitation, acute myocardial infarction, arrhythmia, heart block or bradycardia requiring permanent pacemaker placement, congestive heart failure, respiratory failure, renal failure, pneumonia, infection, other late complications related to structural valve deterioration or migration, or other complications that might ultimately cause a temporary or permanent loss of functional independence or other long term morbidity. The patient provides full informed consent for the procedure as described and all questions were answered.      Plan:  He will be scheduled for transfemoral TAVR using a SAPIEN 3 valve on 05/26/2022.  I spent 60 minutes performing this consultation and > 50% of this time was spent face to face counseling and coordinating the care of this patient's severe aortic stenosis.   Gaye Pollack, MD 05/20/2022

## 2022-05-22 NOTE — Progress Notes (Signed)
PCP - Inda Coke, PA Cardiologist - Dr. Eleonore Chiquito  PPM/ICD - denies   Chest x-ray - 05/22/22 EKG - 05/22/22 Stress Test - 01/10/21 ECHO - 12/26/21 Cardiac Cath - 03/08/22  Sleep Study - denies   DM- denies  Blood Thinner Instructions: n/a Aspirin Instructions: continue thru day before surgery  ERAS Protcol - no, NPO   COVID TEST- 05/22/22   Anesthesia review: yes, cardiac hx  Patient denies shortness of breath, fever, cough and chest pain at PAT appointment   All instructions explained to the patient, with a verbal understanding of the material. Patient agrees to go over the instructions while at home for a better understanding. Patient also instructed to wear a mask in public after being tested for COVID-19. The opportunity to ask questions was provided.

## 2022-05-25 ENCOUNTER — Encounter (HOSPITAL_COMMUNITY): Payer: Self-pay

## 2022-05-25 MED ORDER — NOREPINEPHRINE 4 MG/250ML-% IV SOLN
0.0000 ug/min | INTRAVENOUS | Status: DC
  Filled 2022-05-25: qty 250

## 2022-05-25 MED ORDER — HEPARIN 30,000 UNITS/1000 ML (OHS) CELLSAVER SOLUTION
Status: DC
  Filled 2022-05-25 (×2): qty 1000

## 2022-05-25 MED ORDER — MAGNESIUM SULFATE 50 % IJ SOLN
40.0000 meq | INTRAMUSCULAR | Status: DC
  Filled 2022-05-25 (×2): qty 9.85

## 2022-05-25 MED ORDER — DEXMEDETOMIDINE HCL IN NACL 400 MCG/100ML IV SOLN
0.1000 ug/kg/h | INTRAVENOUS | Status: AC
  Administered 2022-05-26: 1 ug/kg/h via INTRAVENOUS
  Filled 2022-05-25 (×2): qty 100

## 2022-05-25 MED ORDER — POTASSIUM CHLORIDE 2 MEQ/ML IV SOLN
80.0000 meq | INTRAVENOUS | Status: DC
  Filled 2022-05-25 (×2): qty 40

## 2022-05-25 MED ORDER — VANCOMYCIN HCL 1500 MG/300ML IV SOLN
1500.0000 mg | INTRAVENOUS | Status: AC
  Administered 2022-05-26: 1500 mg via INTRAVENOUS
  Filled 2022-05-25 (×2): qty 300

## 2022-05-25 NOTE — Anesthesia Preprocedure Evaluation (Addendum)
Anesthesia Evaluation  Patient identified by MRN, date of birth, ID band Patient awake    Reviewed: Allergy & Precautions, NPO status , Patient's Chart, lab work & pertinent test results  Airway Mallampati: II  TM Distance: >3 FB Neck ROM: Full    Dental no notable dental hx.    Pulmonary neg pulmonary ROS,    Pulmonary exam normal breath sounds clear to auscultation       Cardiovascular hypertension, Pt. on medications + DVT  + Valvular Problems/Murmurs AS  Rhythm:Regular Rate:Normal + Systolic murmurs    Neuro/Psych negative neurological ROS  negative psych ROS   GI/Hepatic negative GI ROS, Neg liver ROS,   Endo/Other  Hypothyroidism   Renal/GU negative Renal ROS     Musculoskeletal  (+) Arthritis ,   Abdominal   Peds  Hematology negative hematology ROS (+)   Anesthesia Other Findings Severe Aortic Stenosis  Reproductive/Obstetrics                            Anesthesia Physical Anesthesia Plan  ASA: 4  Anesthesia Plan: MAC   Post-op Pain Management:    Induction: Intravenous  PONV Risk Score and Plan: 1 and Ondansetron, Dexamethasone and Treatment may vary due to age or medical condition  Airway Management Planned: Simple Face Mask  Additional Equipment: Arterial line  Intra-op Plan:   Post-operative Plan:   Informed Consent: I have reviewed the patients History and Physical, chart, labs and discussed the procedure including the risks, benefits and alternatives for the proposed anesthesia with the patient or authorized representative who has indicated his/her understanding and acceptance.     Dental advisory given  Plan Discussed with: CRNA  Anesthesia Plan Comments: (PAT note written 05/25/2022 by Shonna Chock, PA-C. )       Anesthesia Quick Evaluation

## 2022-05-25 NOTE — Progress Notes (Signed)
Anesthesia Chart Review:  Case: [id number] Date/Time: 05/26/22 1200   Procedures:      Transcatheter Aortic Valve Replacement, Transfemoral     INTRAOPERATIVE TRANSTHORACIC ECHOCARDIOGRAM   Anesthesia type: Monitor Anesthesia Care   Pre-op diagnosis: Severe Aortic Stenosis   Location: [facility] / [name]   Providers: Burnell Blanks, MD     CT Surgeon: Mitchell Raid, MD    DISCUSSION: Patient is a 81 year old male scheduled for the above procedure.  History includes never smoker, HTN, HLD, CAD (mild non-obstructive 02/2022), aortic stenosis, GERD, hypothyroidism, COVID PNA (11/2020, required home O2 for a while, had residual lung scarring on imaging),  DVT (left gastrocnemius vein 12/17/19 in setting of COVID), PAD (moderate-severe aortoiliac atherosclerosis, severe right CFA stenosis 03/27/22 CTA).   Per 03/27/22 pulmonology note by Dr. Elsworth Soho, "He has minimal residual scarring on his CT from 02/2022 which is likely related to COVID-pneumonia.  I do not think he has another form of ILD.  His exercise tolerance appears to be good and he is able to walk 1.5 miles daily. We will obtain PFTs to quantitate lung function.  He does not desaturate on ambulation today. We will clear him for TAVR surgery with due risk from a pulmonary standpoint unless pulmonary function test shows severe restriction." He had normal pulmonary function on 04/27/22 PFTs.   05/22/2022 presurgical COVID-19 test negative.  Non-fasting glucose was 204 on 05/22/22. His last A1c in the pre-diabetic range at 6.4% on 01/08/22 (ordered by Dr. Audie Box), although it had been up to 7.2% on 01/06/21 in setting of COVID-19.  Will order a CBG on arrival. Anesthesia team to evaluate on the day of surgery.   VS: BP 126/62   Pulse 80   Temp 36.6 C (Oral)   Resp 18   Ht '5\' 9"'$  (1.753 m)   Wt 111.7 kg   SpO2 97%   BMI 36.36 kg/m   PROVIDERS: Inda Coke, PA is PCP  Eleonore Chiquito, MD is primary cardiologist Lauree Chandler, MD is structural heart cardiologist Kara Mead, MD is pulmonologist   LABS: Preoperative labs noted. See DISCUSSION. (all labs ordered are listed, but only abnormal results are displayed)  Labs Reviewed  URINALYSIS, ROUTINE W REFLEX MICROSCOPIC - Abnormal; Notable for the following components:      Result Value   APPearance HAZY (*)    All other components within normal limits  COMPREHENSIVE METABOLIC PANEL - Abnormal; Notable for the following components:   Glucose, Bld 204 (*)    Total Protein 6.2 (*)    All other components within normal limits  SURGICAL PCR SCREEN  SARS CORONAVIRUS 2 (TAT 6-24 HRS)  CBC  PROTIME-INR  TYPE AND SCREEN    OTHER:  PFTs 04/27/22: FVC 3.86 (98%), post 3.95 (101%). FEV1 3.28 (117%), post 3.46 (124%). DLCO unc 21.33 (89%), cor 21.97 (92%). Normal pulmonary function.   Overnight pulse oximetry 02/12/21-02/13/21 (post COVID PNA 11/2020): Qualified - Group 1. SpO2 or less for at least 5 cumulative minutes. Duration < 88% was 37 minutes.    IMAGES: CXR 05/22/22: FINDINGS: The heart size and mediastinal contours are within normal limits. There is some scarring in the lung bases. The lungs are otherwise clear. There is no pleural effusion or pneumothorax. Visualized skeletal structures are unremarkable. There are surgical clips in the upper abdomen.  CTA Chest/abd/pelvis 03/27/22: IMPRESSION: 1. Vascular findings and measurements pertinent to potential TAVR procedure, as detailed above. 2. Severe thickening calcification of the aortic  valve, compatible with reported clinical history of severe aortic stenosis. 3. 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 Intraluminal calcification of the right common femoral artery causing severe narrowing. 4. Left main and 3 vessel coronary artery disease. 5. Mild bilateral ground-glass opacities and peribronchovascular reticular opacities, likely sequela of prior COVID-19 infection.    EKG:  05/22/22: Normal sinus rhythm Minimal voltage criteria for LVH, may be normal variant ( R in aVL ) Nonspecific T wave abnormality Abnormal ECG When compared with ECG of 15-Dec-2020 09:49, PREVIOUS ECG IS PRESENT Since last tracing rate slower Confirmed by Larae Grooms [phone]) on 05/23/2022 9:51:12 AM   CV: RHC/LHC 03/11/22:   Mid RCA lesion is 20% stenosed.   1st Mrg lesion is 20% stenosed.   Mid LAD lesion is 20% stenosed.   Mild non-obstructive CAD Mild elevation LV filling pressure and wedge pressure Recommendations: Medical management of CAD. Will have him increase his HCTZ to 25 mg daily. Continue workup for TAVR   Echo 12/26/21: IMPRESSIONS   1. Left ventricular ejection fraction, by estimation, is 60 to 65%. The  left ventricle has normal function. The left ventricle has no regional  wall motion abnormalities. Left ventricular diastolic parameters were  normal.   2. Right ventricular systolic function is normal. The right ventricular  size is normal. Tricuspid regurgitation signal is inadequate for assessing  PA pressure.   3. Left atrial size was moderately dilated.   4. The mitral valve is normal in structure. Mild mitral valve  regurgitation. No evidence of mitral stenosis.   5. The aortic valve was not well visualized. There is severe calcifcation  of the aortic valve. Aortic valve regurgitation is not visualized.  Moderate to severe aortic valve stenosis. Vmax 3.5 m/s, MG 40mHg, AVA 1.4  cm^2, DI 0.22  - Comparison(s): 12/26/20 EF 60-65%. Moderate AS 228mg mean PG, 3252m peak  PG.   Past Medical History:  Diagnosis Date   Allergy    Arthritis    BPH (benign prostatic hyperplasia)    Cataract    DV[place]deep [place] thrombosis) [place]   left gastrocnemius DVT in setting of COVID-19   Dyslipidemia    Erectile dysfunction    GERD (gastroesophageal reflux disease)    Hx of adenomatous colonic polyps    Hyperlipidemia    Hypertension     Hypogonadism male    Hypothyroid    Obesity    Severe aortic stenosis     Past Surgical History:  Procedure Laterality Date   APPENDECTOMY     COLONOSCOPY  08/2008   Dr. MagWatt ClimesHERNIA REPAIR  1970   [facility]   RIGHT HEART CATH AND CORONARY ANGIOGRAPHY N/A 03/11/2022   Procedure: RIGHT HEART CATH AND CORONARY ANGIOGRAPHY;  Surgeon: McABurnell BlanksD;  Location: [facility] [place] [facility];  Service: Cardiovascular;  Laterality: N/A;    MEDICATIONS:  acetaminophen (TYLENOL) 500 MG tablet   acetaminophen-codeine (TYLENOL #3) 300-30 MG tablet   aspirin EC 81 MG tablet   atorvastatin (LIPITOR) 20 MG tablet   Cholecalciferol (VITAMIN D3) 125 MCG (5000 UT) CAPS   diclofenac Sodium (VOLTAREN) 1 % GEL   ELDERBERRY PO   enalapril (VASOTEC) 10 MG tablet   finasteride (PROSCAR) 5 MG tablet   GARCINIA CAMBOGIA-CHROMIUM PO   guaifenesin (HUMIBID E) 400 MG TABS tablet   hydrochlorothiazide (MICROZIDE) 12.5 MG capsule   ipratropium (ATROVENT) 0.06 % nasal spray   levothyroxine (SYNTHROID) 150 MCG tablet   loratadine (CLARITIN) 10 MG tablet  meloxicam (MOBIC) 15 MG tablet   Misc Natural Products (URINOZINC PROSTATE) CAPS   Multiple Vitamins-Minerals (IMMUNE SUPPORT PO)   Polyethyl Glycol-Propyl Glycol (SYSTANE OP)   Saw Palmetto 500 MG CAPS   tamsulosin (FLOMAX) 0.4 MG CAPS capsule   vitamin B-12 (CYANOCOBALAMIN) 500 MCG tablet   vitamin C (ASCORBIC ACID) 500 MG tablet   No current facility-administered medications for this encounter.    Myra Gianotti, PA-C Surgical Short Stay/Anesthesiology [facility] Phone [phone] [facility] Phone [phone] 05/25/2022 10:38 AM

## 2022-05-25 NOTE — H&P (Signed)
[address]       [place] [address]             [phone]      Cardiothoracic Surgery Admission History or Physical   PCP is Inda Coke, [place] Referring Provider is Lauree Chandler, MD Primary Cardiologist is None   Reason for admission: Severe aortic stenosis.   HPI:   The patient is a 81 year old gentleman with a history of hypertension, hyperlipidemia, hypothyroidism, and severe aortic stenosis who was referred for consideration of TAVR.  The patient reports a 6 to 30-monthhistory of progressive exertional dyspnea and fatigue.  He has had some mild left chest discomfort with pain into his left arm with ambulation.  He has had occasional episodes of dizziness but no syncope.  A 2D echocardiogram in February 2023 showed a mean gradient across the aortic valve of 26.5 mmHg with a peak gradient of 46 mmHg and a valve area of 1.21 cm.  Left ventricular ejection fraction was 60 to 65% with a stroke-volume index of 45.  The mean gradient across the aortic valve 1 year prior was 22 mmHg.  He underwent a cardiac scoring CT on 02/23/2022 which showed an aortic valve calcium score of 2428 consistent with severe aortic stenosis.  The coronary calcium score was 1599 which was 81st percentile.  The scan also showed areas of bilateral peripheral groundglass density with architectural distortion that were felt to be likely due to fibrosis from his prior COVID-19 pneumonia.  Cardiac catheterization showed mild nonobstructive coronary disease with mildly elevated LV filling pressure.   The patient lives with his wife.  He retired as a [employer] trader  He was in the hospital for about 5 days in February 2022 with COVID-pneumonia and hypoxic respiratory failure.  He was on oxygen for a while at home.       Past Medical History:  Diagnosis Date   Allergy     Arthritis     BPH (benign prostatic hyperplasia)     Cataract     Dyslipidemia     Erectile  dysfunction     GERD (gastroesophageal reflux disease)     Hx of adenomatous colonic polyps     Hyperlipidemia     Hypertension     Hypogonadism male     Hypothyroid     Obesity     Severe aortic stenosis             Past Surgical History:  Procedure Laterality Date   APPENDECTOMY       COLONOSCOPY   08/2008    Dr. MWatt Climes  HERNIA REPAIR   1970    [facility]   RIGHT HEART CATH AND CORONARY ANGIOGRAPHY N/A 03/11/2022    Procedure: RIGHT HEART CATH AND CORONARY ANGIOGRAPHY;  Surgeon: MBurnell Blanks MD;  [facility];  Service: Cardiovascular;  Laterality: N/A;           Family History  Problem Relation Age of Onset   Breast cancer Mother     Lung cancer Father     Brain cancer Father     Breast cancer Sister     Heart attack Sister        Social History         Socioeconomic History   Marital status: Married      Spouse name: Not on file   Number of children: 3   Years of education: Not on file  Highest education level: Not on file  Occupational History   Occupation: Scientist, research (medical)   Occupation: Retired-Construction  Tobacco Use   Smoking status: Never   Smokeless tobacco: Never   Tobacco comments:      He chews on a cigar  Vaping Use   Vaping Use: Never used  Substance and Sexual Activity   Alcohol use: No   Drug use: No   Sexual activity: Not Currently  Other Topics Concern   Not on file  Social History Narrative    Superintendent for construction site    Lives with wife    2 step children    2 sons    Social Determinants of Health    Financial Resource Strain: Not on file  Food Insecurity: Not on file  Transportation Needs: Not on file  Physical Activity: Not on file  Stress: Not on file  Social Connections: Not on file  Intimate Partner Violence: Not on file             Prior to Admission medications   Medication Sig Start Date End Date Taking? Authorizing Provider  acetaminophen (TYLENOL) 500 MG tablet  Take 1,000 mg by mouth every 6 (six) hours as needed for moderate pain.     Yes [provider]  acetaminophen-codeine (TYLENOL #3) 300-30 MG tablet Take 1 tablet by mouth every 6 (six) hours as needed for moderate pain. 04/03/22   Yes Inda Coke, PA  aspirin EC 81 MG tablet Take 81 mg by mouth daily. Swallow whole.     Yes [provider]  atorvastatin (LIPITOR) 20 MG tablet TAKE 1 TABLET (20 MG TOTAL) BY MOUTH DAILY. 05/06/22   Yes Inda Coke, PA  Cholecalciferol (VITAMIN D3) 125 MCG (5000 UT) CAPS Take 5,000 Units by mouth daily. Patient not taking: Reported on 05/21/2022     Yes [provider]  enalapril (VASOTEC) 10 MG tablet TAKE 1 TABLET (10 MG TOTAL) BY MOUTH DAILY. 05/06/22   Yes Worley, Aldona Bar, PA  finasteride (PROSCAR) 5 MG tablet TAKE 1 TABLET (5 MG TOTAL) BY MOUTH DAILY. 05/06/22   Yes Inda Coke, PA  GARCINIA CAMBOGIA-CHROMIUM PO Take 1,000 mg by mouth daily. Patient not taking: Reported on 05/21/2022     Yes [provider]  guaifenesin (HUMIBID E) 400 MG TABS tablet Take 400 mg by mouth daily.     Yes [provider]  hydrochlorothiazide (MICROZIDE) 12.5 MG capsule TAKE 1 CAPSULE (12.5 MG TOTAL) BY MOUTH DAILY. 05/18/22   Yes Inda Coke, PA  ipratropium (ATROVENT) 0.06 % nasal spray Place 2 sprays into both nostrils 4 (four) times daily. Patient taking differently: Place 2 sprays into both nostrils 4 (four) times daily as needed for rhinitis. 03/02/22   Yes Morene Rankins, Aldona Bar, PA  levothyroxine (SYNTHROID) 150 MCG tablet TAKE 1 TABLET EVERY MORNING PRIOR TO BREAKFAST 05/06/22   Yes Inda Coke, PA  loratadine (CLARITIN) 10 MG tablet Take 10 mg by mouth daily.     Yes [provider]  meloxicam (MOBIC) 15 MG tablet TAKE 1 TABLET (15 MG TOTAL) BY MOUTH DAILY. 05/06/22   Yes Inda Coke, PA  Multiple Vitamins-Minerals (IMMUNE SUPPORT PO) Take 1 tablet by mouth daily. NHR Immune Protect Patient not taking:  Reported on 05/21/2022     Yes [provider]  Polyethyl Glycol-Propyl Glycol (SYSTANE OP) Place 1 drop into both eyes daily as needed (irritation).     Yes [provider]  Saw Palmetto 500 MG CAPS Take 500 mg  by mouth 2 (two) times daily. Patient not taking: Reported on 05/21/2022     Yes [provider]  tamsulosin (FLOMAX) 0.4 MG CAPS capsule TAKE 1 CAPSULE (0.4 MG TOTAL) BY MOUTH DAILY. 05/06/22   Yes Inda Coke, PA  vitamin C (ASCORBIC ACID) 500 MG tablet Take 500 mg by mouth daily. Patient not taking: Reported on 05/21/2022     Yes [provider]  diclofenac Sodium (VOLTAREN) 1 % GEL Apply 2 g topically daily as needed (pain).       [provider]  ELDERBERRY PO Take 1 capsule by mouth daily. Patient not taking: Reported on 05/21/2022       [provider]  Misc Natural Products [facility] PROSTATE) CAPS Take 1 capsule by mouth daily. Patient not taking: Reported on 05/21/2022       [provider]  vitamin B-12 (CYANOCOBALAMIN) 500 MCG tablet Take 500 mcg by mouth daily. Patient not taking: Reported on 05/21/2022       [provider]            Current Outpatient Medications  Medication Sig Dispense Refill   acetaminophen (TYLENOL) 500 MG tablet Take 1,000 mg by mouth every 6 (six) hours as needed for moderate pain.       acetaminophen-codeine (TYLENOL #3) 300-30 MG tablet Take 1 tablet by mouth every 6 (six) hours as needed for moderate pain. 30 tablet 0   aspirin EC 81 MG tablet Take 81 mg by mouth daily. Swallow whole.       atorvastatin (LIPITOR) 20 MG tablet TAKE 1 TABLET (20 MG TOTAL) BY MOUTH DAILY. 90 tablet 1   Cholecalciferol (VITAMIN D3) 125 MCG (5000 UT) CAPS Take 5,000 Units by mouth daily. (Patient not taking: Reported on 05/21/2022)       enalapril (VASOTEC) 10 MG tablet TAKE 1 TABLET (10 MG TOTAL) BY MOUTH DAILY. 90 tablet 1   finasteride (PROSCAR) 5 MG tablet TAKE 1 TABLET (5 MG TOTAL) BY MOUTH DAILY.  90 tablet 1   GARCINIA CAMBOGIA-CHROMIUM PO Take 1,000 mg by mouth daily. (Patient not taking: Reported on 05/21/2022)       guaifenesin (HUMIBID E) 400 MG TABS tablet Take 400 mg by mouth daily.       hydrochlorothiazide (MICROZIDE) 12.5 MG capsule TAKE 1 CAPSULE (12.5 MG TOTAL) BY MOUTH DAILY. 90 capsule 1   ipratropium (ATROVENT) 0.06 % nasal spray Place 2 sprays into both nostrils 4 (four) times daily. (Patient taking differently: Place 2 sprays into both nostrils 4 (four) times daily as needed for rhinitis.) 15 mL 12   levothyroxine (SYNTHROID) 150 MCG tablet TAKE 1 TABLET EVERY MORNING PRIOR TO BREAKFAST 90 tablet 1   loratadine (CLARITIN) 10 MG tablet Take 10 mg by mouth daily.       meloxicam (MOBIC) 15 MG tablet TAKE 1 TABLET (15 MG TOTAL) BY MOUTH DAILY. 90 tablet 1   Multiple Vitamins-Minerals (IMMUNE SUPPORT PO) Take 1 tablet by mouth daily. NHR Immune Protect (Patient not taking: Reported on 05/21/2022)       Polyethyl Glycol-Propyl Glycol (SYSTANE OP) Place 1 drop into both eyes daily as needed (irritation).       Saw Palmetto 500 MG CAPS Take 500 mg by mouth 2 (two) times daily. (Patient not taking: Reported on 05/21/2022)       tamsulosin (FLOMAX) 0.4 MG CAPS capsule TAKE 1 CAPSULE (0.4 MG TOTAL) BY MOUTH DAILY. 90 capsule 1   vitamin C (ASCORBIC ACID) 500 MG tablet  Take 500 mg by mouth daily. (Patient not taking: Reported on 05/21/2022)       diclofenac Sodium (VOLTAREN) 1 % GEL Apply 2 g topically daily as needed (pain).       ELDERBERRY PO Take 1 capsule by mouth daily. (Patient not taking: Reported on 05/21/2022)       Misc Natural Products [facility] PROSTATE) CAPS Take 1 capsule by mouth daily. (Patient not taking: Reported on 05/21/2022)       vitamin B-12 (CYANOCOBALAMIN) 500 MCG tablet Take 500 mcg by mouth daily. (Patient not taking: Reported on 05/21/2022)        No current facility-administered medications for this visit.          Allergies  Allergen Reactions   Penicillins Rash           Review of Systems:               General:                      normal appetite, + decreased energy, + weight gain, no weight loss, no fever             Cardiac:                       + chest pain with exertion, no chest pain at rest, + SOB with mild exertion, no resting SOB, no PND, no orthopnea, no palpitations, no arrhythmia, no atrial fibrillation, no LE edema, + dizzy spells, no syncope             Respiratory:                 + exertional shortness of breath, no home oxygen, no productive cough, + dry cough, no bronchitis, no wheezing, no hemoptysis, no asthma, no pain with inspiration or cough, no sleep apnea, no CPAP at night             GI:                               no difficulty swallowing, no reflux, no frequent heartburn, no hiatal hernia, no abdominal pain, no constipation, no diarrhea, no hematochezia, no hematemesis, no melena             GU:                              no dysuria,  + frequency, no urinary tract infection, no hematuria, no enlarged prostate, no kidney stones, no kidney disease             Vascular:                     + pain suggestive of claudication, no pain in feet, no leg cramps, no varicose veins, no DVT, no non-healing foot ulcer             Neuro:                         no stroke, no TIA's, no seizures, no headaches, no temporary blindness one eye,  no slurred speech, no peripheral neuropathy, no chronic pain, no instability of gait, + memory/cognitive dysfunction             Musculoskeletal:         +  arthritis, no joint swelling, no myalgias, no difficulty walking, normal mobility              Skin:                            no rash, no itching, no skin infections, no pressure sores or ulcerations             Psych:                         no anxiety, no depression, no nervousness, no unusual recent stress             Eyes:                           no blurry vision, + floaters, + recent vision changes, + wears glasses            ENT:                             + hearing loss, no loose or painful teeth, no dentures, last saw dentist 05/05/2022             Hematologic:               + easy bruising, no abnormal bleeding, no clotting disorder, no frequent epistaxis             Endocrine:                   no diabetes, does not check CBG's at home                            Physical Exam:               BP (!) 160/74   Pulse 87   Resp 20   Ht '5\' 10"'$  (1.778 m)   Wt 252 lb (114.3 kg)   SpO2 97% Comment: RA  BMI 36.16 kg/m              General:                      Elderly,  well-appearing             HEENT:                       Unremarkable, NCAT, PERLA, EOMI             Neck:                           no JVD, no bruits, no adenopathy              Chest:                          clear to auscultation, symmetrical breath sounds, no wheezes, no rhonchi              CV:                              RRR, 3/6 systolic murmur RSB, no diastolic murmur  Abdomen:                    soft, non-tender, no masses              Extremities:                 warm, well-perfused, pulses palpable in ankles, no lower extremity edema             Rectal/GU                   Deferred             Neuro:                         Grossly non-focal and symmetrical throughout             Skin:                            Clean and dry, no rashes, no breakdown   Diagnostic Tests:     ECHOCARDIOGRAM REPORT         Patient Name:   ELWARD NOCERA  Date of Exam: 12/26/2021  Medical Rec #:  [id number]     Height:       68.5 in  Accession #:    [id number]    Weight:       248.0 lb  Date of Birth:  1940/11/30    BSA:          2.252 m  Patient Age:    6 years      BP:           142/88 mmHg  Patient Gender: M             HR:           78 bpm.  Exam Location:  [facility]   Procedure: 2D Echo, Cardiac Doppler and Color Doppler   Indications:    R07.9 Chest pain     History:        Patient has prior history of Echocardiogram examinations,  most                   recent 12/26/2020. DVT; Risk Factors:Hypertension,  Dyslipidemia                  and Former Smoker. Morbid obesity. COVID-19.     Sonographer:    Basilia Jumbo BS, RDCS  Referring Phys: [id number] [place]     1. Left ventricular ejection fraction, by estimation, is 60 to 65%. The  left ventricle has normal function. The left ventricle has no regional  wall motion abnormalities. Left ventricular diastolic parameters were  normal.   2. Right ventricular systolic function is normal. The right ventricular  size is normal. Tricuspid regurgitation signal is inadequate for assessing  PA pressure.   3. Left atrial size was moderately dilated.   4. The mitral valve is normal in structure. Mild mitral valve  regurgitation. No evidence of mitral stenosis.   5. The aortic valve was not well visualized. There is severe calcifcation  of the aortic valve. Aortic valve regurgitation is not visualized.  Moderate to severe aortic valve stenosis. Vmax 3.5 m/s, MG 63mHg, AVA 1.4  cm^2, DI 0.22   Comparison(s): 12/26/20 EF 60-65%. Moderate AS 264mg mean PG, 3246m peak  PG.   FINDINGS  Left Ventricle: Left ventricular ejection fraction, by estimation, is 60  to 65%. The left ventricle has normal function. The left ventricle has no  regional wall motion abnormalities. The left ventricular internal cavity  size was normal in size. There is   no left ventricular hypertrophy. Left ventricular diastolic parameters  were normal.   Right Ventricle: The right ventricular size is normal. No increase in  right ventricular wall thickness. Right ventricular systolic function is  normal. Tricuspid regurgitation signal is inadequate for assessing PA  pressure.   Left Atrium: Left atrial size was moderately dilated.   Right Atrium: Right atrial size was normal in size.   Pericardium: Trivial pericardial effusion is present.   Mitral Valve: The mitral valve is normal  in structure. Mild mitral valve  regurgitation. No evidence of mitral valve stenosis.   Tricuspid Valve: The tricuspid valve is normal in structure. Tricuspid  valve regurgitation is not demonstrated.   Aortic Valve: The aortic valve was not well visualized. There is severe  calcifcation of the aortic valve. Aortic valve regurgitation is not  visualized. Moderate to severe aortic stenosis is present. Aortic valve  mean gradient measures 26.5 mmHg. Aortic  valve peak gradient measures 46.0 mmHg. Aortic valve area, by VTI measures  1.21 cm.   Pulmonic Valve: The pulmonic valve was not well visualized. Pulmonic valve  regurgitation is not visualized.   Aorta: The aortic root and ascending aorta are structurally normal, with  no evidence of dilitation.   IAS/Shunts: The interatrial septum was not well visualized.      LEFT VENTRICLE  PLAX 2D  LVIDd:         4.70 cm   Diastology  LVIDs:         3.20 cm   LV e' medial:    7.04 cm/s  LV PW:         1.00 cm   LV E/e' medial:  14.8  LV IVS:        1.00 cm   LV e' lateral:   9.96 cm/s  LVOT diam:     2.80 cm   LV E/e' lateral: 10.4  LV SV:         101  LV SV Index:   45  LVOT Area:     6.16 cm      RIGHT VENTRICLE  RV Basal diam:  3.70 cm  RV S prime:     13.70 cm/s  TAPSE (M-mode): 2.5 cm   LEFT ATRIUM              Index        RIGHT ATRIUM           Index  LA diam:        4.50 cm  2.00 cm/m   RA Area:     20.30 cm  LA Vol (A2C):   115.0 ml 51.07 ml/m  RA Volume:   57.40 ml  25.49 ml/m  LA Vol (A4C):   101.0 ml 44.86 ml/m  LA Biplane Vol: 109.0 ml 48.41 ml/m   AORTIC VALVE  AV Area (Vmax):    1.24 cm  AV Area (Vmean):   1.30 cm  AV Area (VTI):     1.21 cm  AV Vmax:           339.00 cm/s  AV Vmean:          225.200 cm/s  AV VTI:  0.840 m  AV Peak Grad:      46.0 mmHg  AV Mean Grad:      26.5 mmHg  LVOT Vmax:         68.27 cm/s  LVOT Vmean:        47.467 cm/s  LVOT VTI:          0.164 m  LVOT/AV VTI  ratio: 0.20     AORTA  Ao Root diam: 3.70 cm  Ao Asc diam:  3.60 cm   MITRAL VALVE                              SHUNTS  MV Decel Time: 197 msec     Systemic VTI:  0.16 m  MV E velocity: 104.00 cm/s  Systemic Diam: 2.80 cm  MV A velocity: 80.20 cm/s  MV E/A ratio:  1.30   Oswaldo Milian MD  Electronically signed by Oswaldo Milian MD  Signature Date/Time: 12/26/2021/1:46:22 PM         Final       Physicians   Panel Physicians Referring Physician Case Authorizing Physician  Burnell Blanks, MD (Primary)        Procedures   RIGHT HEART CATH AND CORONARY ANGIOGRAPHY    Conclusion       Mid RCA lesion is 20% stenosed.   1st Mrg lesion is 20% stenosed.   Mid LAD lesion is 20% stenosed.   Mild non-obstructive CAD Mild elevation LV filling pressure and wedge pressure   Recommendations: Medical management of CAD. Will have him increase his HCTZ to 25 mg daily. Continue workup for TAVR   Indications   Severe aortic stenosis [I35.0 (ICD-10-CM)]    Procedural Details   Technical Details Indication: Severe AS, TAVR workup  Procedure: The risks, benefits, complications, treatment options, and expected outcomes were discussed with the patient. The patient and/or family concurred with the proposed plan, giving informed consent. The patient was brought to the cath lab after IV hydration was given. The patient was sedated with Versed and Fentanyl. The IV catheter in the right antecubital vein was changed for a 5 [place] sheath. Right heart catheterization performed with a balloon tipped catheter. The right wrist was prepped and draped in a sterile fashion. 1% lidocaine was used for local anesthesia. Using the modified Seldinger access technique, a 5 French sheath was placed in the right radial artery. 3 mg Verapamil was given through the sheath. Weight based IV heparin was given. Standard diagnostic catheters were used to perform selective coronary angiography. I did  not cross the aortic valve. All catheter exchanges were performed over an exchange length guidewire.   The sheath was removed from the right radial artery and a Terumo hemostasis band was applied at the arteriotomy site on the right wrist.      Estimated blood loss <50 mL.   During this procedure medications were administered to achieve and maintain moderate conscious sedation while the patient's heart rate, blood pressure, and oxygen saturation were continuously monitored and I was present face-to-face 100% of this time.      Medications (Filter: Administrations occurring from 1114 to 1217 on 03/11/22)  important  Continuous medications are totaled by the amount administered until 03/11/22 1217.    Heparin (Porcine) in NaCl 1000-0.9 UT/500ML-% SOLN (mL) Total volume:  1,000 mL  Date/Time Rate/Dose/Volume Action    03/11/22 1127 500 mL Given    1127 500 mL Given  fentaNYL (SUBLIMAZE) injection (mcg) Total dose:  25 mcg  Date/Time Rate/Dose/Volume Action    03/11/22 1134 25 mcg Given      midazolam (VERSED) injection (mg) Total dose:  1 mg  Date/Time Rate/Dose/Volume Action    03/11/22 1134 1 mg Given      lidocaine (PF) (XYLOCAINE) 1 % injection (mL) Total volume:  17 mL  Date/Time Rate/Dose/Volume Action    03/11/22 1142 2 mL Given    1144 15 mL Given      Radial Cocktail/Verapamil only (mL) Total volume:  10 mL  Date/Time Rate/Dose/Volume Action    03/11/22 1143 10 mL Given      heparin sodium (porcine) injection (Units) Total dose:  5,000 Units  Date/Time Rate/Dose/Volume Action    03/11/22 1154 5,000 Units Given      iohexol (OMNIPAQUE) 350 MG/ML injection (mL) Total volume:  60 mL  Date/Time Rate/Dose/Volume Action    03/11/22 1204 60 mL Given      Sedation Time   Sedation Time Physician-1: 29 minutes 56 seconds Contrast   Medication Name Total Dose  iohexol (OMNIPAQUE) 350 MG/ML injection 60 mL    Radiation/Fluoro   Fluoro time: 8.6  (min) DAP: 34423 (mGycm2) Cumulative Air Kerma: 742 (mGy) Complications      Complications documented before study signed (03/11/2022 59:56 PM)     No complications were associated with this study.  Documented by Leone Payor D - 03/11/2022 12:10 PM      Coronary Findings   Diagnostic Dominance: Right Left Anterior Descending  Vessel is large.  Mid LAD lesion is 20% stenosed.    Left Circumflex  Vessel is moderate in size.    First Obtuse Marginal Branch  1st Mrg lesion is 20% stenosed.    Right Coronary Artery  Vessel is large.  Mid RCA lesion is 20% stenosed.    Intervention    No interventions have been documented.    Coronary Diagrams   Diagnostic Dominance: Right  Intervention   Implants      No implant documentation for this case.    [employer] Images    Show images for CARDIAC CATHETERIZATION Images on Long Term Storage    Show images for Claudio, Mondry to Procedure Log   Procedure Log    Hemo Data   Flowsheet Row Most Recent Value  Fick Cardiac Output 6.76 L/min  Fick Cardiac Output Index 2.97 (L/min)/BSA  RA A Wave 10 mmHg  RA V Wave 8 mmHg  RA Mean 7 mmHg  RV Systolic Pressure 43 mmHg  RV Diastolic Pressure 3 mmHg  RV EDP 12 mmHg  PA Systolic Pressure 43 mmHg  PA Diastolic Pressure 18 mmHg  PA Mean 27 mmHg  PW A Wave 30 mmHg  PW V Wave 30 mmHg  PW Mean 22 mmHg  AO Systolic Pressure 387 mmHg  AO Diastolic Pressure 72 mmHg  AO Mean 110 mmHg  QP/QS 1  TPVR Index 9.08 HRUI  TSVR Index 37 HRUI  PVR SVR Ratio 0.05  TPVR/TSVR Ratio 0.25      ADDENDUM REPORT: 03/28/2022 16:20   CLINICAL DATA:  Severe Aortic Stenosis.   EXAM: Cardiac TAVR CT   TECHNIQUE: A non-contrast, gated CT scan was obtained with axial slices of 3 mm through the heart for aortic valve calcium scoring. A 120 kV retrospective, gated, contrast cardiac scan was obtained. Gantry rotation speed was 250 msecs and collimation was 0.6 mm. Nitroglycerin was not  given. The 3D data set was  reconstructed in 5% intervals of the 0-95% of the R-R cycle. Systolic and diastolic phases were analyzed on a dedicated workstation using MPR, MIP, and VRT modes. The patient received 100 cc of contrast.   FINDINGS: Image quality: Excellent.   Noise artifact is: Limited.   Valve Morphology: Tricuspid aortic valve with diffuse severe calcifications. Severely restricted leaflet motion in systole.   Aortic Valve Calcium score: 1862   Aortic annular dimension:   Phase assessed: 30%   Annular area: 601 mm2   Annular perimeter: 89.0 mm   Max diameter: 30.2 mm   Min diameter: 26.3 mm   Annular and subannular calcification: None.   Membranous septum length: 11.5 mm   Optimal coplanar projection: RAO 4 CAU 7   Coronary Artery Height above Annulus:   Left Main: 13.0 mm   Right Coronary: 19.5 mm   Sinus of Valsalva Measurements:   Non-coronary: 32 mm   Right-coronary: 35 mm   Left-coronary: 34 mm   Sinus of Valsalva Height:   Non-coronary: 22.8 mm   Right-coronary: 23.8 mm   Left-coronary: 22.0 mm   Sinotubular Junction: 31 mm   Ascending Thoracic Aorta: 35 mm   Coronary Arteries: Normal coronary origin. Right dominance. The study was performed without use of NTG and is insufficient for plaque evaluation. Please refer to recent cardiac catheterization for coronary assessment.   Cardiac Morphology:   Right Atrium: Right atrial size is within normal limits.   Right Ventricle: The right ventricular cavity is within normal limits.   Left Atrium: Left atrial size is normal in size with no left atrial appendage filling defect.   Left Ventricle: The ventricular cavity size is within normal limits. There are no stigmata of prior infarction.   Pulmonary arteries: Mild dilation of the main pulmonary artery suggestive of pulmonary hypertension.   Pulmonary veins: Normal pulmonary venous drainage.   Pericardium: Normal thickness  with no significant effusion or calcium present.   Mitral Valve: The mitral valve is normal structure without significant calcification.   Extra-cardiac findings: See attached radiology report for non-cardiac structures.   IMPRESSION: 1. Annular measurements support a 29 mm S3 (601 mm2).   2. No significant annular or subannular calcifications.   3. Sufficient coronary to annulus distance.   4. Optimal Fluoroscopic Angle for Delivery: RAO 4 CAU 7   5. Mild dilation of the main pulmonary artery suggestive of pulmonary hypertension.   [place]ells T. Audie Box, MD     Electronically Signed   By: Eleonore Chiquito M.D.   On: 03/28/2022 16:20    Addended by Geralynn Rile, MD on 03/28/2022  4:22 PM    Study Result   Narrative & Impression  EXAM: OVER-READ INTERPRETATION  CT CHEST   The following report is a limited chest CT over-read performed by radiologist Dr. Yetta Glassman of [facility], [place] on 03/27/2022. This over-read does not include interpretation of cardiac or coronary anatomy or pathology This over-read does not include interpretation of cardiac or coronary anatomy or pathology. The cardiac CTA interpretation by the cardiologist is attached.   COMPARISON:  None Available.   FINDINGS: Extracardiac findings will be described separately under dictation for contemporaneously obtained CTA chest, abdomen and pelvis.   IMPRESSION: Please see separate dictation for contemporaneously obtained CTA chest, abdomen and pelvis dated 03/27/2022 for full description of relevant extracardiac findings.   Electronically Signed: By: Yetta Glassman M.D. On: 03/27/2022 11:26        Narrative & Impression  CLINICAL DATA:  Preop evaluation for aortic valve replacement   EXAM: CT ANGIOGRAPHY CHEST, ABDOMEN AND PELVIS   TECHNIQUE: Multidetector CT imaging through the chest, abdomen and pelvis was performed using the standard protocol during bolus administration  of intravenous contrast. Multiplanar reconstructed images and MIPs were obtained and reviewed to evaluate the vascular anatomy.   RADIATION DOSE REDUCTION: This exam was performed according to the departmental dose-optimization program which includes automated exposure control, adjustment of the mA and/or kV according to patient size and/or use of iterative reconstruction technique.   CONTRAST:  191m OMNIPAQUE IOHEXOL 350 MG/ML SOLN   COMPARISON:  None Available.   FINDINGS: CTA CHEST FINDINGS   Cardiovascular: Normal heart size. No pericardial effusion. Left main and three-vessel coronary artery calcifications. Aortic valve thickening and calcifications. Normal caliber thoracic aorta with moderate calcified and noncalcified plaque. No suspicious filling defects of the central pulmonary arteries. Standard three-vessel aortic arch with no significant stenosis.   Mediastinum/Nodes: Thyroid is unremarkable. Small hiatal hernia. No pathologically enlarged lymph nodes seen in the chest.   Lungs/Pleura: Central airways are patent. Mild bilateral ground-glass opacities and peribronchovascular reticular opacities. No consolidation, pleural effusion or pneumothorax.   Musculoskeletal: No chest wall abnormality. No acute or significant osseous findings.   CTA ABDOMEN AND PELVIS FINDINGS   Hepatobiliary: No focal liver abnormality is seen. No gallstones, gallbladder wall thickening, or biliary dilatation.   Pancreas: Unremarkable. No pancreatic ductal dilatation or surrounding inflammatory changes.   Spleen: Normal in size without focal abnormality.   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 Left adrenal gland is unremarkable. No hydronephrosis or nephrolithiasis. Bladder is unremarkable.   Stomach/Bowel: Stomach is within normal limits. Appendix is not visualized.  Diverticulosis. No evidence of bowel wall thickening, distention, or inflammatory changes.   Vascular/lymphatic: No pathologically enlarged lymph nodes seen in the chest. Normal caliber abdominal aorta with moderate calcified and noncalcified plaque. Major branch vessels are patent. Mild narrowing at the origin of the IMA due to calcified and noncalcified plaque, otherwise no evidence of significant stenosis.   Reproductive: Prostatomegaly.   Other: Small bilateral fat containing inguinal hernias.   Musculoskeletal: No acute or significant osseous findings.   VASCULAR MEASUREMENTS PERTINENT TO TAVR:   AORTA:   Minimal Aortic Diameter-15.5 mm   Severity of Aortic Calcification-moderate   RIGHT PELVIS:   Right Common Iliac Artery -   Minimal Diameter-7.7 mm   Tortuosity-mild   Calcification-moderate   Right External Iliac Artery -   Minimal Diameter-8.6 mm   Tortuosity-moderate   Calcification-none   Right Common Femoral Artery -   Minimal Diameter-3.3 mm   Tortuosity-none   Calcification-severe   LEFT PELVIS:   Left Common Iliac Artery -   Minimal Diameter-5.0 mm   Tortuosity-mild   Calcification-severe   Left External Iliac Artery -   Minimal Diameter-8.2 mm   Tortuosity-none   Calcification-none   Left Common Femoral Artery -   Minimal Diameter-6.4 mm   Tortuosity-none   Calcification-mild   Review of the MIP images confirms the above findings.   IMPRESSION: 1. Vascular findings and measurements pertinent to potential TAVR procedure, as detailed above. 2. Severe thickening calcification of the aortic valve, compatible with reported clinical history of severe aortic stenosis. 3. 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 Intraluminal calcification of the right common femoral artery causing severe narrowing. 4. Left main and 3 vessel coronary artery disease. 5. Mild bilateral ground-glass opacities and  peribronchovascular reticular opacities, likely sequela of prior COVID-19 infection.     Electronically Signed   By: Yetta Glassman M.D.   On: 03/27/2022 12:27        Impression:   This 81 year old gentleman has stage D3, severe, symptomatic aortic stenosis with [place] Heart Association class III symptoms of exertional fatigue and shortness of breath consistent with chronic diastolic congestive heart failure.  I have personally reviewed his 2D echocardiogram, cardiac catheterization, and CTA studies.  Echocardiogram shows a severely thickened and calcified aortic valve with restricted leaflet mobility.  The mean gradient was 27 mmHg with a dimensionless index of 0.22 and a valve area measured at 1.4 cm.  His gated cardiac CTA shows a trileaflet aortic valve with severe thickening and calcification with restricted leaflet mobility.  His aortic valve calcium score was consistent with severe aortic stenosis.  Cardiac catheterization shows mild nonobstructive coronary disease.  I agree that aortic valve replacement is indicated in this patient for relief of his symptoms and to prevent left ventricular dysfunction.  Given his advanced age I think that transcatheter aortic valve replacement would be the best option for him.  His gated cardiac CTA shows anatomy suitable for TAVR using a SAPIEN 3 valve.  His abdominal and pelvic CTA shows adequate pelvic vascular anatomy to allow transfemoral insertion.  There is some intraluminal calcification in the right common femoral artery but we should be able to get in above that.   The patient and his wife were counseled at length regarding treatment alternatives for management of severe symptomatic aortic stenosis. The risks and benefits of surgical intervention has been discussed in detail. Long-term prognosis with medical therapy was discussed. Alternative approaches such as conventional surgical aortic valve replacement, transcatheter aortic valve  replacement, and palliative medical therapy were compared and contrasted at length. This discussion was placed in the context of the patient's own specific clinical presentation and past medical history. All of their questions have been addressed.    Following the decision to proceed with transcatheter aortic valve replacement, a discussion was held regarding what types of management strategies would be attempted intraoperatively in the event of life-threatening complications, including whether or not the patient would be considered a candidate for the use of cardiopulmonary bypass and/or conversion to open sternotomy for attempted surgical intervention.  I think he would be a candidate for emergent sternotomy to manage any intraoperative complications.  Although he had fairly severe COVID-pneumonia his recent pulmonary function testing is normal.  The patient is aware of the fact that transient use of cardiopulmonary bypass may be necessary. The patient has been advised of a variety of complications that might develop including but not limited to risks of death, stroke, paravalvular leak, aortic dissection or other major vascular complications, aortic annulus rupture, device embolization, cardiac rupture or perforation, mitral regurgitation, acute myocardial infarction, arrhythmia, heart block or bradycardia requiring permanent pacemaker placement, congestive heart failure, respiratory failure, renal failure, pneumonia, infection, other late complications related to structural valve deterioration or migration, or other complications that might ultimately cause a temporary or permanent loss of functional independence or other long term morbidity. The patient provides full informed consent for the procedure as described and all questions were answered.       Plan:   Transfemoral TAVR using a SAPIEN 3 valve.       Gaye Pollack, MD

## 2022-05-26 ENCOUNTER — Encounter (HOSPITAL_COMMUNITY)
Admission: RE | Disposition: A | Discharge status: Home / Self Care | Payer: Self-pay | Source: Home / Self Care | Attending: Cardiovascular Disease

## 2022-05-26 ENCOUNTER — Encounter (HOSPITAL_COMMUNITY): Payer: Self-pay | Admitting: Cardiovascular Disease

## 2022-05-26 ENCOUNTER — Inpatient Hospital Stay (HOSPITAL_COMMUNITY)
Admission: RE | Admit: 2022-05-26 | Discharge: 2022-05-26 | Disposition: A | Discharge status: Home / Self Care | Payer: Medicare PPO | Source: Ambulatory Visit | Attending: Cardiovascular Disease | Admitting: Cardiovascular Disease

## 2022-05-26 ENCOUNTER — Other Ambulatory Visit: Payer: Self-pay

## 2022-05-26 ENCOUNTER — Inpatient Hospital Stay (HOSPITAL_COMMUNITY): Payer: Medicare PPO | Admitting: Vascular Surgery

## 2022-05-26 ENCOUNTER — Other Ambulatory Visit: Payer: Self-pay | Admitting: Cardiology

## 2022-05-26 ENCOUNTER — Inpatient Hospital Stay (HOSPITAL_COMMUNITY)
Admission: RE | Admit: 2022-05-26 | Discharge: 2022-05-27 | DRG: 267 | Disposition: A | Discharge status: Home / Self Care | Payer: Medicare PPO | Attending: Cardiovascular Disease | Admitting: Cardiovascular Disease

## 2022-05-26 DIAGNOSIS — I130 Hypertensive heart and chronic kidney disease with heart failure and stage 1 through stage 4 chronic kidney disease, or unspecified chronic kidney disease: Secondary | ICD-10-CM | POA: Diagnosis present

## 2022-05-26 DIAGNOSIS — Z79899 Other long term (current) drug therapy: Secondary | ICD-10-CM | POA: Diagnosis not present

## 2022-05-26 DIAGNOSIS — Z791 Long term (current) use of non-steroidal anti-inflammatories (NSAID): Secondary | ICD-10-CM

## 2022-05-26 DIAGNOSIS — Z87891 Personal history of nicotine dependence: Secondary | ICD-10-CM | POA: Diagnosis not present

## 2022-05-26 DIAGNOSIS — E039 Hypothyroidism, unspecified: Secondary | ICD-10-CM

## 2022-05-26 DIAGNOSIS — Z952 Presence of prosthetic heart valve: Secondary | ICD-10-CM

## 2022-05-26 DIAGNOSIS — Z6835 Body mass index (BMI) 35.0-35.9, adult: Secondary | ICD-10-CM

## 2022-05-26 DIAGNOSIS — I350 Nonrheumatic aortic (valve) stenosis: Secondary | ICD-10-CM

## 2022-05-26 DIAGNOSIS — I82409 Acute embolism and thrombosis of unspecified deep veins of unspecified lower extremity: Secondary | ICD-10-CM | POA: Diagnosis present

## 2022-05-26 DIAGNOSIS — Z006 Encounter for examination for normal comparison and control in clinical research program: Secondary | ICD-10-CM

## 2022-05-26 DIAGNOSIS — Z7982 Long term (current) use of aspirin: Secondary | ICD-10-CM

## 2022-05-26 DIAGNOSIS — J849 Interstitial pulmonary disease, unspecified: Secondary | ICD-10-CM | POA: Diagnosis not present

## 2022-05-26 DIAGNOSIS — Z8249 Family history of ischemic heart disease and other diseases of the circulatory system: Secondary | ICD-10-CM | POA: Diagnosis not present

## 2022-05-26 DIAGNOSIS — Z8701 Personal history of pneumonia (recurrent): Secondary | ICD-10-CM

## 2022-05-26 DIAGNOSIS — Z880 Allergy status to penicillin: Secondary | ICD-10-CM | POA: Diagnosis not present

## 2022-05-26 DIAGNOSIS — Z8616 Personal history of COVID-19: Secondary | ICD-10-CM

## 2022-05-26 DIAGNOSIS — M1990 Unspecified osteoarthritis, unspecified site: Secondary | ICD-10-CM

## 2022-05-26 DIAGNOSIS — I493 Ventricular premature depolarization: Secondary | ICD-10-CM

## 2022-05-26 DIAGNOSIS — Z79890 Hormone replacement therapy (postmenopausal): Secondary | ICD-10-CM | POA: Diagnosis not present

## 2022-05-26 DIAGNOSIS — I10 Essential (primary) hypertension: Secondary | ICD-10-CM | POA: Diagnosis not present

## 2022-05-26 DIAGNOSIS — N400 Enlarged prostate without lower urinary tract symptoms: Secondary | ICD-10-CM | POA: Diagnosis present

## 2022-05-26 DIAGNOSIS — I5032 Chronic diastolic (congestive) heart failure: Secondary | ICD-10-CM | POA: Diagnosis not present

## 2022-05-26 DIAGNOSIS — E785 Hyperlipidemia, unspecified: Secondary | ICD-10-CM | POA: Diagnosis present

## 2022-05-26 DIAGNOSIS — R739 Hyperglycemia, unspecified: Secondary | ICD-10-CM

## 2022-05-26 DIAGNOSIS — K219 Gastro-esophageal reflux disease without esophagitis: Secondary | ICD-10-CM | POA: Diagnosis present

## 2022-05-26 DIAGNOSIS — I440 Atrioventricular block, first degree: Secondary | ICD-10-CM | POA: Diagnosis not present

## 2022-05-26 HISTORY — PX: INTRAOPERATIVE TRANSTHORACIC ECHOCARDIOGRAM: SHX6523

## 2022-05-26 HISTORY — PX: TRANSCATHETER AORTIC VALVE REPLACEMENT, TRANSFEMORAL: SHX6400

## 2022-05-26 HISTORY — DX: Presence of prosthetic heart valve: Z95.2

## 2022-05-26 LAB — POCT I-STAT 7, (LYTES, BLD GAS, ICA,H+H)
Acid-Base Excess: 0 mmol/L (ref 0.0–2.0)
Bicarbonate: 27.7 mmol/L (ref 20.0–28.0)
Calcium, Ion: 1.29 mmol/L (ref 1.15–1.40)
HCT: 34 % — ABNORMAL LOW (ref 39.0–52.0)
Hemoglobin: 11.6 g/dL — ABNORMAL LOW (ref 13.0–17.0)
O2 Saturation: 98 %
Potassium: 4.1 mmol/L (ref 3.5–5.1)
Sodium: 140 mmol/L (ref 135–145)
TCO2: 30 mmol/L (ref 22–32)
pCO2 arterial: 61.5 mmHg — ABNORMAL HIGH (ref 32–48)
pH, Arterial: 7.262 — ABNORMAL LOW (ref 7.35–7.45)
pO2, Arterial: 130 mmHg — ABNORMAL HIGH (ref 83–108)

## 2022-05-26 LAB — POCT I-STAT, CHEM 8
BUN: 13 mg/dL (ref 8–23)
BUN: 13 mg/dL (ref 8–23)
Calcium, Ion: 1.29 mmol/L (ref 1.15–1.40)
Calcium, Ion: 1.27 mmol/L (ref 1.15–1.40)
Chloride: 102 mmol/L (ref 98–111)
Chloride: 103 mmol/L (ref 98–111)
Creatinine, Ser: 0.7 mg/dL (ref 0.61–1.24)
Creatinine, Ser: 0.8 mg/dL (ref 0.61–1.24)
Glucose, Bld: 146 mg/dL — ABNORMAL HIGH (ref 70–99)
Glucose, Bld: 133 mg/dL — ABNORMAL HIGH (ref 70–99)
HCT: 35 % — ABNORMAL LOW (ref 39.0–52.0)
HCT: 35 % — ABNORMAL LOW (ref 39.0–52.0)
Hemoglobin: 11.9 g/dL — ABNORMAL LOW (ref 13.0–17.0)
Hemoglobin: 11.9 g/dL — ABNORMAL LOW (ref 13.0–17.0)
Potassium: 4.4 mmol/L (ref 3.5–5.1)
Potassium: 4.2 mmol/L (ref 3.5–5.1)
Sodium: 140 mmol/L (ref 135–145)
Sodium: 141 mmol/L (ref 135–145)
TCO2: 27 mmol/L (ref 22–32)
TCO2: 27 mmol/L (ref 22–32)

## 2022-05-26 LAB — ECHOCARDIOGRAM LIMITED
AR max vel: 3.89 cm2
AV Area VTI: 1.95 cm2
AV Area mean vel: 1.88 cm2
AV Mean grad: 4 mmHg
AV Peak grad: 7.4 mmHg
Ao pk vel: 1.36 m/s
Calc EF: 54.7 %
Single Plane A2C EF: 49.3 %
Single Plane A4C EF: 61.7 %

## 2022-05-26 LAB — POCT ACTIVATED CLOTTING TIME
Activated Clotting Time: 150 seconds
Activated Clotting Time: 320 seconds
Activated Clotting Time: 126 seconds

## 2022-05-26 LAB — ABO/RH: ABO/RH(D): A POS

## 2022-05-26 LAB — GLUCOSE, CAPILLARY: Glucose-Capillary: 106 mg/dL — ABNORMAL HIGH (ref 70–99)

## 2022-05-26 SURGERY — IMPLANTATION, AORTIC VALVE, TRANSCATHETER, FEMORAL APPROACH
Anesthesia: Monitor Anesthesia Care

## 2022-05-26 MED ORDER — NITROGLYCERIN IN D5W 200-5 MCG/ML-% IV SOLN
INTRAVENOUS | Status: AC
  Filled 2022-05-26: qty 250

## 2022-05-26 MED ORDER — SODIUM CHLORIDE 0.9 % IV SOLN: 250.0000 mL | INTRAVENOUS | Status: DC | PRN

## 2022-05-26 MED ORDER — DEXMEDETOMIDINE (PRECEDEX) IN NS 20 MCG/5ML (4 MCG/ML) IV SYRINGE
PREFILLED_SYRINGE | INTRAVENOUS | Status: DC | PRN
  Administered 2022-05-26: 54.92 ug via INTRAVENOUS

## 2022-05-26 MED ORDER — FINASTERIDE 5 MG PO TABS
5.0000 mg | ORAL_TABLET | Freq: Every day | ORAL | Status: DC
  Administered 2022-05-26 – 2022-05-27 (×2): 5 mg via ORAL
  Filled 2022-05-26 (×2): qty 1

## 2022-05-26 MED ORDER — ASPIRIN 81 MG PO TBEC
81.0000 mg | DELAYED_RELEASE_TABLET | Freq: Every day | ORAL | Status: DC
  Administered 2022-05-26 – 2022-05-27 (×2): 81 mg via ORAL
  Filled 2022-05-26 (×2): qty 1

## 2022-05-26 MED ORDER — CEFAZOLIN SODIUM-DEXTROSE 2-4 GM/100ML-% IV SOLN
2.0000 g | Freq: Three times a day (TID) | INTRAVENOUS | Status: AC
  Administered 2022-05-26 – 2022-05-27 (×2): 2 g via INTRAVENOUS
  Filled 2022-05-26 (×2): qty 100

## 2022-05-26 MED ORDER — ACETAMINOPHEN 325 MG PO TABS
650.0000 mg | ORAL_TABLET | Freq: Four times a day (QID) | ORAL | Status: DC | PRN
  Administered 2022-05-26: 650 mg via ORAL
  Filled 2022-05-26: qty 2

## 2022-05-26 MED ORDER — SODIUM CHLORIDE 0.9% FLUSH
3.0000 mL | Freq: Two times a day (BID) | INTRAVENOUS | Status: DC
  Administered 2022-05-26: 3 mL via INTRAVENOUS

## 2022-05-26 MED ORDER — EPHEDRINE SULFATE-NACL 50-0.9 MG/10ML-% IV SOSY
PREFILLED_SYRINGE | INTRAVENOUS | Status: DC | PRN
  Administered 2022-05-26: 5 mg via INTRAVENOUS

## 2022-05-26 MED ORDER — NITROGLYCERIN 0.2 MG/ML ON CALL CATH LAB
INTRAVENOUS | Status: DC | PRN
  Administered 2022-05-26 (×3): 20 ug via INTRAVENOUS
  Administered 2022-05-26: 40 ug via INTRAVENOUS

## 2022-05-26 MED ORDER — CHLORHEXIDINE GLUCONATE 4 % EX LIQD
30.0000 mL | CUTANEOUS | Status: DC
  Filled 2022-05-26: qty 30

## 2022-05-26 MED ORDER — GUAIFENESIN 200 MG PO TABS
400.0000 mg | ORAL_TABLET | Freq: Every day | ORAL | Status: DC
  Administered 2022-05-26 – 2022-05-27 (×2): 400 mg via ORAL
  Filled 2022-05-26 (×2): qty 2

## 2022-05-26 MED ORDER — CHLORHEXIDINE GLUCONATE 0.12 % MT SOLN
15.0000 mL | Freq: Once | OROMUCOSAL | Status: AC
  Administered 2022-05-26: 15 mL via OROMUCOSAL
  Filled 2022-05-26 (×2): qty 15

## 2022-05-26 MED ORDER — SODIUM CHLORIDE 0.9 % IV SOLN: 250.0000 mL | INTRAVENOUS | Status: DC

## 2022-05-26 MED ORDER — LIDOCAINE HCL (PF) 1 % IJ SOLN
INTRAMUSCULAR | Status: DC | PRN
  Administered 2022-05-26 (×2): 15 mL

## 2022-05-26 MED ORDER — HEPARIN (PORCINE) IN NACL 1000-0.9 UT/500ML-% IV SOLN
INTRAVENOUS | Status: AC
  Filled 2022-05-26: qty 500

## 2022-05-26 MED ORDER — SODIUM CHLORIDE 0.9% FLUSH: 3.0000 mL | INTRAVENOUS | Status: DC | PRN

## 2022-05-26 MED ORDER — ATORVASTATIN CALCIUM 10 MG PO TABS
20.0000 mg | ORAL_TABLET | Freq: Every day | ORAL | Status: DC
  Administered 2022-05-26 – 2022-05-27 (×2): 20 mg via ORAL
  Filled 2022-05-26 (×2): qty 2

## 2022-05-26 MED ORDER — FENTANYL CITRATE (PF) 100 MCG/2ML IJ SOLN
INTRAMUSCULAR | Status: AC
  Filled 2022-05-26: qty 2

## 2022-05-26 MED ORDER — PROPOFOL 10 MG/ML IV BOLUS
INTRAVENOUS | Status: DC | PRN
  Administered 2022-05-26: 20 mg via INTRAVENOUS

## 2022-05-26 MED ORDER — HEPARIN (PORCINE) IN NACL 1000-0.9 UT/500ML-% IV SOLN
INTRAVENOUS | Status: AC
  Filled 2022-05-26: qty 1000

## 2022-05-26 MED ORDER — FENTANYL CITRATE (PF) 100 MCG/2ML IJ SOLN
INTRAMUSCULAR | Status: DC | PRN
  Administered 2022-05-26: 50 ug via INTRAVENOUS

## 2022-05-26 MED ORDER — NITROGLYCERIN IN D5W 200-5 MCG/ML-% IV SOLN
0.0000 ug/min | INTRAVENOUS | Status: DC
  Administered 2022-05-26: 20 ug/min via INTRAVENOUS

## 2022-05-26 MED ORDER — ACETAMINOPHEN 650 MG RE SUPP: 650.0000 mg | Freq: Four times a day (QID) | RECTAL | Status: DC | PRN

## 2022-05-26 MED ORDER — LIDOCAINE HCL (PF) 1 % IJ SOLN
INTRAMUSCULAR | Status: AC
  Filled 2022-05-26: qty 30

## 2022-05-26 MED ORDER — ONDANSETRON HCL 4 MG/2ML IJ SOLN
INTRAMUSCULAR | Status: DC | PRN
  Administered 2022-05-26: 4 mg via INTRAVENOUS

## 2022-05-26 MED ORDER — HEPARIN SODIUM (PORCINE) 1000 UNIT/ML IJ SOLN
INTRAMUSCULAR | Status: DC | PRN
  Administered 2022-05-26: 17000 [IU] via INTRAVENOUS

## 2022-05-26 MED ORDER — CHLORHEXIDINE GLUCONATE 4 % EX LIQD
60.0000 mL | Freq: Once | CUTANEOUS | Status: DC
  Filled 2022-05-26: qty 60

## 2022-05-26 MED ORDER — LACTATED RINGERS IV SOLN: INTRAVENOUS | Status: DC

## 2022-05-26 MED ORDER — HEPARIN (PORCINE) IN NACL 1000-0.9 UT/500ML-% IV SOLN
INTRAVENOUS | Status: DC | PRN
  Administered 2022-05-26 (×3): 500 mL

## 2022-05-26 MED ORDER — TAMSULOSIN HCL 0.4 MG PO CAPS
0.4000 mg | ORAL_CAPSULE | Freq: Every day | ORAL | Status: DC
  Administered 2022-05-26 – 2022-05-27 (×2): 0.4 mg via ORAL
  Filled 2022-05-26 (×2): qty 1

## 2022-05-26 MED ORDER — SODIUM CHLORIDE 0.9 % IV SOLN: INTRAVENOUS | Status: DC

## 2022-05-26 MED ORDER — OXYCODONE HCL 5 MG PO TABS: 5.0000 mg | ORAL_TABLET | ORAL | Status: DC | PRN

## 2022-05-26 MED ORDER — LEVOTHYROXINE SODIUM 75 MCG PO TABS
150.0000 ug | ORAL_TABLET | Freq: Every day | ORAL | Status: DC
  Administered 2022-05-27: 150 ug via ORAL
  Filled 2022-05-26: qty 2

## 2022-05-26 MED ORDER — SODIUM CHLORIDE 0.9 % IV SOLN: INTRAVENOUS | Status: AC

## 2022-05-26 MED ORDER — CLEVIDIPINE BUTYRATE 0.5 MG/ML IV EMUL
INTRAVENOUS | Status: DC | PRN
  Administered 2022-05-26: 2 mg/h via INTRAVENOUS

## 2022-05-26 MED ORDER — ONDANSETRON HCL 4 MG/2ML IJ SOLN: 4.0000 mg | Freq: Four times a day (QID) | INTRAMUSCULAR | Status: DC | PRN

## 2022-05-26 MED ORDER — IOHEXOL 350 MG/ML SOLN
INTRAVENOUS | Status: DC | PRN
  Administered 2022-05-26: 60 mL

## 2022-05-26 MED ORDER — PROPOFOL 500 MG/50ML IV EMUL
INTRAVENOUS | Status: DC | PRN
  Administered 2022-05-26: 20 ug/kg/min via INTRAVENOUS

## 2022-05-26 MED ORDER — TRAMADOL HCL 50 MG PO TABS: 50.0000 mg | ORAL_TABLET | ORAL | Status: DC | PRN

## 2022-05-26 MED ORDER — MORPHINE SULFATE (PF) 2 MG/ML IV SOLN: 1.0000 mg | INTRAVENOUS | Status: DC | PRN

## 2022-05-26 MED ORDER — PROTAMINE SULFATE 10 MG/ML IV SOLN
INTRAVENOUS | Status: DC | PRN
  Administered 2022-05-26: 170 mg via INTRAVENOUS

## 2022-05-26 SURGICAL SUPPLY — 32 items
BAG SNAP BAND KOVER 36X36 (MISCELLANEOUS) ×6 IMPLANT
BLANKET WARM UNDERBOD FULL ACC (MISCELLANEOUS) ×3 IMPLANT
CATH 23 ULTRA DELIVERY (CATHETERS) IMPLANT
CATH COMMANDER DELIVERY SYS 29 (CATHETERS) ×1 IMPLANT
CATH DIAG 6FR JR4 (CATHETERS) ×1 IMPLANT
CATH DIAG 6FR PIGTAIL ANGLED (CATHETERS) ×2 IMPLANT
CATH INFINITI 6F AL2 (CATHETERS) ×1 IMPLANT
CATH S G BIP PACING (CATHETERS) ×1 IMPLANT
CLOSURE MYNX CONTROL 6F/7F (Vascular Products) ×1 IMPLANT
CLOSURE PERCLOSE PROSTYLE (VASCULAR PRODUCTS) ×2 IMPLANT
CRIMPER (MISCELLANEOUS) ×1 IMPLANT
DEVICE INFLATION ATRION QL38 (MISCELLANEOUS) ×1 IMPLANT
ELECT DEFIB PAD ADLT CADENCE (PAD) ×1 IMPLANT
GLOVE ECLIPSE 7.0 STRL STRAW (GLOVE) ×3 IMPLANT
KIT HEART LEFT (KITS) ×3 IMPLANT
KIT MICROPUNCTURE NIT STIFF (SHEATH) ×1 IMPLANT
KIT SAPIAN 3 ULTRA RESILIA 29 (Valve) ×1 IMPLANT
PACK CARDIAC CATHETERIZATION (CUSTOM PROCEDURE TRAY) ×3 IMPLANT
SHEATH BRITE TIP 7FR 35CM (SHEATH) ×1 IMPLANT
SHEATH INTRODUCER SET 29 (SHEATH) ×1 IMPLANT
SHEATH PINNACLE 6F 10CM (SHEATH) ×1 IMPLANT
SHEATH PINNACLE 8F 10CM (SHEATH) ×1 IMPLANT
SHEATH PROBE COVER 6X72 (BAG) ×1 IMPLANT
SLEEVE REPOSITIONING LENGTH 30 (MISCELLANEOUS) ×1 IMPLANT
STOPCOCK MORSE 400PSI 3WAY (MISCELLANEOUS) ×6 IMPLANT
SYR MEDRAD MARK V 150ML (SYRINGE) ×1 IMPLANT
TRANSDUCER W/STOPCOCK (MISCELLANEOUS) ×6 IMPLANT
WIRE AMPLATZ SS-J .035X180CM (WIRE) ×1 IMPLANT
WIRE EMERALD 3MM-J .035X150CM (WIRE) ×1 IMPLANT
WIRE EMERALD 3MM-J .035X260CM (WIRE) ×1 IMPLANT
WIRE EMERALD ST .035X260CM (WIRE) ×4 IMPLANT
WIRE SAFARI SM CURVE 275 (WIRE) ×1 IMPLANT

## 2022-05-26 NOTE — Anesthesia Postprocedure Evaluation (Signed)
Anesthesia Post Note  Patient: Mitchell Humphrey  Procedure(s) Performed: Transcatheter Aortic Valve Replacement, Transfemoral INTRAOPERATIVE TRANSTHORACIC ECHOCARDIOGRAM     Patient location during evaluation: Cath Lab Anesthesia Type: MAC Level of consciousness: awake Pain management: pain level controlled Vital Signs Assessment: post-procedure vital signs reviewed and stable Respiratory status: spontaneous breathing, nonlabored ventilation, respiratory function stable and patient connected to nasal cannula oxygen Cardiovascular status: stable and blood pressure returned to baseline Postop Assessment: no apparent nausea or vomiting Anesthetic complications: no   There were no known notable events for this encounter.  Last Vitals:  Vitals:   05/26/22 1711 05/26/22 1715  BP: 129/70 130/67  Pulse: (!) 58 62  Resp: 16 13  Temp: (!) 36.3 C   SpO2: 100% 99%    Last Pain:  Vitals:   05/26/22 1711  TempSrc: Oral  PainSc:                  Samuel Mcpeek P Christin Moline

## 2022-05-26 NOTE — CV Procedure (Signed)
[facility]  TAVR OPERATIVE NOTE   Date of Procedure:  05/26/2022  Preoperative Diagnosis: Severe Aortic Stenosis   Postoperative Diagnosis: Same   Procedure:   Transcatheter Aortic Valve Replacement - Transfemoral Approach  Edwards Sapien 3 THV (size 29 mm, model # [id number], serial # [id number])   Co-Surgeons:  Lauree Chandler, MD and Gaye Pollack, MD   Anesthesiologist:  Ellender  Echocardiographer:  Croitoru  Pre-operative Echo Findings: Severe aortic stenosis Normal left ventricular systolic function  Post-operative Echo Findings: No paravalvular leak Normal left ventricular systolic function  BRIEF CLINICAL NOTE AND INDICATIONS FOR SURGERY  81 yo male with history of arthritis, BPH, hyperlipidemia, GERD, HTN, hypothyroidism and severe aortic stenosis who is here today for TAVR. Echo 12/26/21 with LVEF=60-65%. Normal RV function. Mild mitral regurgitation. The aortic valve is thickened and calcified with limited leaflet excursion. Mean gradient 26.5 mmHg, peak gradient 46 mmHg, AVA 1.2 cm2, DI 0.20, SVI 45. CT cardiac calcium scoring with AV calcium score of 2428 and evidence of coronary artery calcification. Cardiac cath with mild non-obstructive CAD. He has had progressive dyspnea on exertion and fatigue.  During the course of the patient's preoperative work up they have been evaluated comprehensively by a multidisciplinary team of specialists coordinated through the [facility] the [facility].  They have been demonstrated to suffer from symptomatic severe aortic stenosis as noted above. The patient has been counseled extensively as to the relative risks and benefits of all options for the treatment of severe aortic stenosis including long term medical therapy, conventional surgery for aortic valve replacement, and transcatheter aortic valve replacement.  The patient has been independently evaluated by  Dr. Cyndia Bent with CT surgery and they are felt to be at high risk for conventional surgical aortic valve replacement. The surgeon indicated the patient would be a poor candidate for conventional surgery. Based upon review of all of the patient's preoperative diagnostic tests they are felt to be candidate for transcatheter aortic valve replacement using the transfemoral approach as an alternative to high risk conventional surgery.    Following the decision to proceed with transcatheter aortic valve replacement, a discussion has been held regarding what types of management strategies would be attempted intraoperatively in the event of life-threatening complications, including whether or not the patient would be considered a candidate for the use of cardiopulmonary bypass and/or conversion to open sternotomy for attempted surgical intervention.  The patient has been advised of a variety of complications that might develop peculiar to this approach including but not limited to risks of death, stroke, paravalvular leak, aortic dissection or other major vascular complications, aortic annulus rupture, device embolization, cardiac rupture or perforation, acute myocardial infarction, arrhythmia, heart block or bradycardia requiring permanent pacemaker placement, congestive heart failure, respiratory failure, renal failure, pneumonia, infection, other late complications related to structural valve deterioration or migration, or other complications that might ultimately cause a temporary or permanent loss of functional independence or other long term morbidity.  The patient provides full informed consent for the procedure as described and all questions were answered preoperatively.    DETAILS OF THE OPERATIVE PROCEDURE  PREPARATION:   The patient is brought to the operating room on the above mentioned date and central monitoring was established by the anesthesia team including placement of a radial arterial line. The  patient is placed in the supine position on the operating table.  Intravenous antibiotics are administered. Conscious sedation is used.  Baseline transthoracic echocardiogram was performed. The patient's chest, abdomen, both groins, and both lower extremities are prepared and draped in a sterile manner. A time out procedure is performed.   PERIPHERAL ACCESS:   Using the modified Seldinger technique, femoral arterial and venous access were obtained with placement of a 6 Fr sheath in the artery and a 7 Fr sheath in the vein on the left side using u/s guidance.  A pigtail diagnostic catheter was passed through the femoral arterial sheath under fluoroscopic guidance into the aortic root.  A temporary transvenous pacemaker catheter was passed through the femoral venous sheath under fluoroscopic guidance into the right ventricle.  The pacemaker was tested to ensure stable lead placement and pacemaker capture. Aortic root angiography was performed in order to determine the optimal angiographic angle for valve deployment.  TRANSFEMORAL ACCESS:  A micropuncture kit was used to gain access to the right femoral artery using u/s guidance. Position confirmed with angiography. Pre-closure with double ProGlide closure devices. The patient was heparinized systemically and ACT verified > 250 seconds.    A 16 Fr transfemoral E-sheath was introduced into the right femoral artery after progressively dilating over an Amplatz superstiff wire. An AL-2 catheter was used to direct a straight-tip exchange length wire across the native aortic valve into the left ventricle. This was exchanged out for a pigtail catheter and position was confirmed in the LV apex. Simultaneous LV and Ao pressures were recorded.  The pigtail catheter was then exchanged for a Safari wire in the LV apex.   TRANSCATHETER HEART VALVE DEPLOYMENT:  An Edwards Sapien 3 THV (size 29 mm) was prepared and crimped per manufacturer's guidelines, and the proper  orientation of the valve is confirmed on the [employer] delivery system. The valve was advanced through the introducer sheath using normal technique until in an appropriate position in the abdominal aorta beyond the sheath tip. The balloon was then retracted and using the fine-tuning wheel was centered on the valve. The valve was then advanced across the aortic arch using appropriate flexion of the catheter. The valve was carefully positioned across the aortic valve annulus. The Commander catheter was retracted using normal technique. Once final position of the valve has been confirmed by angiographic assessment, the valve is deployed while temporarily holding ventilation and during rapid ventricular pacing to maintain systolic blood pressure < 50 mmHg and pulse pressure < 10 mmHg. The balloon inflation is held for >3 seconds after reaching full deployment volume. Once the balloon has fully deflated the balloon is retracted into the ascending aorta and valve function is assessed using TTE. There is felt to be no paravalvular leak and no central aortic insufficiency.  The patient's hemodynamic recovery following valve deployment is good.  The deployment balloon and guidewire are both removed. Echo demostrated acceptable post-procedural gradients, stable mitral valve function, and no AI.   PROCEDURE COMPLETION:  The sheath was then removed and closure devices were completed. Protamine was administered once femoral arterial repair was complete. The temporary pacemaker, pigtail catheters and femoral sheaths were removed with a Mynx closure device placed in the artery and manual pressure used for venous hemostasis.    The patient tolerated the procedure well and is transported to the surgical intensive care in stable condition. There were no immediate intraoperative complications. All sponge instrument and needle counts are verified correct at completion of the operation.   No blood products were  administered during the operation.  The patient received a total of 60 mL  of intravenous contrast during the procedure.  Lauree Chandler MD 05/26/2022 1:47 PM

## 2022-05-26 NOTE — Progress Notes (Signed)
  Echocardiogram 2D Echocardiogram has been performed.  Mitchell Humphrey 05/26/2022, 1:36 PM

## 2022-05-26 NOTE — Progress Notes (Signed)
  [place] VALVE TEAM  Patient doing well s/p TAVR. He is hemodynamically stable. Groin sites are stable. ECG with NSR and 1st degree AV block however no high grade block. Plan to DC arterial line and transfer to 4E.  Plan for early ambulation after bedrest completed and hopeful discharge over the next 24-48 hours.   Kathyrn Drown NP-C Structural Heart Team  Pager: [phone] Phone: [phone]

## 2022-05-26 NOTE — Discharge Instructions (Signed)

## 2022-05-26 NOTE — Progress Notes (Signed)
On room air, O2 sats 98-99%. When he goes to sleep, O2 sats decrease then come back up when asked to take a deep breath. Patient seemed agitated with me asking him questions. Appears tired. Speech clear and appropriate, no deficits. Dr. Angelena Form paged and made aware.

## 2022-05-26 NOTE — Anesthesia Procedure Notes (Signed)
Procedure Name: MAC Date/Time: 05/26/2022 12:10 PM  Performed by: Inda Coke, CRNAPre-anesthesia Checklist: Patient identified, Emergency Drugs available, Suction available, Timeout performed and Patient being monitored Patient Re-evaluated:Patient Re-evaluated prior to induction Oxygen Delivery Method: Simple face mask Induction Type: IV induction Dental Injury: Teeth and Oropharynx as per pre-operative assessment

## 2022-05-26 NOTE — Progress Notes (Signed)
Patient arrived from cath lab to 4e12. Patient with B groins level zero at this time. Vital signs obtained and patient placed on monitor and CCMD made aware. Chamari Cutbirth, [employer]

## 2022-05-26 NOTE — Progress Notes (Signed)
Mobility Specialist: Progress Note   05/26/22 1807  Mobility  Activity Ambulated with assistance in hallway  Level of Assistance Contact guard assist, steadying assist  Assistive Device Other (Comment) (IV pole)  Distance Ambulated (ft) 180 ft  Activity Response Tolerated well  $Mobility charge 1 Mobility   Pre-Mobility: 65 HR, 128/67 (85) BP, 97% SpO2 Post-Mobility: 72 HR, 137/68 (89) BP, 100% SpO2  Received pt in bed having no complaints and agreeable to mobility. Pt was asymptomatic throughout ambulation and returned to room w/o fault. To BR per request, void successful, then back to bed. Left in bed w/ call bell in reach and all needs met. RN present in the room.   [facility] Milt Coye Mobility Specialist Mobility Specialist [address]: [phone]

## 2022-05-26 NOTE — Progress Notes (Signed)
Patient has been resting quietly w/eyes closed, keeping sats up 89-99%. Dr. Angelena Form by to talk w/patient.

## 2022-05-26 NOTE — Op Note (Signed)
[facility]   MULTIDISCIPLINARY HEART VALVE TEAM   TAVR OPERATIVE NOTE   Date of Procedure:  05/26/2022  Preoperative Diagnosis: Severe Aortic Stenosis   Postoperative Diagnosis: Same   Procedure:   Transcatheter Aortic Valve Replacement - Percutaneous Right Transfemoral Approach  Edwards Sapien 3 Ultra Resilia THV (size 29 mm, model # 9755RSL, serial # [id number])   Co-Surgeons:  Gaye Pollack, MD  Lauree Chandler, MD   Anesthesiologist:  Adele Barthel, MD  Echocardiographer:  Sanda Klein, MD  Pre-operative Echo Findings: Severe aortic stenosis Normal left ventricular systolic function  Post-operative Echo Findings: No paravalvular leak Normal left ventricular systolic function   BRIEF CLINICAL NOTE AND INDICATIONS FOR SURGERY  This 81 year old gentleman has stage D3, severe, symptomatic aortic stenosis with [place] Heart Association class III symptoms of exertional fatigue and shortness of breath consistent with chronic diastolic congestive heart failure.  I have personally reviewed his 2D echocardiogram, cardiac catheterization, and CTA studies.  Echocardiogram shows a severely thickened and calcified aortic valve with restricted leaflet mobility.  The mean gradient was 27 mmHg with a dimensionless index of 0.22 and a valve area measured at 1.4 cm.  His gated cardiac CTA shows a trileaflet aortic valve with severe thickening and calcification with restricted leaflet mobility.  His aortic valve calcium score was consistent with severe aortic stenosis.  Cardiac catheterization shows mild nonobstructive coronary disease.  I agree that aortic valve replacement is indicated in this patient for relief of his symptoms and to prevent left ventricular dysfunction.  Given his advanced age I think that transcatheter aortic valve replacement would be the best option for him.  His gated cardiac CTA shows anatomy suitable for TAVR using a SAPIEN 3 valve.  His  abdominal and pelvic CTA shows adequate pelvic vascular anatomy to allow transfemoral insertion.  There is some intraluminal calcification in the right common femoral artery but we should be able to get in above that.   The patient and his wife were counseled at length regarding treatment alternatives for management of severe symptomatic aortic stenosis. The risks and benefits of surgical intervention has been discussed in detail. Long-term prognosis with medical therapy was discussed. Alternative approaches such as conventional surgical aortic valve replacement, transcatheter aortic valve replacement, and palliative medical therapy were compared and contrasted at length. This discussion was placed in the context of the patient's own specific clinical presentation and past medical history. All of their questions have been addressed.    Following the decision to proceed with transcatheter aortic valve replacement, a discussion was held regarding what types of management strategies would be attempted intraoperatively in the event of life-threatening complications, including whether or not the patient would be considered a candidate for the use of cardiopulmonary bypass and/or conversion to open sternotomy for attempted surgical intervention.  I think he would be a candidate for emergent sternotomy to manage any intraoperative complications.  Although he had fairly severe COVID-pneumonia his recent pulmonary function testing is normal.  The patient is aware of the fact that transient use of cardiopulmonary bypass may be necessary. The patient has been advised of a variety of complications that might develop including but not limited to risks of death, stroke, paravalvular leak, aortic dissection or other major vascular complications, aortic annulus rupture, device embolization, cardiac rupture or perforation, mitral regurgitation, acute myocardial infarction, arrhythmia, heart block or bradycardia requiring  permanent pacemaker placement, congestive heart failure, respiratory failure, renal failure, pneumonia, infection, other late complications related to structural  valve deterioration or migration, or other complications that might ultimately cause a temporary or permanent loss of functional independence or other long term morbidity. The patient provides full informed consent for the procedure as described and all questions were answered.     DETAILS OF THE OPERATIVE PROCEDURE  PREPARATION:    The patient was brought to the operating room on the above mentioned date and appropriate monitoring was established by the anesthesia team. The patient was placed in the supine position on the operating table.  Intravenous antibiotics were administered. The patient was monitored closely throughout the procedure under conscious sedation.   Baseline transthoracic echocardiogram was performed. The patient's abdomen and both groins were prepped and draped in a sterile manner. A time out procedure was performed.   PERIPHERAL ACCESS:    Using the modified Seldinger technique, femoral arterial and venous access was obtained with placement of 6 Fr sheaths on the left side.  A pigtail diagnostic catheter was passed through the left arterial sheath under fluoroscopic guidance into the aortic root.  A temporary transvenous pacemaker catheter was passed through the left femoral venous sheath under fluoroscopic guidance into the right ventricle.  The pacemaker was tested to ensure stable lead placement and pacemaker capture. Aortic root angiography was performed in order to determine the optimal angiographic angle for valve deployment.   TRANSFEMORAL ACCESS:   Percutaneous transfemoral access and sheath placement was performed using ultrasound guidance.  The right common femoral artery was cannulated using a micropuncture needle and appropriate location was verified using hand injection angiogram.  A pair of Abbott  Perclose percutaneous closure devices were placed and a 6 French sheath replaced into the femoral artery.  The patient was heparinized systemically and ACT verified > 250 seconds.    A 16 Fr transfemoral E-sheath was introduced into the right common femoral artery after progressively dilating over an Amplatz superstiff wire. An AL-2 catheter was used to direct a straight-tip exchange length wire across the native aortic valve into the left ventricle. This was exchanged out for a pigtail catheter and position was confirmed in the LV apex. Simultaneous LV and Ao pressures were recorded.  The pigtail catheter was exchanged for a Safari wire in the LV apex.   BALLOON AORTIC VALVULOPLASTY:   Not performed   TRANSCATHETER HEART VALVE DEPLOYMENT:   An Edwards Sapien 3 Ultra transcatheter heart valve (size 29 mm) was prepared and crimped per manufacturer's guidelines, and the proper orientation of the valve is confirmed on the [employer] delivery system. The valve was advanced through the introducer sheath using normal technique until in an appropriate position in the abdominal aorta beyond the sheath tip. The balloon was then retracted and using the fine-tuning wheel was centered on the valve. The valve was then advanced across the aortic arch using appropriate flexion of the catheter. The valve was carefully positioned across the aortic valve annulus. The Commander catheter was retracted using normal technique. Once final position of the valve has been confirmed by angiographic assessment, the valve is deployed during rapid ventricular pacing to maintain systolic blood pressure < 50 mmHg and pulse pressure < 10 mmHg. The balloon inflation is held for >3 seconds after reaching full deployment volume. Once the balloon has fully deflated the balloon is retracted into the ascending aorta and valve function is assessed using echocardiography. There is felt to be no paravalvular leak and no central aortic  insufficiency.  The patient's hemodynamic recovery following valve deployment is good.  The deployment  balloon and guidewire are both removed.    PROCEDURE COMPLETION:   The sheath was removed and femoral artery closure performed.  Protamine was administered once femoral arterial repair was complete. The temporary pacemaker, pigtail catheter and femoral sheaths were removed with manual pressure used for venous hemostasis.  A Mynx femoral closure device was utilized following removal of the diagnostic sheath in the left femoral artery.  The patient tolerated the procedure well and is transported to the cath lab recovery area in stable condition. There were no immediate intraoperative complications. All sponge instrument and needle counts are verified correct at completion of the operation.   No blood products were administered during the operation.  The patient received a total of 60 mL of intravenous contrast during the procedure.   Gaye Pollack, MD 05/26/2022

## 2022-05-26 NOTE — Interval H&P Note (Signed)
History and Physical Interval Note:  05/26/2022 10:57 AM  Mitchell Humphrey  has presented today for surgery, with the diagnosis of Severe Aortic Stenosis.  The various methods of treatment have been discussed with the patient and family. After consideration of risks, benefits and other options for treatment, the patient has consented to  Procedure(s): Transcatheter Aortic Valve Replacement, Transfemoral (N/A) INTRAOPERATIVE TRANSTHORACIC ECHOCARDIOGRAM (N/A) as a surgical intervention.  The patient's history has been reviewed, patient examined, no change in status, stable for surgery.  I have reviewed the patient's chart and labs.  Questions were answered to the patient's satisfaction.     Gaye Pollack

## 2022-05-26 NOTE — Transfer of Care (Signed)
Immediate Anesthesia Transfer of Care Note  Patient: Mitchell Humphrey  Procedure(s) Performed: Transcatheter Aortic Valve Replacement, Transfemoral INTRAOPERATIVE TRANSTHORACIC ECHOCARDIOGRAM  Patient Location: PACU and Cath Lab  Anesthesia Type:MAC  Level of Consciousness: awake and drowsy  Airway & Oxygen Therapy: Patient Spontanous Breathing and Patient connected to nasal cannula oxygen  Post-op Assessment: Report given to RN and Post -op Vital signs reviewed and stable  Post vital signs: Reviewed and stable  Last Vitals:  Vitals Value Taken Time  BP    Temp 36.2 C 05/26/22 1351  Pulse    Resp    SpO2      Last Pain:  Vitals:   05/26/22 1351  TempSrc: Temporal  PainSc:          Complications: There were no known notable events for this encounter.

## 2022-05-26 NOTE — Anesthesia Procedure Notes (Signed)
Arterial Line Insertion Start/End7/09/2022 11:15 AM, 05/26/2022 11:25 AM Performed by: Moshe Salisbury, CRNA, CRNA  Patient location: Pre-op. Preanesthetic checklist: patient identified, IV checked, site marked, risks and benefits discussed, surgical consent, monitors and equipment checked, pre-op evaluation, timeout performed and anesthesia consent Lidocaine 1% used for infiltration Right, radial was placed Catheter size: 20 G Hand hygiene performed , maximum sterile barriers used  and Seldinger technique used  Attempts: 1 Procedure performed without using ultrasound guided technique. Following insertion, dressing applied and Biopatch. Post procedure assessment: normal  Patient tolerated the procedure well with no immediate complications.

## 2022-05-27 ENCOUNTER — Encounter (HOSPITAL_COMMUNITY): Payer: Self-pay | Admitting: Cardiovascular Disease

## 2022-05-27 ENCOUNTER — Inpatient Hospital Stay (HOSPITAL_BASED_OUTPATIENT_CLINIC_OR_DEPARTMENT_OTHER)
Admission: RE | Admit: 2022-05-27 | Discharge: 2022-05-27 | Disposition: A | Discharge status: Home / Self Care | Payer: Medicare PPO | Source: Home / Self Care | Attending: Cardiology | Admitting: Cardiology

## 2022-05-27 ENCOUNTER — Inpatient Hospital Stay (HOSPITAL_COMMUNITY): Payer: Medicare PPO

## 2022-05-27 DIAGNOSIS — Z8616 Personal history of COVID-19: Secondary | ICD-10-CM | POA: Diagnosis not present

## 2022-05-27 DIAGNOSIS — I350 Nonrheumatic aortic (valve) stenosis: Secondary | ICD-10-CM | POA: Diagnosis not present

## 2022-05-27 DIAGNOSIS — Z006 Encounter for examination for normal comparison and control in clinical research program: Secondary | ICD-10-CM | POA: Diagnosis not present

## 2022-05-27 DIAGNOSIS — Z952 Presence of prosthetic heart valve: Secondary | ICD-10-CM

## 2022-05-27 DIAGNOSIS — I493 Ventricular premature depolarization: Secondary | ICD-10-CM

## 2022-05-27 DIAGNOSIS — I130 Hypertensive heart and chronic kidney disease with heart failure and stage 1 through stage 4 chronic kidney disease, or unspecified chronic kidney disease: Secondary | ICD-10-CM | POA: Diagnosis not present

## 2022-05-27 DIAGNOSIS — I440 Atrioventricular block, first degree: Secondary | ICD-10-CM | POA: Diagnosis not present

## 2022-05-27 LAB — ECHOCARDIOGRAM COMPLETE
AR max vel: 2.59 cm2
AV Area VTI: 2.66 cm2
AV Area mean vel: 2.63 cm2
AV Mean grad: 7.5 mmHg
AV Peak grad: 13.5 mmHg
Ao pk vel: 1.84 m/s
Area-P 1/2: 6.6 cm2
Calc EF: 59 %
Height: 69 in
S' Lateral: 3.2 cm
Single Plane A2C EF: 56.8 %
Single Plane A4C EF: 58.8 %
Weight: 3943.59 oz

## 2022-05-27 LAB — BASIC METABOLIC PANEL WITH GFR
Anion gap: 7 (ref 5–15)
BUN: 14 mg/dL (ref 8–23)
CO2: 23 mmol/L (ref 22–32)
Calcium: 8.8 mg/dL — ABNORMAL LOW (ref 8.9–10.3)
Chloride: 107 mmol/L (ref 98–111)
Creatinine, Ser: 0.92 mg/dL (ref 0.61–1.24)
GFR, Estimated: >60 mL/min
Glucose, Bld: 109 mg/dL — ABNORMAL HIGH (ref 70–99)
Potassium: 3.9 mmol/L (ref 3.5–5.1)
Sodium: 137 mmol/L (ref 135–145)

## 2022-05-27 LAB — CBC
HCT: 36.5 % — ABNORMAL LOW (ref 39.0–52.0)
Hemoglobin: 12 g/dL — ABNORMAL LOW (ref 13.0–17.0)
MCH: 28 pg (ref 26.0–34.0)
MCHC: 32.9 g/dL (ref 30.0–36.0)
MCV: 85.3 fL (ref 80.0–100.0)
Platelets: 200 10*3/uL (ref 150–400)
RBC: 4.28 MIL/uL (ref 4.22–5.81)
RDW: 13.6 % (ref 11.5–15.5)
WBC: 9 10*3/uL (ref 4.0–10.5)
nRBC: 0 % (ref 0.0–0.2)

## 2022-05-27 LAB — MAGNESIUM: Magnesium: 2 mg/dL (ref 1.7–2.4)

## 2022-05-27 NOTE — Progress Notes (Signed)
ZIO AT applied at hospital  Dr. Farris Has to read.

## 2022-05-27 NOTE — Progress Notes (Signed)
Mobility Specialist: Progress Note   05/27/22 1118  Mobility  Activity Ambulated with assistance in hallway  Level of Assistance Contact guard assist, steadying assist  Assistive Device Front wheel walker  Distance Ambulated (ft) 330 ft  Activity Response Tolerated well  $Mobility charge 1 Mobility   Pre-Mobility: 87 HR, 149/72 (93) BP, 95% SpO2 During Mobility: 103 HR Post-Mobility: 89 HR, 150/83 (102) BP, 95% SpO2  Received pt in bed having no complaints and agreeable to mobility. Contact guard d/t mild unsteadiness. Pt was asymptomatic throughout ambulation and returned to room w/o fault. Left in bed w/ call bell in reach and all needs met.  [facility] Yvan Dority Mobility Specialist Mobility Specialist [address]: [phone]

## 2022-05-27 NOTE — TOC Initial Note (Signed)
Transition of Care [facility]) - Initial/Assessment Note    Patient Details  Name: Mitchell Humphrey MRN: [id number] Date of Birth: May 19, 1941  [facility]) CM/SW Contact:    Ninfa Meeker, RN Phone Number: 05/27/2022, 11:42 AM  Clinical Narrative:                 Transition of Care Screening Note:  Transition of Care Department [facility]) has reviewed patient and no TOC needs have been identified at this time. We will continue to monitor patient advancement through Interdisciplinary progressions. If new patient transition needs arise, please place a consult.         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/2022   ILD (interstitial lung disease) ([place]) 03/27/2022   Severe aortic stenosis    Adrenal adenoma, 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   DVT (deep venous thrombosis) ([place]) 02/10/2021   COVID-19 virus infection 12/07/2020   Insulin resistance 10/23/2020   Severe obesity (BMI 35.0-35.9 with comorbidity) ([place]) 08/05/2020   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   GERD (gastroesophageal reflux disease) 12/20/2018   Chronic low back pain 05/18/2018   Hypercholesterolemia 08/09/2017   BPH (benign prostatic hyperplasia) 08/09/2017   Osteoarthritis of both hands 08/09/2017   Osteoarthritis, 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   History of melanoma 02/04/2015   Elevated prostate specific antigen (PSA) 03/15/2013   Increased frequency of  urination 03/15/2013   Urge incontinence 03/15/2013   Benign essential hypertension 05/25/2012   Hypothyroidism (acquired) 05/25/2012   Dyslipidemia 05/25/2012   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

## 2022-05-27 NOTE — Plan of Care (Signed)
  Problem: Education: Goal: Knowledge of General Education information will improve Description: Including pain rating scale, medication(s)/side effects and non-pharmacologic comfort measures Outcome: Progressing   Problem: Health Behavior/Discharge Planning: Goal: Ability to manage health-related needs will improve Outcome: Progressing   Problem: Clinical Measurements: Goal: Will remain free from infection Outcome: Progressing   Problem: Activity: Goal: Risk for activity intolerance will decrease Outcome: Progressing   Problem: Elimination: Goal: Will not experience complications related to bowel motility Outcome: Progressing

## 2022-05-27 NOTE — Progress Notes (Signed)
CARDIAC REHAB PHASE I   Offered to walk with pt, pt states recent ambulation with mobility team. Pt states improvement in breathing. Reviewed site care, restrictions, and exercise guidelines with pt and wife. Will refer to CRP II [place] Rufina Falco, RN BSN 05/27/2022 12:18 PM

## 2022-05-27 NOTE — Discharge Summary (Addendum)
[facility] VALVE TEAM  Discharge Summary    Patient ID: Mitchell Humphrey MRN: [id number]; DOB: 1940-11-24  Admit date: 05/26/2022 Discharge date: 05/27/2022  Primary Care Provider: Inda Coke, PA  Primary Cardiologist: Dr. Audie Box, MD/ Dr, Angelena Form, MD and Dr. Cyndia Bent, MD (TAVR)  Discharge Diagnoses    Principal Problem:   S/P TAVR (transcatheter aortic valve replacement) Active Problems:   Benign essential hypertension   Hypothyroidism (acquired)   Dyslipidemia   Severe obesity (BMI 35.0-35.9 with comorbidity) ([place])   DVT (deep venous thrombosis) (HCC)   Severe aortic stenosis   ILD (interstitial lung disease) (HCC)   PVC (premature ventricular contraction)   1st degree AV block  Allergies Allergies  Allergen Reactions   Penicillins Rash   Diagnostic Studies/Procedures     TAVR OPERATIVE NOTE     Date of Procedure:                05/26/2022   Preoperative Diagnosis:      Severe Aortic Stenosis    Postoperative Diagnosis:    Same    Procedure:        Transcatheter Aortic Valve Replacement - Percutaneous Right Transfemoral Approach             Edwards Sapien 3 Ultra Resilia THV (size 29 mm, model # 9755RSL, serial # [id number])              Co-Surgeons:                        Gaye Pollack, MD  Lauree Chandler, MD     Anesthesiologist:                  Adele Barthel, MD   Echocardiographer:              Sanda Klein, MD   Pre-operative Echo Findings: Severe aortic stenosis Normal left ventricular systolic function   Post-operative Echo Findings: No paravalvular leak Normal left ventricular systolic function  ___________   Echo 05/27/22: Completed but pending formal read at the time of discharge   History of Present Illness     Mitchell Humphrey is a 81 y.o. male with a history of arthritis, BPH, HLD, chronic diastolic CHF, GERD, HTN, hypothyroidism and severe aortic stenosis who presented to [facility] on 05/26/22  for planned TAVR.   Hospital Course     Severe AS: s/p successful TAVR with a 29 mm Edwards Sapien 3 UR via the TF  approach on 05/26/22. Post operative echo pending. Groin sites are stable. ECG and telemetry with 1st degree AV block and PVCs however no high grade heart block. ZIO was placed at hospital discharge. Continue ASA. He has ambulated with CRI with no issues. Post procedure instructions reviewed with understanding. SBE discussed and will be RX'ed at follow up next week. He has a PCN allergy.   Chronic diastolic CHF: appears euvolemic today on exam.   HTN: BP elevated today and patient placed on IV NTG overnight likely in the setting of held antihypertensives. Wean IV NTG and restart home medications including enalapril, HCTZ. Creatinine stable at 0.92.  HLD: Continue Lipitor   BPH: Restart home medications.   Consultants: None    The patient was seen and examined by Dr. Angelena Form who feels that he is stable and ready for discharge today, 05/27/22.  _____________  Discharge Vitals Blood pressure (!) 155/74, pulse 100, temperature (!) 100.6 F (38.1 C),  temperature source Oral, resp. rate 18, height '5\' 9"'$  (1.753 m), weight 111.8 kg, SpO2 92 %.  Filed Weights   05/26/22 1034 05/27/22 0500  Weight: 109.8 kg 111.8 kg   General: Well developed, well nourished, NAD Lungs:Clear to ausculation bilaterally. No wheezes, rales, or rhonchi. Breathing is unlabored. Cardiovascular: RRR with S1 S2. No murmurs Extremities: No edema.  Neuro: Alert and oriented. No focal deficits. No facial asymmetry. MAE spontaneously. Psych: Responds to questions appropriately with normal affect.    Labs & Radiologic Studies    CBC Recent Labs    05/26/22 1402 05/27/22 0321  WBC  --  9.0  HGB 11.9* 12.0*  HCT 35.0* 36.5*  MCV  --  85.3  PLT  --  767   Basic Metabolic Panel Recent Labs    05/26/22 1402 05/27/22 0321  NA 141 137  K 4.2 3.9  CL 103 107  CO2  --  23  GLUCOSE 133* 109*  BUN  13 14  CREATININE 0.80 0.92  CALCIUM  --  8.8*  MG  --  2.0   Liver Function Tests No results for input(s): "AST", "ALT", "ALKPHOS", "BILITOT", "PROT", "ALBUMIN" in the last 72 hours. No results for input(s): "LIPASE", "AMYLASE" in the last 72 hours. Cardiac Enzymes No results for input(s): "CKTOTAL", "CKMB", "CKMBINDEX", "TROPONINI" in the last 72 hours. BNP Invalid input(s): "POCBNP" D-Dimer No results for input(s): "DDIMER" in the last 72 hours. Hemoglobin A1C No results for input(s): "HGBA1C" in the last 72 hours. Fasting Lipid Panel No results for input(s): "CHOL", "HDL", "LDLCALC", "TRIG", "CHOLHDL", "LDLDIRECT" in the last 72 hours. Thyroid Function Tests No results for input(s): "TSH", "T4TOTAL", "T3FREE", "THYROIDAB" in the last 72 hours.  Invalid input(s): "FREET3" _____________  ECHOCARDIOGRAM LIMITED  Result Date: 05/26/2022    ECHOCARDIOGRAM LIMITED REPORT   Patient Name:   Mitchell Humphrey Date of Exam: 05/26/2022 Medical Rec #:  [id number]    Height:       69.0 in Accession #:    [id number]   Weight:       242.0 lb Date of Birth:  11/06/1941   BSA:          2.240 m Patient Age:    81 years     BP:           126/62 mmHg Patient Gender: M            HR:           55 bpm. Exam Location:  Inpatient Procedure: Limited Echo, Cardiac Doppler and Color Doppler Indications:     I35.0 Nonrheumatic aortic (valve) stenosis  History:         Patient has prior history of Echocardiogram examinations.                  Aortic Valve Disease; Risk Factors:Hypertension and                  Dyslipidemia.                  Aortic Valve: 29 mm Sapien prosthetic, stented (TAVR) valve is                  present in the aortic position. Procedure Date: 05/26/2022.  Sonographer:     Roseanna Rainbow RDCS Referring Phys:  [place] Diagnosing Phys: Sanda Klein MD  Sonographer Comments: Technically difficult study due to poor echo windows. Image acquisition challenging due to patient body habitus.  TAVR procedure. PREPROCEDURAL FINDINGS Normal left ventricular systolic function and regional wall motion. Estimated LVEF 60%. Trileaflet aortic valve with severe calcific aortic stenosis. Peak aortic valve gradient 34 mm Hg, mean gradient 20 mm Hg, likely underestimated due to poor Doppler beam alignment, acceleration time 125 ms. Trivial aortic insufficiency. Mild mitral insufficiency. No pericardial effusion.  POSTPROCEDURAL FINDINGS Normal left ventricular systolic function and regional wall motion. Estimated LVEF 60%. Well seated Edwards S3U 29 mm TAVR stent-valve. Peak aortic valve gradient 7 mm Hg, mean gradient 4 mm Hg, dimensionless index 0.77, calculated aortic valve area 3.42 cm2 (1.53 cm2/m2 indexed for BSA), acceleration time 78 ms. No central aortic insufficiency, after guidewire removal. There is trivial probably perivalvular leak at the end of the procedure. Trivial mitral insufficiency. No pericardial effusion. IMPRESSIONS  1. Left ventricular ejection fraction, by estimation, is 60 to 65%. The left ventricle has normal function. The left ventricle has no regional wall motion abnormalities.  2. Right ventricular systolic function is normal. The right ventricular size is normal.  3. The mitral valve is normal in structure. Mild mitral valve regurgitation.  4. There is a 29 mm Sapien prosthetic (TAVR) valve present in the aortic position. Procedure Date: 05/26/2022. FINDINGS  Left Ventricle: Left ventricular ejection fraction, by estimation, is 60 to 65%. The left ventricle has normal function. The left ventricle has no regional wall motion abnormalities. Right Ventricle: The right ventricular size is normal. Right ventricular systolic function is normal. Pericardium: There is no evidence of pericardial effusion. Mitral Valve: The mitral valve is normal in structure. Mild mitral valve regurgitation. Tricuspid Valve: The tricuspid valve is normal in structure. Tricuspid valve regurgitation is not  demonstrated. Aortic Valve: Aortic valve mean gradient measures 4.0 mmHg. Aortic valve peak gradient measures 7.4 mmHg. Aortic valve area, by VTI measures 1.95 cm. There is a 29 mm Sapien prosthetic, stented (TAVR) valve present in the aortic position. Procedure Date: 05/26/2022. Pulmonic Valve: The pulmonic valve was normal in structure. Pulmonic valve regurgitation is not visualized. Aorta: The aortic root and ascending aorta are structurally normal, with no evidence of dilitation. LEFT VENTRICLE PLAX 2D LVOT diam:     2.40 cm LV SV:         126 LV SV Index:   56 LVOT Area:     4.52 cm  LV Volumes (MOD) LV vol d, MOD A2C: 92.5 ml LV vol d, MOD A4C: 103.0 ml LV vol s, MOD A2C: 46.9 ml LV vol s, MOD A4C: 39.5 ml LV SV MOD A2C:     45.6 ml LV SV MOD A4C:     103.0 ml LV SV MOD BP:      53.4 ml AORTIC VALVE AV Area (Vmax):    3.89 cm AV Area (Vmean):   1.88 cm AV Area (VTI):     1.95 cm AV Vmax:           136.00 cm/s AV Vmean:          181.620 cm/s AV VTI:            0.645 m AV Peak Grad:      7.4 mmHg AV Mean Grad:      4.0 mmHg LVOT Vmax:         117.00 cm/s LVOT Vmean:        75.500 cm/s LVOT VTI:          0.278 m LVOT/AV VTI ratio: 0.43  AORTA Ao Asc diam: 3.20 cm  SHUNTS Systemic VTI:  0.28 m Systemic Diam: 2.40 cm Sanda Klein MD Electronically signed by Sanda Klein MD Signature Date/Time: 05/26/2022/5:05:49 PM    Final    Structural Heart Procedure  Result Date: 05/26/2022 Severe aortic stenosis Successful TAVR with placement of a 29 mm Edwards Sapien 3 Ultra valve from the right transfemoral approach.   DG Chest 2 View  Result Date: 05/22/2022 CLINICAL DATA:  Preoperative for TAVR. EXAM: CHEST - 2 VIEW COMPARISON:  None Available. FINDINGS: The heart size and mediastinal contours are within normal limits. There is some scarring in the lung bases. The lungs are otherwise clear. There is no pleural effusion or pneumothorax. Visualized skeletal structures are unremarkable. There are surgical clips  in the upper abdomen. IMPRESSION: No active cardiopulmonary disease. Electronically Signed   By: Ronney Asters M.D.   On: 05/22/2022 23:05   VAS [place] LE ART SEG MULTI (Segm&LE Reynauds)  Result Date: 05/15/2022  LOWER EXTREMITY DOPPLER STUDY Patient Name:  ARLAN BIRKS  Date of Exam:   05/15/2022 Medical Rec #: [id number]     Accession #:    [id number] Date of Birth: 1941/10/18    Patient Gender: M Patient Age:   34 years Exam Location:  [facility] Procedure:      VAS [place] LOWER EXT ART SEG MULTI (SEGMENTALS & LE RAYNAUDS) Referring Phys: [place] O'NEAL --------------------------------------------------------------------------------  Indications: Claudication, and patient reports bilateral leg pain; left moreso              than right after walking a quarter of mile. Patient does not have              to stop walking due to pain. Patient also reports back pain.              Patient denies any true claudication symptoms. High Risk Factors: Hypertension, hyperlipidemia, no history of smoking, coronary                    artery disease. Other Factors: Severe aortic stenosis.  Comparison Study: NA Performing Technologist: Leavy Cella RDCS  Examination Guidelines: A complete evaluation includes at minimum, Doppler waveform signals and systolic blood pressure reading at the level of bilateral brachial, anterior tibial, and posterior tibial arteries, when vessel segments are accessible. Bilateral testing is considered an integral part of a complete examination. Photoelectric Plethysmograph (PPG) waveforms and toe systolic pressure readings are included as required and additional duplex testing as needed. Limited examinations for reoccurring indications may be performed as noted.  ABI Findings: +---------+------------------+-----+---------+--------+ Right    Rt Pressure (mmHg)IndexWaveform Comment  +---------+------------------+-----+---------+--------+ Brachial 166                                       +---------+------------------+-----+---------+--------+ CFA                             triphasic         +---------+------------------+-----+---------+--------+ Popliteal                       biphasic          +---------+------------------+-----+---------+--------+ PTA      216               1.30 biphasic          +---------+------------------+-----+---------+--------+ PERO     171  1.03 triphasic         +---------+------------------+-----+---------+--------+ DP       193               1.16 triphasic         +---------+------------------+-----+---------+--------+ Great Toe126               0.76 Normal            +---------+------------------+-----+---------+--------+ +---------+------------------+-----+---------+-------+ Left     Lt Pressure (mmHg)IndexWaveform Comment +---------+------------------+-----+---------+-------+ Brachial 147                                     +---------+------------------+-----+---------+-------+ CFA                             triphasic        +---------+------------------+-----+---------+-------+ Popliteal                       triphasic        +---------+------------------+-----+---------+-------+ PTA      195               1.17 triphasic        +---------+------------------+-----+---------+-------+ PERO     187               1.13 triphasic        +---------+------------------+-----+---------+-------+ DP       197               1.19 biphasic         +---------+------------------+-----+---------+-------+ Great Toe185               1.11 Normal           +---------+------------------+-----+---------+-------+ +-------+-----------+-----------+------------+------------+ ABI/TBIToday's ABIToday's TBIPrevious ABIPrevious TBI +-------+-----------+-----------+------------+------------+ Right  1.30       0.76                                 +-------+-----------+-----------+------------+------------+ Left   1.19       1.11                                +-------+-----------+-----------+------------+------------+   Summary: Right: Resting right ankle-brachial index is within normal range. No evidence of significant right lower extremity arterial disease. The right toe-brachial index is normal. Left: Resting left ankle-brachial index is within normal range. No evidence of significant left lower extremity arterial disease. The left toe-brachial index is normal. *See table(s) above for measurements and observations.  Electronically signed by Quay Burow MD on 05/15/2022 at 5:47:09 PM.    Final     Disposition   Pt is being discharged home today in good condition.  Follow-up Plans & Appointments    Follow-up Information     Eileen Stanford, PA-C Follow up on 06/05/2022.   Specialties: Cardiology, Radiology Why: at 1200pm. Please arrive to your appointment at 1145am. Contact information: [place] [id number] [phone]                Discharge Instructions     Call MD for:  difficulty breathing, headache or visual disturbances   Complete by: As directed    Call MD for:  extreme fatigue   Complete by: As  directed    Call MD for:  hives   Complete by: As directed    Call MD for:  persistant dizziness or light-headedness   Complete by: As directed    Call MD for:  persistant nausea and vomiting   Complete by: As directed    Call MD for:  redness, tenderness, or signs of infection (pain, swelling, redness, odor or green/yellow discharge around incision site)   Complete by: As directed    Call MD for:  severe uncontrolled pain   Complete by: As directed    Call MD for:  temperature >100.4   Complete by: As directed    Diet - low sodium heart healthy   Complete by: As directed    Discharge instructions   Complete by: As directed    ACTIVITY AND EXERCISE  Daily activity and  exercise are an important part of your recovery. People recover at different rates depending on their general health and type of valve procedure.  Most people recovering from TAVR feel better relatively quickly   No lifting, pushing, pulling more than 10 pounds (examples to avoid: groceries, vacuuming, gardening, golfing):             - For one week with a procedure through the groin.             - For six weeks for procedures through the chest wall or neck. NOTE: You will typically see one of our providers 7-14 days after your procedure to discuss [place] the above activities.      DRIVING  Do not drive until you are seen for follow up and cleared by a provider. Generally, we ask patient to not drive for 1 week after their procedure.  If you have been told by your doctor in the past that you may not drive, you must talk with him/her before you begin driving again.   DRESSING  Groin site: you may leave the clear dressing over the site for up to one week or until it falls off.   HYGIENE  If you had a femoral (leg) procedure, you may take a shower when you return home. After the shower, pat the site dry. Do NOT use powder, oils or lotions in your groin area until the site has completely healed.  If you had a chest procedure, you may shower when you return home unless specifically instructed not to by your discharging practitioner.             - DO NOT scrub incision; pat dry with a towel.             - DO NOT apply any lotions, oils, powders to the incision.             - No tub baths / swimming for at least 2 weeks.  If you notice any fevers, chills, increased pain, swelling, bleeding or pus, please contact your doctor.   ADDITIONAL INFORMATION  If you are going to have an upcoming dental procedure, please contact our office as you will require antibiotics ahead of time to prevent infection on your heart valve.    If you have any questions or concerns you can call the  structural heart phone during normal business hours 8am-4pm. If you have an urgent need after hours or weekends please call [phone] to talk to the on call provider for general cardiology. If you have an emergency that requires immediate attention, please call 911.    After TAVR  Checklist  Check  Test Description  Follow up appointment in 1-2 weeks  You will see our structural heart advanced practice provider. Your incision sites will be checked and you will be cleared to drive and resume all normal activities if you are doing well.    1 month echo and follow up  You will have an echo to check on your new heart valve and be seen back in the office by a structural heart advanced practice provider.  Follow up with your primary cardiologist You will need to be seen by your primary cardiologist in the following 3-6 months after your 1 month appointment in the valve clinic.   1 year echo and follow up You will have another echo to check on your heart valve after 1 year and be seen back in the office by a structural heart advanced practice provider. This your last structural heart visit.  Bacterial endocarditis prophylaxis  You will have to take antibiotics for the rest of your life before all dental procedures (even teeth cleanings) to protect your heart valve. Antibiotics are also required before some surgeries. Please check with your cardiologist before scheduling any surgeries. Also, please make sure to tell us if you have a penicillin allergy as you will require an alternative antibiotic.   Increase activity slowly   Complete by: As directed       Discharge Medications   Allergies as of 05/27/2022       Reactions   Penicillins Rash        Medication List     TAKE these medications    acetaminophen 500 MG tablet Commonly known as: TYLENOL Take 1,000 mg by mouth every 6 (six) hours as needed for moderate pain.   acetaminophen-codeine 300-30 MG tablet Commonly known as: TYLENOL  #3 Take 1 tablet by mouth every 6 (six) hours as needed for moderate pain.   aspirin EC 81 MG tablet Take 81 mg by mouth daily. Swallow whole.   atorvastatin 20 MG tablet Commonly known as: LIPITOR TAKE 1 TABLET (20 MG TOTAL) BY MOUTH DAILY.   ELDERBERRY PO Take 1 capsule by mouth daily.   enalapril 10 MG tablet Commonly known as: VASOTEC TAKE 1 TABLET (10 MG TOTAL) BY MOUTH DAILY.   finasteride 5 MG tablet Commonly known as: PROSCAR TAKE 1 TABLET (5 MG TOTAL) BY MOUTH DAILY.   GARCINIA CAMBOGIA-CHROMIUM PO Take 1,000 mg by mouth daily.   guaifenesin 400 MG Tabs tablet Commonly known as: HUMIBID E Take 400 mg by mouth daily.   hydrochlorothiazide 12.5 MG capsule Commonly known as: MICROZIDE TAKE 1 CAPSULE (12.5 MG TOTAL) BY MOUTH DAILY.   IMMUNE SUPPORT PO Take 1 tablet by mouth daily. NHR Immune Protect   ipratropium 0.06 % nasal spray Commonly known as: ATROVENT Place 2 sprays into both nostrils 4 (four) times daily. What changed:  when to take this reasons to take this   levothyroxine 150 MCG tablet Commonly known as: SYNTHROID TAKE 1 TABLET EVERY MORNING PRIOR TO BREAKFAST   loratadine 10 MG tablet Commonly known as: CLARITIN Take 10 mg by mouth daily.   meloxicam 15 MG tablet Commonly known as: MOBIC TAKE 1 TABLET (15 MG TOTAL) BY MOUTH DAILY.   Saw Palmetto 500 MG Caps Take 500 mg by mouth 2 (two) times daily.   SYSTANE OP Place 1 drop into both eyes daily as needed (irritation).   tamsulosin 0.4 MG Caps capsule Commonly known as: FLOMAX TAKE 1 CAPSULE (0.4 MG TOTAL) BY  MOUTH DAILY.   Urinozinc Prostate Caps Take 1 capsule by mouth daily.   vitamin B-12 500 MCG tablet Commonly known as: CYANOCOBALAMIN Take 500 mcg by mouth daily.   vitamin C 500 MG tablet Commonly known as: ASCORBIC ACID Take 500 mg by mouth daily.   Vitamin D3 125 MCG (5000 UT) Caps Take 5,000 Units by mouth daily.   Voltaren 1 % Gel Generic drug: diclofenac  Sodium Apply 2 g topically daily as needed (pain).        Outstanding Labs/Studies   None   Duration of Discharge Encounter   Greater than 30 minutes including physician time.  Signed, Kathyrn Drown, NP 05/27/2022, 11:43 AM [phone]   I have personally seen and examined this patient. I agree with the assessment and plan as outlined above.  Doing well post TAVR. Groins stable. Labs ok. BP stable. Sinus on tele.  D/c home today.  Lauree Chandler 05/27/2022 5:04 PM

## 2022-05-28 ENCOUNTER — Telehealth: Payer: Self-pay | Admitting: Cardiology

## 2022-05-28 NOTE — Telephone Encounter (Signed)
  [place] VALVE TEAM   Patient contacted regarding discharge from [facility] on 05/27/22   Patient understands to follow up with provider Nell Range, PA-C at the [place] office Patient understands discharge instructions? Yes  Patient understands medications and regimen? Yes  Patient understands to bring all medications to this visit? Yes   Kathyrn Drown NP-C Structural Heart Team  Pager: [phone]

## 2022-05-29 DIAGNOSIS — I440 Atrioventricular block, first degree: Secondary | ICD-10-CM | POA: Diagnosis not present

## 2022-05-29 DIAGNOSIS — I493 Ventricular premature depolarization: Secondary | ICD-10-CM | POA: Diagnosis not present

## 2022-05-29 DIAGNOSIS — Z952 Presence of prosthetic heart valve: Secondary | ICD-10-CM | POA: Diagnosis not present

## 2022-06-02 ENCOUNTER — Encounter (HOSPITAL_COMMUNITY): Payer: Medicare PPO

## 2022-06-05 ENCOUNTER — Ambulatory Visit: Payer: Medicare PPO | Admitting: Physician Assistant

## 2022-06-05 VITALS — BP 130/78 | HR 63 | Ht 69.0 in | Wt 248.0 lb

## 2022-06-05 DIAGNOSIS — I5032 Chronic diastolic (congestive) heart failure: Secondary | ICD-10-CM | POA: Diagnosis not present

## 2022-06-05 DIAGNOSIS — Z952 Presence of prosthetic heart valve: Secondary | ICD-10-CM | POA: Diagnosis not present

## 2022-06-05 DIAGNOSIS — I440 Atrioventricular block, first degree: Secondary | ICD-10-CM | POA: Diagnosis not present

## 2022-06-05 DIAGNOSIS — E7800 Pure hypercholesterolemia, unspecified: Secondary | ICD-10-CM | POA: Diagnosis not present

## 2022-06-05 DIAGNOSIS — I10 Essential (primary) hypertension: Secondary | ICD-10-CM

## 2022-06-05 MED ORDER — AZITHROMYCIN 500 MG PO TABS: 500.0000 mg | ORAL_TABLET | ORAL | 12 refills | Status: DC

## 2022-06-05 NOTE — Patient Instructions (Addendum)
Medication Instructions:  Start Azithromycin 500 mg, Take one tablet 1 hour before any dental work including cleanings.  *If you need a refill on your cardiac medications before your next appointment, please call your pharmacy*   Lab Work: None ordered today   If you have labs (blood work) drawn today and your tests are completely normal, you will receive your results only by: [place] (if you have MyChart) OR A paper copy in the mail If you have any lab test that is abnormal or we need to change your treatment, we will call you to review the results.   Testing/Procedures: None ordered today    Follow-Up: Follow up as scheduled    :1}    Other Instructions   Important Information About Sugar

## 2022-06-05 NOTE — Progress Notes (Signed)
HEART AND [place]                                     Cardiology Office Note:    Date:  06/05/2022   ID:  Mitchell Humphrey, DOB 09-05-1941, MRN [id number]  PCP:  Inda Coke, [facility] Cardiologist:  Evalina Field, MD / Dr, Angelena Form, MD and Dr. Cyndia Bent, MD (TAVR) [facility] Electrophysiologist:  None   Referring MD: Inda Coke, PA   [facility] s/p TAVR  History of Present Illness:    Mitchell Humphrey is a 81 y.o. 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    A 2D echocardiogram in February 2023 showed a mean gradient across the aortic valve of 26.5 mmHg with a peak gradient of 46 mmHg and a valve area of 1.21 cm.  Left ventricular ejection fraction was 60 to 65% with a stroke-volume index of 45.  The mean gradient across the aortic valve 1 year prior was 22 mmHg.  He underwent a cardiac scoring CT on 02/23/2022 which showed an aortic valve calcium score of 2428 consistent with severe aortic stenosis.  The coronary calcium score was 1599 which was 81st percentile.  The scan also showed areas of bilateral peripheral groundglass density with architectural distortion that were felt to be likely due to fibrosis from his prior COVID-19 pneumonia.  Cardiac catheterization showed mild nonobstructive coronary disease with mildly elevated LV filling pressure. He complained of 6-12 mo of progressive dyspnea and fatigue.  He was evaluated by the multidisciplinary valve team and underwent successful TAVR with a 29 mm Edwards Sapien 3 UR via the TF approach on 05/26/22. Post operative echo showed EF 60%, normally functioning TAVR with a mean gradient of 7.5 mmHg and no PVL. 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 He was discharged on a baby aspirin.   Today the patient presents to clinic for follow up. Here with  his wife. No CP or SOB. No LE edema, orthopnea or PND. Had some mild orthostatic dizziness a few days after surgery but this has resolved. No syncope. No blood in stool or urine. No palpitations.  Can tell a difference in breathing and when he walks since TAVR.    Past Medical History:  Diagnosis Date   Allergy    Arthritis    BPH (benign prostatic hyperplasia)    Cataract    DVT (deep venous thrombosis) ([place]) 12/17/2019   left gastrocnemius DVT in setting of COVID-19   Dyslipidemia    Erectile dysfunction    GERD (gastroesophageal reflux disease)    Hx of adenomatous colonic polyps    Hyperlipidemia    Hypertension    Hypogonadism male    Hypothyroid    Obesity    S/P TAVR (transcatheter aortic valve replacement) 05/26/2022   S3UR 1m vias TF approach with Dr. MAngelena Formand Dr. BCyndia Bent  Severe aortic stenosis     Past Surgical History:  Procedure Laterality Date   APPENDECTOMY     COLONOSCOPY  08/2008   Dr. MWatt Climes  HERNIA REPAIR  1970   [facility]   INTRAOPERATIVE TRANSTHORACIC ECHOCARDIOGRAM N/A 05/26/2022   Procedure: INTRAOPERATIVE TRANSTHORACIC ECHOCARDIOGRAM;  Surgeon: MBurnell Blanks MD;  Location:  [place] [facility];  Service: Open Heart Surgery;  Laterality: N/A;   RIGHT HEART CATH AND CORONARY ANGIOGRAPHY N/A 03/11/2022   Procedure: RIGHT HEART CATH AND CORONARY ANGIOGRAPHY;  Surgeon: Burnell Blanks, MD;  Location: [place] [facility];  Service: Cardiovascular;  Laterality: N/A;   TRANSCATHETER AORTIC VALVE REPLACEMENT, TRANSFEMORAL N/A 05/26/2022   Procedure: Transcatheter Aortic Valve Replacement, Transfemoral;  Surgeon: Burnell Blanks, MD;  Location: [place] [facility];  Service: Open Heart Surgery;  Laterality: N/A;    Current Medications: Current Meds  Medication Sig   acetaminophen (TYLENOL) 500 MG tablet Take 1,000 mg by mouth every 6 (six) hours as needed for moderate pain.   acetaminophen-codeine (TYLENOL #3) 300-30 MG tablet Take 1  tablet by mouth every 6 (six) hours as needed for moderate pain.   aspirin EC 81 MG tablet Take 81 mg by mouth daily. Swallow whole.   atorvastatin (LIPITOR) 20 MG tablet TAKE 1 TABLET (20 MG TOTAL) BY MOUTH DAILY.   azithromycin (ZITHROMAX) 500 MG tablet Take 1 tablet (500 mg total) by mouth as directed. Take one tablet 1 hour before any dental work including cleanings.   Cholecalciferol (VITAMIN D3) 125 MCG (5000 UT) CAPS Take 5,000 Units by mouth daily.   diclofenac Sodium (VOLTAREN) 1 % GEL Apply 2 g topically daily as needed (pain).   ELDERBERRY PO Take 1 capsule by mouth daily.   enalapril (VASOTEC) 10 MG tablet TAKE 1 TABLET (10 MG TOTAL) BY MOUTH DAILY.   finasteride (PROSCAR) 5 MG tablet TAKE 1 TABLET (5 MG TOTAL) BY MOUTH DAILY.   GARCINIA CAMBOGIA-CHROMIUM PO Take 1,000 mg by mouth daily.   hydrochlorothiazide (MICROZIDE) 12.5 MG capsule TAKE 1 CAPSULE (12.5 MG TOTAL) BY MOUTH DAILY.   ipratropium (ATROVENT) 0.06 % nasal spray Place 2 sprays into both nostrils 4 (four) times daily. (Patient taking differently: Place 2 sprays into both nostrils 4 (four) times daily as needed for rhinitis.)   levothyroxine (SYNTHROID) 150 MCG tablet TAKE 1 TABLET EVERY MORNING PRIOR TO BREAKFAST   loratadine (CLARITIN) 10 MG tablet Take 10 mg by mouth daily.   meloxicam (MOBIC) 15 MG tablet TAKE 1 TABLET (15 MG TOTAL) BY MOUTH DAILY.   Misc Natural Products (URINOZINC PROSTATE) CAPS Take 1 capsule by mouth daily.   Multiple Vitamins-Minerals (IMMUNE SUPPORT PO) Take 1 tablet by mouth daily. NHR Immune Protect   Polyethyl Glycol-Propyl Glycol (SYSTANE OP) Place 1 drop into both eyes daily as needed (irritation).   Saw Palmetto 500 MG CAPS Take 500 mg by mouth 2 (two) times daily.   tamsulosin (FLOMAX) 0.4 MG CAPS capsule TAKE 1 CAPSULE (0.4 MG TOTAL) BY MOUTH DAILY.   vitamin B-12 (CYANOCOBALAMIN) 500 MCG tablet Take 500 mcg by mouth daily.     Allergies:   Penicillins   Social History    Socioeconomic History   Marital status: Married    Spouse name: Not on file   Number of children: 3   Years of education: Not on file   Highest education level: Not on file  Occupational History   Occupation: Scientist, research (medical)   Occupation: Retired-Construction  Tobacco Use   Smoking status: Never   Smokeless tobacco: Never   Tobacco comments:    He chews on a cigar  Vaping Use   Vaping Use: Never used  Substance and Sexual Activity   Alcohol use: No   Drug use: No   Sexual activity: Not Currently  Other Topics Concern   Not on  file  Social History Narrative   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  ROS:   Please see the history of present illness.    All other systems reviewed and are negative.  EKGs/Labs/Other Studies Reviewed:    The following studies were reviewed today:  TAVR OPERATIVE NOTE     Date of Procedure:                05/26/2022   Preoperative Diagnosis:      Severe Aortic Stenosis    Postoperative Diagnosis:    Same    Procedure:        Transcatheter Aortic Valve Replacement - Percutaneous Right Transfemoral Approach             Edwards Sapien 3 Ultra Resilia THV (size 29 mm, model # 9755RSL, serial # [id number])              Co-Surgeons:                        Gaye Pollack, MD  Lauree Chandler, MD     Anesthesiologist:                  Adele Barthel, MD   Echocardiographer:              Sanda Klein, MD   Pre-operative Echo Findings: Severe aortic stenosis Normal left  ventricular systolic function   Post-operative Echo Findings: No paravalvular leak Normal left ventricular systolic function   ___________   Echo 05/27/22:  IMPRESSIONS   1. Left ventricular ejection fraction, by estimation, is 60 to 65%. The  left ventricle has normal function. The left ventricle has no regional  wall motion abnormalities. There is mild concentric left ventricular  hypertrophy. Left ventricular diastolic parameters are consistent with Grade II diastolic dysfunction  (pseudonormalization).   2. Right ventricular systolic function is normal. The right ventricular  size is normal. Tricuspid regurgitation signal is inadequate for assessing  PA pressure.   3. Left atrial size was mild to moderately dilated.   4. The mitral valve is normal in structure. Trivial mitral valve  regurgitation.   5. The aortic valve has been repaired/replaced. Aortic valve  regurgitation is not visualized. There is a 29 mm Sapien prosthetic (TAVR)  valve present in the aortic position. Echo findings are consistent with  normal structure and function of the aortic  valve prosthesis. Aortic valve area, by VTI measures 2.66 cm. Aortic  valve mean gradient measures 7.5 mmHg. Aortic valve Vmax measures 1.84  m/s. Aortic valve acceleration time measures 61 msec.   6. The inferior vena cava is dilated in size with >50% respiratory  variability, suggesting right atrial pressure of 8 mmHg.   EKG:  EKG is ordered today.  The ekg ordered today demonstrates sinus with 1st deg AV block, HR 63 PR 238m  Recent Labs: 01/07/2022: TSH 0.514 05/22/2022: ALT 25 05/27/2022: BUN 14; Creatinine, Ser 0.92; Hemoglobin 12.0; Magnesium  2.0; Platelets 200; Potassium 3.9; Sodium 137  Recent Lipid Panel    Component Value Date/Time   CHOL 135 07/14/2021 1113   TRIG 181.0 (H) 07/14/2021 1113   HDL 37.30 (L) 07/14/2021 1113   CHOLHDL 4 07/14/2021 1113   VLDL 36.2 07/14/2021 1113   LDLCALC 61 07/14/2021 1113      Risk Assessment/Calculations:       Physical Exam:    VS:  BP 130/78   Pulse 63   Ht '5\' 9"'$  (1.753 m)   Wt 248 lb (112.5 kg)   SpO2 97%   BMI 36.62 kg/m     Wt Readings from Last 3 Encounters:  06/05/22 248 lb (112.5 kg)  05/27/22 246 lb 7.6 oz (111.8 kg)  05/22/22 246 lb 3.2 oz (111.7 kg)     GEN:  Well nourished, well developed in no acute distress, obese HEENT: Normal NECK: No JVD LYMPHATICS: No lymphadenopathy CARDIAC: RRR, no murmurs, rubs, gallops RESPIRATORY:  Clear to auscultation without rales, wheezing or rhonchi  ABDOMEN: Soft, non-tender, non-distended MUSCULOSKELETAL:  No edema; No deformity  SKIN: Warm and dry.  Groin sites clear without hematoma or ecchymosis  NEUROLOGIC:  Alert and oriented x 3 PSYCHIATRIC:  Normal affect   ASSESSMENT:    1. S/P TAVR (transcatheter aortic valve replacement)   2. Primary hypertension   3. 1st degree AV block   4. Hypercholesterolemia   5. Chronic diastolic CHF (congestive heart failure) (HCC)    PLAN:    In order of problems listed above:  Severe AS s/p TAVR: doing well 1 week out from TAVR. ECG with no HAVB. Groin sites are healing well. SBE prophylaxis discussed; I have RX'd azithromycin due to a PCN allergy. I will see him back for 1 month follow up and echo in August.    HTN: BP well controlled today. No changes made.   1st deg AV block: ECG stable today. No high risk alerts on Zio   HLD: continue Lipitor    Chronic diastolic CHF: appears euvolemic today on exam. Continue HCTZ     Cardiac Rehabilitation Eligibility Assessment           Medication Adjustments/Labs and Tests Ordered: Current medicines are reviewed at length with the patient today.  Concerns regarding medicines are outlined above.  Orders Placed This Encounter  Procedures   EKG 12-Lead   Meds ordered this encounter  Medications   azithromycin (ZITHROMAX) 500 MG tablet    Sig: Take 1 tablet (500 mg total) by mouth as  directed. Take one tablet 1 hour before any dental work including cleanings.    Dispense:  6 tablet    Refill:  12    Order Specific Question:   Supervising Provider    Answer:   Sherren Mocha [[id number]]    Patient Instructions  Medication Instructions:  Start Azithromycin 500 mg, Take one tablet 1 hour before any dental work including cleanings.  *If you need a refill on your cardiac medications before your next appointment, please call your pharmacy*   Lab Work: None ordered today   If you have labs (blood work) drawn today and your tests are completely normal, you will receive your results only by: [place] (if you have MyChart) OR A paper copy in the mail If you have any lab test that is abnormal or we need to change your treatment, we will call you to review the results.   Testing/Procedures: None ordered today    Follow-Up: Follow up as  scheduled    :1}    Other Instructions   Important Information About Sugar        Signed, Angelena Form, PA-C  06/05/2022 12:25 PM    [facility]

## 2022-06-23 NOTE — Progress Notes (Unsigned)
HEART AND [place]                                     Cardiology Office Note:    Date:  06/23/2022   ID:  Mitchell Humphrey, DOB 1941-08-21, MRN [id number]  PCP:  Inda Coke, [facility] Cardiologist:  Evalina Field, MD / Dr, Angelena Form, MD and Dr. Cyndia Bent, MD (TAVR) [facility] Electrophysiologist:  None   Referring MD: Inda Coke, PA   1 month s/p TAVR  History of Present Illness:    Mitchell Humphrey is a 81 y.o. 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   A 2D echocardiogram in February 2023 showed a mean gradient across the aortic valve of 26.5 mmHg with a peak gradient of 46 mmHg and a valve area of 1.21 cm.  Left ventricular ejection fraction was 60 to 65% with a stroke-volume index of 45.  He underwent a cardiac scoring CT on 02/23/2022 which showed an aortic valve calcium score of 2428 consistent with severe aortic stenosis.  The coronary calcium score was 1599 which was 81st percentile.  The scan also showed areas of bilateral peripheral groundglass density with architectural distortion that were felt to be likely due to fibrosis from his prior COVID-19 pneumonia.  Cardiac catheterization showed mild nonobstructive coronary disease with mildly elevated LV filling pressure. He complained of 6-12 mo of progressive dyspnea and fatigue.  He was evaluated by the multidisciplinary valve team and underwent successful TAVR with a 29 mm Edwards Sapien 3 UR via the TF approach on 05/26/22. Post operative echo showed EF 60%, normally functioning TAVR with a mean gradient of 7.5 mmHg and no PVL. 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 He was discharged on a baby aspirin.   Zio AT did not show any HAVB or significant arrhythmias..   Today the patient presents to clinic for follow up.   Past  Medical History:  Diagnosis Date   Allergy    Arthritis    BPH (benign prostatic hyperplasia)    Cataract    DVT (deep venous thrombosis) ([place]) 12/17/2019   left gastrocnemius DVT in setting of COVID-19   Dyslipidemia    Erectile dysfunction    GERD (gastroesophageal reflux disease)    Hx of adenomatous colonic polyps    Hyperlipidemia    Hypertension    Hypogonadism male    Hypothyroid    Obesity    S/P TAVR (transcatheter aortic valve replacement) 05/26/2022   S3UR 18m vias TF approach with Dr. MAngelena Formand Dr. BCyndia Bent  Severe aortic stenosis     Past Surgical History:  Procedure Laterality Date   APPENDECTOMY     COLONOSCOPY  08/2008   Dr. MWatt Climes  HERNIA REPAIR  1970   [place]   INTRAOPERATIVE TRANSTHORACIC ECHOCARDIOGRAM N/A 05/26/2022   Procedure: INTRAOPERATIVE TRANSTHORACIC ECHOCARDIOGRAM;  Surgeon: MBurnell Blanks MD;  [facility];  Service: Open Heart Surgery;  Laterality: N/A;   RIGHT HEART CATH AND CORONARY ANGIOGRAPHY N/A 03/11/2022   Procedure: RIGHT HEART CATH AND CORONARY ANGIOGRAPHY;  Surgeon: MBurnell Blanks MD;  [facility];  Service: Cardiovascular;  Laterality: N/A;   TRANSCATHETER AORTIC VALVE  REPLACEMENT, TRANSFEMORAL N/A 05/26/2022   Procedure: Transcatheter Aortic Valve Replacement, Transfemoral;  Surgeon: Burnell Blanks, MD;  Location: [place] [facility];  Service: Open Heart Surgery;  Laterality: N/A;    Current Medications: No outpatient medications have been marked as taking for the 06/24/22 encounter (Appointment) with [facility].     Allergies:   Penicillins   Social History   Socioeconomic History   Marital status: Married    Spouse name: Not on file   Number of children: 3   Years of education: Not on file   Highest education level: Not on file  Occupational History   Occupation: Scientist, research (medical)   Occupation: Retired-Construction  Tobacco Use    Smoking status: Never   Smokeless tobacco: Never   Tobacco comments:    He chews on a cigar  Vaping Use   Vaping Use: Never used  Substance and Sexual Activity   Alcohol use: No   Drug use: No   Sexual activity: Not Currently  Other Topics Concern   Not on file  Social History Narrative   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  ROS:   Please see the history of present illness.    All other systems reviewed and are negative.  EKGs/Labs/Other Studies Reviewed:    The following studies were reviewed today:  TAVR OPERATIVE NOTE     Date of Procedure:                05/26/2022   Preoperative Diagnosis:      Severe Aortic Stenosis    Postoperative Diagnosis:    Same    Procedure:        Transcatheter Aortic Valve Replacement - Percutaneous Right Transfemoral Approach             Edwards Sapien 3 Ultra Resilia THV (size 29 mm, model # 9755RSL, serial # [id number])              Co-Surgeons:                        Gaye Pollack, MD  Lauree Chandler, MD     Anesthesiologist:                  Adele Barthel, MD   Echocardiographer:              Sanda Klein, MD   Pre-operative Echo Findings: Severe aortic stenosis Normal left ventricular systolic function   Post-operative Echo Findings: No paravalvular leak Normal left ventricular systolic function   ___________   Echo 05/27/22:  IMPRESSIONS   1. Left ventricular ejection fraction, by estimation, is 60 to 65%. The  left ventricle has normal function. The left  ventricle has no regional  wall motion abnormalities. There is mild concentric left ventricular  hypertrophy. Left ventricular diastolic parameters are consistent with Grade II diastolic dysfunction  (pseudonormalization).   2. Right ventricular systolic function is normal. The right ventricular  size is normal. Tricuspid regurgitation signal is inadequate for assessing  PA pressure.   3. Left atrial size was mild to moderately dilated.   4. The mitral valve is normal in structure. Trivial mitral valve  regurgitation.   5. The aortic valve has been repaired/replaced. Aortic valve  regurgitation is not visualized. There is a 29 mm Sapien prosthetic (TAVR)  valve present in the aortic position. Echo findings are consistent with  normal structure and function of the aortic  valve prosthesis. Aortic valve area, by VTI measures 2.66 cm. Aortic  valve mean gradient measures 7.5 mmHg. Aortic valve Vmax measures 1.84  m/s. Aortic valve acceleration time measures 61 msec.   6. The inferior vena cava is dilated in size with >50% respiratory  variability, suggesting right atrial pressure of 8 mmHg.   EKG:  EKG is NOT ordered today.    Recent Labs: 01/07/2022: TSH 0.514 05/22/2022: ALT 25 05/27/2022: BUN 14; Creatinine, Ser 0.92; Hemoglobin 12.0; Magnesium 2.0; Platelets 200; Potassium 3.9; Sodium 137  Recent Lipid Panel    Component Value Date/Time   CHOL 135 07/14/2021 1113   TRIG 181.0 (H) 07/14/2021 1113   HDL 37.30 (L) 07/14/2021 1113   CHOLHDL 4 07/14/2021 1113   VLDL 36.2 07/14/2021 1113   LDLCALC 61 07/14/2021 1113     Risk Assessment/Calculations:       Physical Exam:    VS:  There were no vitals taken for this visit.    Wt Readings from Last 3 Encounters:  06/05/22 248 lb (112.5 kg)  05/27/22 246 lb 7.6 oz (111.8 kg)  05/22/22 246 lb 3.2 oz (111.7 kg)     GEN:  Well nourished, well developed in no acute distress, obese HEENT: Normal NECK: No JVD LYMPHATICS: No  lymphadenopathy CARDIAC: RRR, no murmurs, rubs, gallops RESPIRATORY:  Clear to auscultation without rales, wheezing or rhonchi  ABDOMEN: Soft, non-tender, non-distended MUSCULOSKELETAL:  No edema; No deformity  SKIN: Warm and dry.   NEUROLOGIC:  Alert and oriented x 3 PSYCHIATRIC:  Normal affect   ASSESSMENT:    1. S/P TAVR (transcatheter aortic valve replacement)   2. Primary hypertension   3. 1st degree AV block   4. Hypercholesterolemia   5. Chronic diastolic CHF (congestive heart failure) (HCC)     PLAN:    In order of problems listed above:  Severe AS s/p TAVR:    Continue aspirin alone. SBE prophylaxis discussed; I have RX'd azithromycin due to a PCN allergy. I will see him back for 1 year follow up and echo    HTN: BP well controlled today. No changes made.   1st deg AV block: ECG stable today. No high risk findings on Zio   HLD: continue Lipitor    Chronic diastolic CHF: appears euvolemic today on exam. Continue HCTZ   {The patient has an active order for outpatient cardiac rehabilitation.   Please indicate if the patient is ready to start. Do NOT delete this.  It will auto delete.  Refresh note, then sign.              Click here to document readiness and see contraindications.  :1}  Cardiac Rehabilitation Eligibility Assessment           Medication Adjustments/Labs and Tests Ordered: Current medicines are reviewed at length with the patient today.  Concerns regarding medicines are outlined above.  No orders of the defined types were placed in this encounter.  No orders of the defined types were placed  in this encounter.   There are no Patient Instructions on file for this visit.   Signed, Angelena Form, PA-C  06/23/2022 3:17 PM    [facility]

## 2022-06-24 ENCOUNTER — Ambulatory Visit: Payer: Medicare PPO | Admitting: Physician Assistant

## 2022-06-24 ENCOUNTER — Ambulatory Visit (HOSPITAL_COMMUNITY): Payer: Medicare PPO | Attending: Cardiology

## 2022-06-24 VITALS — BP 120/70 | HR 70 | Ht 69.0 in | Wt 244.0 lb

## 2022-06-24 DIAGNOSIS — I440 Atrioventricular block, first degree: Secondary | ICD-10-CM | POA: Diagnosis not present

## 2022-06-24 DIAGNOSIS — Z952 Presence of prosthetic heart valve: Secondary | ICD-10-CM

## 2022-06-24 DIAGNOSIS — I5032 Chronic diastolic (congestive) heart failure: Secondary | ICD-10-CM | POA: Diagnosis not present

## 2022-06-24 DIAGNOSIS — I350 Nonrheumatic aortic (valve) stenosis: Secondary | ICD-10-CM | POA: Insufficient documentation

## 2022-06-24 DIAGNOSIS — E7800 Pure hypercholesterolemia, unspecified: Secondary | ICD-10-CM

## 2022-06-24 DIAGNOSIS — I10 Essential (primary) hypertension: Secondary | ICD-10-CM

## 2022-06-24 LAB — ECHOCARDIOGRAM COMPLETE
AR max vel: 2.05 cm2
AV Area VTI: 2.12 cm2
AV Area mean vel: 2.05 cm2
AV Mean grad: 6.7 mmHg
AV Peak grad: 12.3 mmHg
Ao pk vel: 1.75 m/s
Area-P 1/2: 3.77 cm2
S' Lateral: 3 cm

## 2022-06-24 NOTE — Patient Instructions (Signed)
Medication Instructions:  No changes *If you need a refill on your cardiac medications before your next appointment, please call your pharmacy*   Lab Work: none If you have labs (blood work) drawn today and your tests are completely normal, you will receive your results only by: [place] (if you have MyChart) OR A paper copy in the mail If you have any lab test that is abnormal or we need to change your treatment, we will call you to review the results.   Follow up Echo and follow up in one year - see appointments below

## 2022-07-06 ENCOUNTER — Ambulatory Visit (INDEPENDENT_AMBULATORY_CARE_PROVIDER_SITE_OTHER): Payer: Medicare PPO | Admitting: Physician Assistant

## 2022-07-06 ENCOUNTER — Encounter: Payer: Self-pay | Admitting: Physician Assistant

## 2022-07-06 VITALS — BP 140/80 | HR 75 | Temp 98.4°F | Ht 69.0 in | Wt 243.4 lb

## 2022-07-06 DIAGNOSIS — R81 Glycosuria: Secondary | ICD-10-CM | POA: Diagnosis not present

## 2022-07-06 DIAGNOSIS — R0981 Nasal congestion: Secondary | ICD-10-CM | POA: Diagnosis not present

## 2022-07-06 DIAGNOSIS — R350 Frequency of micturition: Secondary | ICD-10-CM

## 2022-07-06 LAB — POCT URINALYSIS DIPSTICK
Bilirubin, UA: 1
Blood, UA: NEGATIVE
Glucose, UA: POSITIVE — AB
Ketones, UA: NEGATIVE
Nitrite, UA: NEGATIVE
Protein, UA: POSITIVE — AB
Spec Grav, UA: >=1.03 — AB (ref 1.010–1.025)
Urobilinogen, UA: 0.2 E.U./dL
pH, UA: 6 (ref 5.0–8.0)

## 2022-07-06 LAB — POCT GLYCOSYLATED HEMOGLOBIN (HGB A1C): Hemoglobin A1C: 5.9 % — AB (ref 4.0–5.6)

## 2022-07-06 MED ORDER — LEVOFLOXACIN 500 MG PO TABS: 500.0000 mg | ORAL_TABLET | Freq: Every day | ORAL | 0 refills | Status: AC

## 2022-07-06 NOTE — Progress Notes (Signed)
Mitchell Humphrey is a 81 y.o. male here for a follow up of a pre-existing problem.  History of Present Illness:   Chief Complaint  Patient presents with   Urinary Frequency    Pt c/o frequency during the day and up 3-4 x's at night for the past 1-2 months. Denies dysuria. Has some back pain now, but thinks he pulled a muscle.   Sinus Problem    Pt c/o sinus  drainage, head congestion for several weeks. Also c/o ear pain bilateral.    HPI  Urinary frequency Having to go to the bathroom more frequently - at least every 2-3 hours and this is the same at night. Denies pain. Has had prostatitis in the past and feels like he is developing symptoms of this. Denies: fever, chills, n/v/d, pelvic pain/pressure. Has seen urology in the past but not since 2014. Currently taking finasteride 5 mg daily and flomax 0.4 mg daily.  Sinus infection  When blowing nose has had increased mucus. Denies fevers, chills, n/v/d. Denies sick contacts. Has not covid tested. Having increased pressure around nose.  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 MAngelena Formand Dr. BCyndia Bent  Severe aortic stenosis      Social History   Tobacco Use   Smoking status: Never   Smokeless tobacco: Never   Tobacco comments:    He chews on a cigar  Vaping Use   Vaping Use: Never used  Substance Use Topics   Alcohol use: No   Drug use: No    Past Surgical History:  Procedure Laterality Date   APPENDECTOMY     COLONOSCOPY  08/2008   Dr. MWatt Climes  HERNIA  REPAIR  1970   [facility]   INTRAOPERATIVE TRANSTHORACIC ECHOCARDIOGRAM N/A 05/26/2022   Procedure: INTRAOPERATIVE TRANSTHORACIC ECHOCARDIOGRAM;  Surgeon: MBurnell Blanks MD;  [facility];  Service: Open Heart Surgery;  Laterality: N/A;   RIGHT HEART CATH AND CORONARY ANGIOGRAPHY N/A 03/11/2022   Procedure: RIGHT HEART CATH AND CORONARY ANGIOGRAPHY;  Surgeon: MBurnell Blanks MD;  [facility];  Service: Cardiovascular;  Laterality: N/A;   TRANSCATHETER AORTIC VALVE REPLACEMENT, TRANSFEMORAL N/A 05/26/2022   Procedure: Transcatheter Aortic Valve Replacement, Transfemoral;  Surgeon: MBurnell Blanks MD;  [facility];  Service: Open Heart Surgery;  Laterality: N/A;    Family History  Problem Relation Age of Onset   Breast cancer Mother    Lung cancer Father    Brain cancer Father    Breast cancer Sister    Heart attack Sister     Allergies  Allergen Reactions   Penicillins Rash    Current Medications:   Current Outpatient Medications:    acetaminophen (TYLENOL) 500 MG tablet, Take 1,000 mg by mouth every 6 (six) hours as needed for moderate pain., Disp: , Rfl:    acetaminophen-codeine (TYLENOL #3) 300-30 MG tablet, Take 1 tablet by  mouth every 6 (six) hours as needed for moderate pain., Disp: 30 tablet, Rfl: 0   aspirin EC 81 MG tablet, Take 81 mg by mouth daily. Swallow whole., Disp: , Rfl:    atorvastatin (LIPITOR) 20 MG tablet, TAKE 1 TABLET (20 MG TOTAL) BY MOUTH DAILY., Disp: 90 tablet, Rfl: 1   Cholecalciferol (VITAMIN D3) 125 MCG (5000 UT) CAPS, Take 5,000 Units by mouth daily., Disp: , Rfl:    diclofenac Sodium (VOLTAREN) 1 % GEL, Apply 2 g topically daily as needed (pain)., Disp: , Rfl:    ELDERBERRY PO, Take 1 capsule by mouth daily., Disp: , Rfl:    enalapril (VASOTEC) 10 MG tablet, TAKE 1 TABLET (10 MG TOTAL) BY MOUTH DAILY., Disp: 90 tablet, Rfl: 1   finasteride (PROSCAR) 5 MG tablet, TAKE 1 TABLET (5  MG TOTAL) BY MOUTH DAILY., Disp: 90 tablet, Rfl: 1   guaifenesin (HUMIBID E) 400 MG TABS tablet, Take 400 mg by mouth daily., Disp: , Rfl:    hydrochlorothiazide (MICROZIDE) 12.5 MG capsule, TAKE 1 CAPSULE (12.5 MG TOTAL) BY MOUTH DAILY., Disp: 90 capsule, Rfl: 1   ipratropium (ATROVENT) 0.06 % nasal spray, Place 2 sprays into both nostrils 4 (four) times daily., Disp: 15 mL, Rfl: 12   levothyroxine (SYNTHROID) 150 MCG tablet, TAKE 1 TABLET EVERY MORNING PRIOR TO BREAKFAST, Disp: 90 tablet, Rfl: 1   loratadine (CLARITIN) 10 MG tablet, Take 10 mg by mouth daily., Disp: , Rfl:    meloxicam (MOBIC) 15 MG tablet, TAKE 1 TABLET (15 MG TOTAL) BY MOUTH DAILY., Disp: 90 tablet, Rfl: 1   Misc Natural Products (URINOZINC PROSTATE) CAPS, Take 1 capsule by mouth daily., Disp: , Rfl:    Multiple Vitamins-Minerals (IMMUNE SUPPORT PO), Take 1 tablet by mouth daily. NHR Immune Protect, Disp: , Rfl:    Polyethyl Glycol-Propyl Glycol (SYSTANE OP), Place 1 drop into both eyes daily as needed (irritation)., Disp: , Rfl:    tamsulosin (FLOMAX) 0.4 MG CAPS capsule, TAKE 1 CAPSULE (0.4 MG TOTAL) BY MOUTH DAILY., Disp: 90 capsule, Rfl: 1   azithromycin (ZITHROMAX) 500 MG tablet, Take 1 tablet (500 mg total) by mouth as directed. Take one tablet 1 hour before any dental work including cleanings. (Patient not taking: Reported on 07/06/2022), Disp: 6 tablet, Rfl: 12   Review of Systems:   ROS Negative unless otherwise specified per HPI.  Vitals:   Vitals:   07/06/22 0755  BP: (!) 140/80  Pulse: 75  Temp: 98.4 F (36.9 C)  TempSrc: Temporal  SpO2: 95%  Weight: 243 lb 6.1 oz (110.4 kg)  Height: '5\' 9"'$  (1.753 m)     Body mass index is 35.94 kg/m.  Physical Exam:   Physical Exam Vitals and nursing note reviewed.  Constitutional:      General: He is not in acute distress.    Appearance: He is well-developed. He is not ill-appearing or toxic-appearing.  Cardiovascular:     Rate and Rhythm: Normal rate and  regular rhythm.     Pulses: Normal pulses.     Heart sounds: Normal heart sounds, S1 normal and S2 normal.  Pulmonary:     Effort: Pulmonary effort is normal.     Breath sounds: Normal breath sounds.  Abdominal:     Tenderness: There is no right CVA tenderness or left CVA tenderness.  Skin:    General: Skin is warm and dry.  Neurological:     Mental Status: He is alert.     GCS: GCS eye  subscore is 4. GCS verbal subscore is 5. GCS motor subscore is 6.  Psychiatric:        Speech: Speech normal.        Behavior: Behavior normal. Behavior is cooperative.    Results for orders placed or performed in visit on 07/06/22  POCT urinalysis dipstick  Result Value Ref Range   Color, UA amber    Clarity, UA clear    Glucose, UA Positive (A) Negative   Bilirubin, UA 1    Ketones, UA Negative    Spec Grav, UA >=1.030 (A) 1.010 - 1.025   Blood, UA Negative    pH, UA 6.0 5.0 - 8.0   Protein, UA Positive (A) Negative   Urobilinogen, UA 0.2 0.2 or 1.0 E.U./dL   Nitrite, UA Negative    Leukocytes, UA Small (1+) (A) Negative   Appearance     Odor    POCT HgB A1C  Result Value Ref Range   Hemoglobin A1C 5.9 (A) 4.0 - 5.6 %    Assessment and Plan:   Frequency of urination No red flags Symptoms concerning for enlarged prostate Due to prior hx of prostatitis and similar symptoms, will empirically treat with levaquin 500 mg daily Push fluids Continue flomax and finasteride Urology referral placed If new/worsening symptoms, needs to be seen in office  Glucosuria A1c shows pre-diabetic level Continue to work on sweets reduction Follow-up prn  Sinus congestion Early sinusitis No red flags We are starting levaquin for urinary infection, this will cover for sinus infection as well Follow- prn   Inda Coke, PA-C

## 2022-07-06 NOTE — Patient Instructions (Signed)
It was great to see you!  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  Start levaquin antibiotic -- you will take this for 4 weeks.  I will place referral for the urologist  If you have new/worsening symptoms, let me know.  Take care,  Inda Coke PA-C

## 2022-07-07 DIAGNOSIS — H43813 Vitreous degeneration, bilateral: Secondary | ICD-10-CM | POA: Diagnosis not present

## 2022-07-07 DIAGNOSIS — H26493 Other secondary cataract, bilateral: Secondary | ICD-10-CM | POA: Diagnosis not present

## 2022-07-07 DIAGNOSIS — Z961 Presence of intraocular lens: Secondary | ICD-10-CM | POA: Diagnosis not present

## 2022-07-07 LAB — URINE CULTURE
MICRO NUMBER:: 13807700
SPECIMEN QUALITY:: ADEQUATE

## 2022-07-08 ENCOUNTER — Telehealth (HOSPITAL_COMMUNITY): Payer: Self-pay

## 2022-07-08 NOTE — Telephone Encounter (Signed)
Per pt wife, pt is not interested in the cardiac rehab program. Closed referral.

## 2022-08-05 DIAGNOSIS — H43813 Vitreous degeneration, bilateral: Secondary | ICD-10-CM | POA: Diagnosis not present

## 2022-08-05 DIAGNOSIS — H11152 Pinguecula, left eye: Secondary | ICD-10-CM | POA: Diagnosis not present

## 2022-08-05 DIAGNOSIS — H26491 Other secondary cataract, right eye: Secondary | ICD-10-CM | POA: Diagnosis not present

## 2022-08-05 DIAGNOSIS — H11001 Unspecified pterygium of right eye: Secondary | ICD-10-CM | POA: Diagnosis not present

## 2022-08-05 DIAGNOSIS — H04123 Dry eye syndrome of bilateral lacrimal glands: Secondary | ICD-10-CM | POA: Diagnosis not present

## 2022-08-05 DIAGNOSIS — H35371 Puckering of macula, right eye: Secondary | ICD-10-CM | POA: Diagnosis not present

## 2022-08-05 DIAGNOSIS — H43393 Other vitreous opacities, bilateral: Secondary | ICD-10-CM | POA: Diagnosis not present

## 2022-08-14 ENCOUNTER — Encounter: Payer: Self-pay | Admitting: Physician Assistant

## 2022-08-14 ENCOUNTER — Ambulatory Visit (INDEPENDENT_AMBULATORY_CARE_PROVIDER_SITE_OTHER): Payer: Medicare PPO | Admitting: Physician Assistant

## 2022-08-14 VITALS — BP 130/76 | HR 67 | Temp 97.8°F | Ht 69.0 in | Wt 246.0 lb

## 2022-08-14 DIAGNOSIS — R0981 Nasal congestion: Secondary | ICD-10-CM | POA: Diagnosis not present

## 2022-08-14 DIAGNOSIS — Z23 Encounter for immunization: Secondary | ICD-10-CM

## 2022-08-14 MED ORDER — DOXYCYCLINE HYCLATE 100 MG PO TABS: 100.0000 mg | ORAL_TABLET | Freq: Two times a day (BID) | ORAL | 0 refills | Status: DC

## 2022-08-14 NOTE — Patient Instructions (Addendum)
It was great to see you!  Change to zyrtec (generic is fine)  Continue the nasal spray  Start doxycyline  If symptoms persist, we will get CT scan of your sinuses  Take care,  Inda Coke PA-C

## 2022-08-14 NOTE — Progress Notes (Signed)
Mitchell Humphrey is a 81 y.o. male here for a follow up of a pre-existing problem.  History of Present Illness:   Chief Complaint  Patient presents with   Sinus Problem    Pt c/o sinus congestion again finished medication and came back again. C/o runny nose, head congestion, balance is off.    Sinus Problem    Sinusitis Was last seen on 07/06/22 for sinusitis. He was also having prostate symptoms and was treated with levaquin for 28 days.  Symptoms improved for a bit, but then returned. Denies: fevers, chills, neck stiffness, severe cough.  He is using atrovent nasal spray regularly and taking without any issues. Has been taking claritin 10 mg daily for years.  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 MAngelena Formand Dr. BCyndia Bent  Severe aortic stenosis      Social History   Tobacco Use   Smoking status: Never   Smokeless tobacco: Never   Tobacco comments:    He chews on a cigar  Vaping Use   Vaping Use: Never used  Substance Use Topics   Alcohol use: No   Drug use: No    Past Surgical History:  Procedure Laterality Date   APPENDECTOMY     COLONOSCOPY  08/2008   Dr. MWatt Climes  HERNIA REPAIR  1970   [facility]   INTRAOPERATIVE TRANSTHORACIC ECHOCARDIOGRAM N/A 05/26/2022   Procedure: INTRAOPERATIVE TRANSTHORACIC ECHOCARDIOGRAM;  Surgeon: MBurnell Blanks MD;  [facility];  Service: Open Heart Surgery;  Laterality: N/A;   RIGHT HEART CATH AND CORONARY ANGIOGRAPHY N/A 03/11/2022    Procedure: RIGHT HEART CATH AND CORONARY ANGIOGRAPHY;  Surgeon: MBurnell Blanks MD;  [facility];  Service: Cardiovascular;  Laterality: N/A;   TRANSCATHETER AORTIC VALVE REPLACEMENT, TRANSFEMORAL N/A 05/26/2022   Procedure: Transcatheter Aortic Valve Replacement, Transfemoral;  Surgeon: MBurnell Blanks MD;  [facility];  Service: Open Heart Surgery;  Laterality: N/A;    Family History  Problem Relation Age of Onset   Breast cancer Mother    Lung cancer Father    Brain cancer Father    Breast cancer Sister    Heart attack Sister     Allergies  Allergen Reactions   Penicillins Rash    Current Medications:   Current Outpatient Medications:    acetaminophen (TYLENOL) 500 MG tablet, Take 1,000 mg by mouth every 6 (six) hours as needed for moderate pain., Disp: , Rfl:    acetaminophen-codeine (TYLENOL #3) 300-30 MG tablet, Take 1 tablet by mouth every 6 (six) hours as needed for moderate pain., Disp: 30 tablet, Rfl: 0   aspirin EC 81 MG tablet, Take 81 mg by mouth daily. Swallow whole., Disp: , Rfl:    atorvastatin (LIPITOR) 20 MG tablet, TAKE 1 TABLET (20 MG TOTAL) BY MOUTH DAILY., Disp: 90 tablet, Rfl: 1   Cholecalciferol (VITAMIN D3) 125 MCG (5000 UT)  CAPS, Take 5,000 Units by mouth daily., Disp: , Rfl:    diclofenac Sodium (VOLTAREN) 1 % GEL, Apply 2 g topically daily as needed (pain)., Disp: , Rfl:    ELDERBERRY PO, Take 1 capsule by mouth daily., Disp: , Rfl:    enalapril (VASOTEC) 10 MG tablet, TAKE 1 TABLET (10 MG TOTAL) BY MOUTH DAILY., Disp: 90 tablet, Rfl: 1   finasteride (PROSCAR) 5 MG tablet, TAKE 1 TABLET (5 MG TOTAL) BY MOUTH DAILY., Disp: 90 tablet, Rfl: 1   guaifenesin (HUMIBID E) 400 MG TABS tablet, Take 400 mg by mouth daily., Disp: , Rfl:    hydrochlorothiazide (MICROZIDE) 12.5 MG capsule, TAKE 1 CAPSULE (12.5 MG TOTAL) BY MOUTH DAILY., Disp: 90 capsule, Rfl: 1   ipratropium (ATROVENT) 0.06 % nasal spray, Place 2  sprays into both nostrils 4 (four) times daily., Disp: 15 mL, Rfl: 12   ketorolac (ACULAR) 0.5 % ophthalmic solution, SMARTSIG:1 Drop(s) In Eye(s) Daily PRN, Disp: , Rfl:    levothyroxine (SYNTHROID) 150 MCG tablet, TAKE 1 TABLET EVERY MORNING PRIOR TO BREAKFAST, Disp: 90 tablet, Rfl: 1   loratadine (CLARITIN) 10 MG tablet, Take 10 mg by mouth daily., Disp: , Rfl:    meloxicam (MOBIC) 15 MG tablet, TAKE 1 TABLET (15 MG TOTAL) BY MOUTH DAILY., Disp: 90 tablet, Rfl: 1   Misc Natural Products (URINOZINC PROSTATE) CAPS, Take 1 capsule by mouth daily., Disp: , Rfl:    Multiple Vitamins-Minerals (IMMUNE SUPPORT PO), Take 1 tablet by mouth daily. NHR Immune Protect, Disp: , Rfl:    Polyethyl Glycol-Propyl Glycol (SYSTANE OP), Place 1 drop into both eyes daily as needed (irritation)., Disp: , Rfl:    tamsulosin (FLOMAX) 0.4 MG CAPS capsule, TAKE 1 CAPSULE (0.4 MG TOTAL) BY MOUTH DAILY., Disp: 90 capsule, Rfl: 1   Review of Systems:   ROS Negative unless otherwise specified per HPI.  Vitals:   Vitals:   08/14/22 0834  BP: 130/76  Pulse: 67  Temp: 97.8 F (36.6 C)  TempSrc: Temporal  SpO2: 96%  Weight: 246 lb (111.6 kg)  Height: '5\' 9"'$  (1.753 m)     Body mass index is 36.33 kg/m.  Physical Exam:   Physical Exam Vitals and nursing note reviewed.  Constitutional:      General: He is not in acute distress.    Appearance: He is well-developed. He is not ill-appearing or toxic-appearing.  HENT:     Head: Normocephalic and atraumatic.     Right Ear: Tympanic membrane, ear canal and external ear normal. Tympanic membrane is not erythematous, retracted or bulging.     Left Ear: Tympanic membrane, ear canal and external ear normal. Tympanic membrane is not erythematous, retracted or bulging.     Nose:     Right Sinus: Frontal sinus tenderness present. No maxillary sinus tenderness.     Left Sinus: Frontal sinus tenderness present. No maxillary sinus tenderness.     Mouth/Throat:      Pharynx: Uvula midline. No posterior oropharyngeal erythema.  Eyes:     General: Lids are normal.     Conjunctiva/sclera: Conjunctivae normal.  Neck:     Trachea: Trachea normal.  Cardiovascular:     Rate and Rhythm: Normal rate and regular rhythm.     Pulses: Normal pulses.     Heart sounds: Normal heart sounds, S1 normal and S2 normal.  Pulmonary:     Effort: Pulmonary effort is normal.     Breath sounds: Normal breath sounds. No decreased breath sounds, wheezing,  rhonchi or rales.  Lymphadenopathy:     Cervical: No cervical adenopathy.  Skin:    General: Skin is warm and dry.  Neurological:     Mental Status: He is alert.     GCS: GCS eye subscore is 4. GCS verbal subscore is 5. GCS motor subscore is 6.  Psychiatric:        Speech: Speech normal.        Behavior: Behavior normal. Behavior is cooperative.     Assessment and Plan:   Sinus congestion Recurrent Start doxycycline If lack of improvement or ongoing sx, will obtain CT scan of sinuses  Continue nasal spray Consider changing allergy pill to zyrtec No red flags   Inda Coke, PA-C

## 2022-08-17 ENCOUNTER — Ambulatory Visit: Payer: Medicare PPO

## 2022-08-20 DIAGNOSIS — N401 Enlarged prostate with lower urinary tract symptoms: Secondary | ICD-10-CM | POA: Diagnosis not present

## 2022-08-20 DIAGNOSIS — R3915 Urgency of urination: Secondary | ICD-10-CM | POA: Diagnosis not present

## 2022-09-28 ENCOUNTER — Encounter: Payer: Self-pay | Admitting: Nurse Practitioner

## 2022-09-28 ENCOUNTER — Ambulatory Visit: Payer: Medicare PPO | Admitting: Nurse Practitioner

## 2022-09-28 VITALS — BP 128/80 | HR 70 | Ht 69.0 in | Wt 247.8 lb

## 2022-09-28 DIAGNOSIS — U099 Post covid-19 condition, unspecified: Secondary | ICD-10-CM

## 2022-09-28 DIAGNOSIS — J302 Other seasonal allergic rhinitis: Secondary | ICD-10-CM | POA: Diagnosis not present

## 2022-09-28 DIAGNOSIS — Z952 Presence of prosthetic heart valve: Secondary | ICD-10-CM

## 2022-09-28 DIAGNOSIS — J309 Allergic rhinitis, unspecified: Secondary | ICD-10-CM | POA: Insufficient documentation

## 2022-09-28 NOTE — Assessment & Plan Note (Signed)
Recovered well. Valve in good working condition on echocardiogram from August 2023. Follow up with cardiology as scheduled.

## 2022-09-28 NOTE — Progress Notes (Signed)
$'@Patient'S$  ID: Mitchell Humphrey, male    DOB: 30-Oct-1941, 81 y.o.   MRN: [id number]  Chief Complaint  Patient presents with   Follow-up    Pt f/u to discuss PFT results from June. He reports he is feeling fine other than some sinus drainage, was on antibiotics in August. Breathing is fine    Referring provider: Inda Coke, PA  HPI: 81 year old male, never smoker followed for COVID pneumonia and associated acute respiratory failure. He is a patient of Dr. Bari Mantis and last seen in office 03/26/2022. Past medical history significant for severe AS s/p TAVR, ILD, GERD, hypothyroid, HLD, obesity.  TEST/EVENTS:  02/23/2022 cardiac CT: Areas of peripheral predominant mild groundglass and architectural distortion bilaterally 04/27/2022 PFT: FVC 98, FEV1 117, ratio 88, TLC 101, DLCOcor 92.  No BD. 06/24/2022 echocardiogram: EF 55%, GIIDD, normal functioning TAVR with mean gradient of 7 mmHg and no PVL  03/26/2022: OV with Dr. Elsworth Soho for surgical clearance prior to TAVR.  Last seen 02/2021.  He was hospitalized January 2022 for COVID-pneumonia and acute hypoxic respiratory failure.  He required oxygen upon discharge.  Has recovered well since.  He was able to wean off oxygen so it was discontinued at previous visit.  Imaging was reviewed with residual scarring, likely related to previous COVID-pneumonia.  No suspicion for underlying ILD.  He has good activity tolerance.  Walks 1.5 miles per day.  PFTs were ordered to obtain lung function.  No desaturations on walking oximetry.  Cleared for TAVR.  09/28/2022: Today-follow-up Patient presents today for 40-monthfollow-up and to go over his pulmonary function testing that he completed in June 2023, which was normal.  He has been feeling well since he was here last.  Feels like he recovered well since his TAVR surgery.  Still exercising on a regular basis.  Walking about 1.5 miles per day.  He has no limitations from a respiratory standpoint.  Does not feel like he  gets short winded.  No significant cough or chest congestion.  He does have some sinus problems and recently treated with doxycycline for sinusitis. He is improved today. Feels like he has a problem every year during fall and spring, with the allergy seasons. Working with his PCP on this who is going to obtain a CT of his sinuses should he have recurrent symptoms. Currently using zyrtec, which he has been on for a while, and atrovent nasal spray. Overall doing quite well without any other respiratory concerns or complaints today.  Allergies  Allergen Reactions   Penicillins Rash    Immunization History  Administered Date(s) Administered   DTaP 02/08/2002   Fluad Quad(high Dose 65+) 09/04/2021, 08/14/2022   Influenza Split 08/20/2008   Influenza Whole 08/20/2008   Influenza, High Dose Seasonal PF 09/02/2015, 08/09/2017, 08/23/2018, 01/08/2020   Influenza-Unspecified 08/23/2020   Moderna Sars-Covid-2 Vaccination 02/22/2020, 03/23/2020   PPD Test 08/20/2008   Pneumococcal Conjugate-13 06/19/1997   Pneumococcal Polysaccharide-23 05/25/2012, 05/18/2018   Tdap 05/25/2012   Zoster Recombinat (Shingrix) 01/15/2021   Zoster, Live 04/12/2007    Past Medical History:  Diagnosis Date   Allergy    Arthritis    BPH (benign prostatic hyperplasia)    Cataract    [place]   left gastrocnemius DVT in setting of COVID-19   Dyslipidemia    Erectile dysfunction    GERD (gastroesophageal reflux disease)    Hx of adenomatous colonic polyps    Hyperlipidemia    Hypertension  Hypogonadism male    Hypothyroid    Obesity    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 MAngelena Formand Dr. BCyndia Bent  Severe aortic stenosis     Tobacco History: Social History   Tobacco Use  Smoking Status Never  Smokeless Tobacco Never  Tobacco Comments   He chews on a cigar   Counseling given: Not Answered Tobacco  comments: He chews on a cigar   Outpatient Medications Prior to Visit  Medication Sig Dispense Refill   acetaminophen (TYLENOL) 500 MG tablet Take 1,000 mg by mouth every 6 (six) hours as needed for moderate pain.     acetaminophen-codeine (TYLENOL #3) 300-30 MG tablet Take 1 tablet by mouth every 6 (six) hours as needed for moderate pain. 30 tablet 0   aspirin EC 81 MG tablet Take 81 mg by mouth daily. Swallow whole.     atorvastatin (LIPITOR) 20 MG tablet TAKE 1 TABLET (20 MG TOTAL) BY MOUTH DAILY. 90 tablet 1   Cholecalciferol (VITAMIN D3) 125 MCG (5000 UT) CAPS Take 5,000 Units by mouth daily.     diclofenac Sodium (VOLTAREN) 1 % GEL Apply 2 g topically daily as needed (pain).     doxycycline (VIBRA-TABS) 100 MG tablet Take 1 tablet (100 mg total) by mouth 2 (two) times daily. 14 tablet 0   ELDERBERRY PO Take 1 capsule by mouth daily.     enalapril (VASOTEC) 10 MG tablet TAKE 1 TABLET (10 MG TOTAL) BY MOUTH DAILY. 90 tablet 1   finasteride (PROSCAR) 5 MG tablet TAKE 1 TABLET (5 MG TOTAL) BY MOUTH DAILY. 90 tablet 1   guaifenesin (HUMIBID E) 400 MG TABS tablet Take 400 mg by mouth daily.     hydrochlorothiazide (MICROZIDE) 12.5 MG capsule TAKE 1 CAPSULE (12.5 MG TOTAL) BY MOUTH DAILY. 90 capsule 1   ipratropium (ATROVENT) 0.06 % nasal spray Place 2 sprays into both nostrils 4 (four) times daily. 15 mL 12   ketorolac (ACULAR) 0.5 % ophthalmic solution SMARTSIG:1 Drop(s) In Eye(s) Daily PRN     levothyroxine (SYNTHROID) 150 MCG tablet TAKE 1 TABLET EVERY MORNING PRIOR TO BREAKFAST 90 tablet 1   loratadine (CLARITIN) 10 MG tablet Take 10 mg by mouth daily.     meloxicam (MOBIC) 15 MG tablet TAKE 1 TABLET (15 MG TOTAL) BY MOUTH DAILY. 90 tablet 1   Misc Natural Products (URINOZINC PROSTATE) CAPS Take 1 capsule by mouth daily.     Multiple Vitamins-Minerals (IMMUNE SUPPORT PO) Take 1 tablet by mouth daily. NHR Immune Protect     Polyethyl Glycol-Propyl Glycol (SYSTANE OP) Place 1 drop into both  eyes daily as needed (irritation).     tamsulosin (FLOMAX) 0.4 MG CAPS capsule TAKE 1 CAPSULE (0.4 MG TOTAL) BY MOUTH DAILY. 90 capsule 1   No facility-administered medications prior to visit.     Review of Systems:   Constitutional: No weight loss or gain, night sweats, fevers, chills, fatigue, or lassitude. HEENT: No headaches, difficulty swallowing, tooth/dental problems, or sore throat. No sneezing, itching, ear ache +nasal congestion, post nasal drip (improved) CV:  No chest pain, orthopnea, PND, swelling in lower extremities, anasarca, dizziness, palpitations, syncope Resp: No shortness of breath with exertion or at rest. No excess mucus or change in color of mucus. No productive or non-productive. No hemoptysis. No wheezing.  No chest wall deformity GI:  No heartburn, indigestion, abdominal pain Skin: No rash, lesions, ulcerations MSK:  No joint pain  or swelling.  No decreased range of motion.  No back pain. Neuro: No dizziness or lightheadedness.  Psych: No depression or anxiety. Mood stable.     Physical Exam:  BP 128/80   Pulse 70   Ht '5\' 9"'$  (1.753 m)   Wt 247 lb 12.8 oz (112.4 kg)   SpO2 97%   BMI 36.59 kg/m   GEN: Pleasant, interactive, well-appearing; obese; in no acute distress. HEENT:  Normocephalic and atraumatic. PERRLA. Sclera white. Nasal turbinates boggy, moist and patent bilaterally. Clear rhinorrhea present. Oropharynx pink and moist, without exudate or edema. No lesions, ulcerations, or postnasal drip.  NECK:  Supple w/ fair ROM. No JVD present. Normal carotid impulses w/o bruits. Thyroid symmetrical with no goiter or nodules palpated. No lymphadenopathy.   CV: RRR, no m/r/g, no peripheral edema. Pulses intact, +2 bilaterally. No cyanosis, pallor or clubbing. PULMONARY:  Unlabored, regular breathing. Clear bilaterally A&P w/o wheezes/rales/rhonchi. No accessory muscle use.  GI: BS present and normoactive. Soft, non-tender to palpation. No organomegaly or  masses detected. MSK: No erythema, warmth or tenderness. Cap refil <2 sec all extrem. No deformities or joint swelling noted.  Neuro: A/Ox3. No focal deficits noted.   Skin: Warm, no lesions or rashe Psych: Normal affect and behavior. Judgement and thought content appropriate.     Lab Results:  CBC    Component Value Date/Time   WBC 9.0 05/27/2022 0321   RBC 4.28 05/27/2022 0321   HGB 12.0 (L) 05/27/2022 0321   HGB 14.3 03/06/2022 1000   HCT 36.5 (L) 05/27/2022 0321   HCT 42.2 03/06/2022 1000   PLT 200 05/27/2022 0321   PLT 274 03/06/2022 1000   MCV 85.3 05/27/2022 0321   MCV 82 03/06/2022 1000   MCH 28.0 05/27/2022 0321   MCHC 32.9 05/27/2022 0321   RDW 13.6 05/27/2022 0321   RDW 13.5 03/06/2022 1000   LYMPHSABS 1.4 07/14/2021 1113   MONOABS 0.6 07/14/2021 1113   EOSABS 0.2 07/14/2021 1113   BASOSABS 0.0 07/14/2021 1113    BMET    Component Value Date/Time   NA 137 05/27/2022 0321   NA 141 03/06/2022 1000   K 3.9 05/27/2022 0321   CL 107 05/27/2022 0321   CO2 23 05/27/2022 0321   GLUCOSE 109 (H) 05/27/2022 0321   BUN 14 05/27/2022 0321   BUN 18 03/06/2022 1000   CREATININE 0.92 05/27/2022 0321   CREATININE 0.87 10/23/2020 1409   CALCIUM 8.8 (L) 05/27/2022 0321   GFRNONAA >60 05/27/2022 0321    BNP No results found for: "BNP"   Imaging:  No results found.       Latest Ref Rng & Units 04/27/2022    8:51 AM  PFT Results  FVC-Pre L 3.86   FVC-Predicted Pre % 98   FVC-Post L 3.95   FVC-Predicted Post % 101   Pre FEV1/FVC % % 85   Post FEV1/FCV % % 88   FEV1-Pre L 3.28   FEV1-Predicted Pre % 117   FEV1-Post L 3.46   DLCO uncorrected ml/min/mmHg 21.33   DLCO UNC% % 89   DLCO corrected ml/min/mmHg 21.97   DLCO COR %Predicted % 92   DLVA Predicted % 94   TLC L 6.92   TLC % Predicted % 101   RV % Predicted % 116     No results found for: "NITRICOXIDE"      Assessment & Plan:   Post covid-19 condition, unspecified History of COVID  pneumonia for which he was hospitalized  January-February 2022. He quickly was weaned off supplemental O2 after discharge and has recovered well. No residual symptoms. CT imaging shows some mild scarring; no evidence of post COVID fibrosis/ILD. PFTs showed normal lung function, volumes, and diffusion capacity. Clinically stable without any respiratory concerns/complaints today.  Patient Instructions  Pulmonary function testing from June was normal. Happy to hear your doing well.  Try changing your allergy medicine to an alternative over the counter option; take daily Use saline nasal spray 2-3 times a day Continue atrovent 2 sprays up to 4 times a day  You can add on fluticasone nasal spray if you haven't already; 2 sprays each nostril daily  Follow up with your primary if your nasal symptoms persist since she has been working with you on this  Follow up in 6-12 months with Dr. Elsworth Soho or sooner if needed    S/P TAVR (transcatheter aortic valve replacement) Recovered well. Valve in good working condition on echocardiogram from August 2023. Follow up with cardiology as scheduled.   Allergic rhinitis Seasonal allergies; recent acute sinusitis treated with doxycycline course. Recovered well. He is going to try switching his daily antihistamine and add on flonase to see if this helps with his symptoms as well. If they persist or worsen, he has plans with his PCP to obtain CT of his sinuses. He will let us know if he needs anything from [place].    I spent 28 minutes of dedicated to the care of this patient on the date of this encounter to include pre-visit review of records, face-to-face time with the patient discussing conditions above, post visit ordering of testing, clinical documentation with the electronic health record, making appropriate referrals as documented, and communicating necessary findings to members of the patients care team.  Clayton Bibles, NP 09/28/2022  Pt aware and understands  NP's role.

## 2022-09-28 NOTE — Assessment & Plan Note (Signed)
History of COVID pneumonia for which he was hospitalized January-February 2022. He quickly was weaned off supplemental O2 after discharge and has recovered well. No residual symptoms. CT imaging shows some mild scarring; no evidence of post COVID fibrosis/ILD. PFTs showed normal lung function, volumes, and diffusion capacity. Clinically stable without any respiratory concerns/complaints today.  Patient Instructions  Pulmonary function testing from June was normal. Happy to hear your doing well.  Try changing your allergy medicine to an alternative over the counter option; take daily Use saline nasal spray 2-3 times a day Continue atrovent 2 sprays up to 4 times a day  You can add on fluticasone nasal spray if you haven't already; 2 sprays each nostril daily  Follow up with your primary if your nasal symptoms persist since she has been working with you on this  Follow up in 6-12 months with Dr. Elsworth Soho or sooner if needed

## 2022-09-28 NOTE — Patient Instructions (Signed)
Pulmonary function testing from June was normal. Happy to hear your doing well.  Try changing your allergy medicine to an alternative over the counter option; take daily Use saline nasal spray 2-3 times a day Continue atrovent 2 sprays up to 4 times a day  You can add on fluticasone nasal spray if you haven't already; 2 sprays each nostril daily  Follow up with your primary if your nasal symptoms persist since she has been working with you on this  Follow up in 6-12 months with Dr. Elsworth Soho or sooner if needed

## 2022-09-28 NOTE — Assessment & Plan Note (Signed)
Seasonal allergies; recent acute sinusitis treated with doxycycline course. Recovered well. He is going to try switching his daily antihistamine and add on flonase to see if this helps with his symptoms as well. If they persist or worsen, he has plans with his PCP to obtain CT of his sinuses. He will let us know if he needs anything from [place].

## 2022-10-20 ENCOUNTER — Ambulatory Visit (INDEPENDENT_AMBULATORY_CARE_PROVIDER_SITE_OTHER): Payer: Medicare PPO

## 2022-10-20 VITALS — Wt 243.0 lb

## 2022-10-20 DIAGNOSIS — Z0000 Encounter for general adult medical examination without abnormal findings: Secondary | ICD-10-CM

## 2022-10-20 NOTE — Patient Instructions (Signed)
[name] , Thank you for taking time to come for your Medicare Wellness Visit. I appreciate your ongoing commitment to your health goals. Please review the following plan we discussed and let me know if I can assist you in the future.   These are the goals we discussed:  Goals      Patient Stated     Lose weight         This is a list of the screening recommended for you and due dates:  Health Maintenance  Topic Date Due   DTaP/Tdap/Td vaccine (3 - Td or Tdap) 05/25/2022   Zoster (Shingles) Vaccine (2 of 2) 11/13/2022*   Medicare Annual Wellness Visit  10/21/2023   Pneumonia Vaccine  Completed   Flu Shot  Completed   HPV Vaccine  Aged Out   COVID-19 Vaccine  Discontinued  *Topic was postponed. The date shown is not the original due date.    Advanced directives: Please bring a copy of your health care power of attorney and living will to the office at your convenience.  Conditions/risks identified: lose weight   Next appointment: Follow up in one year for your annual wellness visit.   Preventive Care 80 Years and Older, Male  Preventive care refers to lifestyle choices and visits with your health care provider that can promote health and wellness. What does preventive care include? A yearly physical exam. This is also called an annual well check. Dental exams once or twice a year. Routine eye exams. Ask your health care provider how often you should have your eyes checked. Personal lifestyle choices, including: Daily care of your teeth and gums. Regular physical activity. Eating a healthy diet. Avoiding tobacco and drug use. Limiting alcohol use. Practicing safe sex. Taking low doses of aspirin every day. Taking vitamin and mineral supplements as recommended by your health care provider. What happens during an annual well check? The services and screenings done by your health care provider during your annual well check will depend on your age, overall health, lifestyle  risk factors, and family history of disease. Counseling  Your health care provider may ask you questions about your: Alcohol use. Tobacco use. Drug use. Emotional well-being. Home and relationship well-being. Sexual activity. Eating habits. History of falls. Memory and ability to understand (cognition). Work and work Statistician. Screening  You may have the following tests or measurements: Height, weight, and BMI. Blood pressure. Lipid and cholesterol levels. These may be checked every 5 years, or more frequently if you are over 80 years old. Skin check. Lung cancer screening. You may have this screening every year starting at age 80 if you have a 30-pack-year history of smoking and currently smoke or have quit within the past 15 years. Fecal occult blood test (FOBT) of the stool. You may have this test every year starting at age 80. Flexible sigmoidoscopy or colonoscopy. You may have a sigmoidoscopy every 5 years or a colonoscopy every 10 years starting at age 80. Prostate cancer screening. Recommendations will vary depending on your family history and other risks. Hepatitis C blood test. Hepatitis B blood test. Sexually transmitted disease (STD) testing. Diabetes screening. This is done by checking your blood sugar (glucose) after you have not eaten for a while (fasting). You may have this done every 1-3 years. Abdominal aortic aneurysm (AAA) screening. You may need this if you are a current or former smoker. Osteoporosis. You may be screened starting at age 80 if you are at high risk. Talk  with your health care provider about your test results, treatment options, and if necessary, the need for more tests. Vaccines  Your health care provider may recommend certain vaccines, such as: Influenza vaccine. This is recommended every year. Tetanus, diphtheria, and acellular pertussis (Tdap, Td) vaccine. You may need a Td booster every 10 years. Zoster vaccine. You may need this after age  80. Pneumococcal 13-valent conjugate (PCV13) vaccine. One dose is recommended after age 80. Pneumococcal polysaccharide (PPSV23) vaccine. One dose is recommended after age 80. Talk to your health care provider about which screenings and vaccines you need and how often you need them. This information is not intended to replace advice given to you by your health care provider. Make sure you discuss any questions you have with your health care provider. Document Released: 11/29/2015 Document Revised: 07/22/2016 Document Reviewed: 09/03/2015 Elsevier Interactive Patient Education  2017 [place] Prevention in the Home Falls can cause injuries. They can happen to people of all ages. There are many things you can do to make your home safe and to help prevent falls. What can I do on the outside of my home? Regularly fix the edges of walkways and driveways and fix any cracks. Remove anything that might make you trip as you walk through a door, such as a raised step or threshold. Trim any bushes or trees on the path to your home. Use bright outdoor lighting. Clear any walking paths of anything that might make someone trip, such as rocks or tools. Regularly check to see if handrails are loose or broken. Make sure that both sides of any steps have handrails. Any raised decks and porches should have guardrails on the edges. Have any leaves, snow, or ice cleared regularly. Use sand or salt on walking paths during winter. Clean up any spills in your garage right away. This includes oil or grease spills. What can I do in the bathroom? Use night lights. Install grab bars by the toilet and in the tub and shower. Do not use towel bars as grab bars. Use non-skid mats or decals in the tub or shower. If you need to sit down in the shower, use a plastic, non-slip stool. Keep the floor dry. Clean up any water that spills on the floor as soon as it happens. Remove soap buildup in the tub or shower  regularly. Attach bath mats securely with double-sided non-slip rug tape. Do not have throw rugs and other things on the floor that can make you trip. What can I do in the bedroom? Use night lights. Make sure that you have a light by your bed that is easy to reach. Do not use any sheets or blankets that are too big for your bed. They should not hang down onto the floor. Have a firm chair that has side arms. You can use this for support while you get dressed. Do not have throw rugs and other things on the floor that can make you trip. What can I do in the kitchen? Clean up any spills right away. Avoid walking on wet floors. Keep items that you use a lot in easy-to-reach places. If you need to reach something above you, use a strong step stool that has a grab bar. Keep electrical cords out of the way. Do not use floor polish or wax that makes floors slippery. If you must use wax, use non-skid floor wax. Do not have throw rugs and other things on the floor that can make you  trip. What can I do with my stairs? Do not leave any items on the stairs. Make sure that there are handrails on both sides of the stairs and use them. Fix handrails that are broken or loose. Make sure that handrails are as long as the stairways. Check any carpeting to make sure that it is firmly attached to the stairs. Fix any carpet that is loose or worn. Avoid having throw rugs at the top or bottom of the stairs. If you do have throw rugs, attach them to the floor with carpet tape. Make sure that you have a light switch at the top of the stairs and the bottom of the stairs. If you do not have them, ask someone to add them for you. What else can I do to help prevent falls? Wear shoes that: Do not have high heels. Have rubber bottoms. Are comfortable and fit you well. Are closed at the toe. Do not wear sandals. If you use a stepladder: Make sure that it is fully opened. Do not climb a closed stepladder. Make sure that  both sides of the stepladder are locked into place. Ask someone to hold it for you, if possible. Clearly mark and make sure that you can see: Any grab bars or handrails. First and last steps. Where the edge of each step is. Use tools that help you move around (mobility aids) if they are needed. These include: Canes. Walkers. Scooters. Crutches. Turn on the lights when you go into a dark area. Replace any light bulbs as soon as they burn out. Set up your furniture so you have a clear path. Avoid moving your furniture around. If any of your floors are uneven, fix them. If there are any pets around you, be aware of where they are. Review your medicines with your doctor. Some medicines can make you feel dizzy. This can increase your chance of falling. Ask your doctor what other things that you can do to help prevent falls. This information is not intended to replace advice given to you by your health care provider. Make sure you discuss any questions you have with your health care provider. Document Released: 08/29/2009 Document Revised: 04/09/2016 Document Reviewed: 12/07/2014 Elsevier Interactive Patient Education  2017 [employer].

## 2022-10-20 NOTE — Progress Notes (Signed)
I connected with  Mitchell Humphrey on 10/20/22 by a audio enabled telemedicine application and verified that I am speaking with the correct person using two identifiers.  Patient Location: Home  Provider Location: Office/Clinic  I discussed the limitations of evaluation and management by telemedicine. The patient expressed understanding and agreed to proceed.  Subjective:   Mitchell Humphrey is a 81 y.o. male who presents for an Initial Medicare Annual Wellness Visit.  Review of Systems     Cardiac Risk Factors include: advanced age (>19mn, >>34women);dyslipidemia;male gender;hypertension;obesity (BMI >30kg/m2)     Objective:    Today's Vitals   10/20/22 1351  Weight: 243 lb (110.2 kg)   Body mass index is 35.88 kg/m.     10/20/2022    1:59 PM 05/22/2022   11:21 AM 03/11/2022    9:55 AM 12/16/2020    4:43 AM  Advanced Directives  Does Patient Have a Medical Advance Directive? Yes No No Yes  Type of AParamedicof AMill HallLiving will   HRisonLiving will  Does patient want to make changes to medical advance directive?    No - Patient declined  Copy of [place] Chart? No - copy requested   No - copy requested  Would patient like information on creating a medical advance directive?  No - Patient declined No - Patient declined No - Patient declined    Current Medications (verified) Outpatient Encounter Medications as of 10/20/2022  Medication Sig   acetaminophen (TYLENOL) 500 MG tablet Take 1,000 mg by mouth every 6 (six) hours as needed for moderate pain.   acetaminophen-codeine (TYLENOL #3) 300-30 MG tablet Take 1 tablet by mouth every 6 (six) hours as needed for moderate pain.   aspirin EC 81 MG tablet Take 81 mg by mouth daily. Swallow whole.   atorvastatin (LIPITOR) 20 MG tablet TAKE 1 TABLET (20 MG TOTAL) BY MOUTH DAILY.   Cholecalciferol (VITAMIN D3) 125 MCG (5000 UT) CAPS Take 5,000 Units by mouth daily.    Cyanocobalamin (VITAMIN B 12 PO) Take by mouth.   diclofenac Sodium (VOLTAREN) 1 % GEL Apply 2 g topically daily as needed (pain).   ELDERBERRY PO Take 1 capsule by mouth daily.   enalapril (VASOTEC) 10 MG tablet TAKE 1 TABLET (10 MG TOTAL) BY MOUTH DAILY.   finasteride (PROSCAR) 5 MG tablet TAKE 1 TABLET (5 MG TOTAL) BY MOUTH DAILY.   hydrochlorothiazide (MICROZIDE) 12.5 MG capsule TAKE 1 CAPSULE (12.5 MG TOTAL) BY MOUTH DAILY.   ipratropium (ATROVENT) 0.06 % nasal spray Place 2 sprays into both nostrils 4 (four) times daily.   ketorolac (ACULAR) 0.5 % ophthalmic solution SMARTSIG:1 Drop(s) In Eye(s) Daily PRN   levothyroxine (SYNTHROID) 150 MCG tablet TAKE 1 TABLET EVERY MORNING PRIOR TO BREAKFAST   loratadine (CLARITIN) 10 MG tablet Take 10 mg by mouth daily.   meloxicam (MOBIC) 15 MG tablet TAKE 1 TABLET (15 MG TOTAL) BY MOUTH DAILY.   Multiple Vitamins-Minerals (IMMUNE SUPPORT PO) Take 1 tablet by mouth daily. NHR Immune Protect   oxybutynin (DITROPAN-XL) 5 MG 24 hr tablet Take 5 mg by mouth daily.   Polyethyl Glycol-Propyl Glycol (SYSTANE OP) Place 1 drop into both eyes daily as needed (irritation).   tamsulosin (FLOMAX) 0.4 MG CAPS capsule TAKE 1 CAPSULE (0.4 MG TOTAL) BY MOUTH DAILY.   [DISCONTINUED] doxycycline (VIBRA-TABS) 100 MG tablet Take 1 tablet (100 mg total) by mouth 2 (two) times daily.   [DISCONTINUED] guaifenesin (HUMIBID  E) 400 MG TABS tablet Take 400 mg by mouth daily.   [DISCONTINUED] Misc Natural Products (URINOZINC PROSTATE) CAPS Take 1 capsule by mouth daily.   No facility-administered encounter medications on file as of 10/20/2022.    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 MAngelena Formand Dr. BCyndia Bent  Severe aortic stenosis    Past Surgical History:  Procedure Laterality Date   APPENDECTOMY     COLONOSCOPY  08/2008   Dr. MWatt Climes  HERNIA REPAIR  1970   [facility]   INTRAOPERATIVE TRANSTHORACIC ECHOCARDIOGRAM N/A 05/26/2022   Procedure: INTRAOPERATIVE TRANSTHORACIC ECHOCARDIOGRAM;  Surgeon: MBurnell Blanks MD;  [facility];  Service: Open Heart Surgery;  Laterality: N/A;   RIGHT HEART CATH AND CORONARY ANGIOGRAPHY N/A 03/11/2022   Procedure: RIGHT HEART CATH AND CORONARY ANGIOGRAPHY;  Surgeon: MBurnell Blanks MD;  [facility];  Service: Cardiovascular;  Laterality: N/A;   TRANSCATHETER AORTIC VALVE REPLACEMENT, TRANSFEMORAL N/A 05/26/2022   Procedure: Transcatheter Aortic Valve Replacement, Transfemoral;  Surgeon: MBurnell Blanks MD;  [facility];  Service: Open Heart Surgery;  Laterality: N/A;   Family History  Problem Relation Age of Onset   Breast cancer Mother    Lung cancer Father    Brain cancer Father    Breast cancer Sister    Heart attack Sister    Social History   Socioeconomic History   Marital status: Married    Spouse name: Not on file   Number of children: 3   Years of education: Not on file   Highest education level: Not on file  Occupational History   Occupation: sScientist, research (medical)  Occupation: Retired-Construction  Tobacco Use   Smoking status: Never   Smokeless tobacco: Never   Tobacco comments:    He chews on a cigar  Vaping Use   Vaping Use: Never used  Substance and Sexual Activity   Alcohol use: No   Drug use: No   Sexual activity: Not Currently  Other Topics Concern   Not on file  Social History Narrative    Superintendent for construction site   Lives with wife   2 step children   2 sons   Social Determinants of Health   Financial Resource Strain: Low Risk  (10/20/2022)   Overall Financial Resource Strain (CARDIA)    Difficulty of Paying Living Expenses: Not hard at all  Food Insecurity: No Food Insecurity (10/20/2022)   Hunger Vital Sign    Worried About Running Out of Food in the Last Year: Never true    [place] the [place]: Never true  Transportation Needs: No Transportation Needs (10/20/2022)   PRAPARE - THydrologist(Medical): No    Lack of Transportation (Non-Medical): No  Physical Activity: Sufficiently Active (10/20/2022)   Exercise Vital Sign  Days of Exercise per Week: 7 days    Minutes of Exercise per Session: 30 min  Stress: No Stress Concern Present (10/20/2022)   [place]    Feeling of Stress : Not at all  Social Connections: Moderately Isolated (10/20/2022)   Social Connection and Isolation Panel [NHANES]    Frequency of Communication with Friends and Family: More than three times a week    Frequency of Social Gatherings with Friends and Family: More than three times a week    Attends Religious Services: Never    Marine scientist or Organizations: No    Attends Music therapist: Never    Marital Status: Married    Tobacco Counseling Counseling given: Not Answered Tobacco comments: He chews on a cigar   Clinical Intake:  Pre-visit preparation completed: Yes  Pain : No/denies pain     BMI - recorded: 35.88 Nutritional Status: BMI > 30  Obese Nutritional Risks: None Diabetes: No  How often do you need to have someone help you when you read instructions, pamphlets, or other written materials from your doctor or pharmacy?: 1 - Never  [place]  Interpreter Needed?: No  Information entered by :: Charlott Rakes,  LPN   Activities of Daily Living    10/20/2022    2:01 PM 05/26/2022   10:40 AM  In your present state of health, do you have any difficulty performing the following activities:  Hearing? 1   Comment hearing aids   Vision? 0   Difficulty concentrating or making decisions? 0   Walking or climbing stairs? 0   Dressing or bathing? 0   Doing errands, shopping? 0 0  Preparing Food and eating ? N   Using the Toilet? N   In the past six months, have you accidently leaked urine? Y   Comment at times   Do you have problems with loss of bowel control? N   Managing your Medications? N   Managing your Finances? N   Housekeeping or managing your Housekeeping? N     Patient Care Team: Inda Coke, [place] as PCP - General (Physician Assistant) O'Neal, Cassie Freer, MD as PCP - Cardiology (Cardiology) Bernerd Limbo, MD as Referring Physician (Family Medicine)  Indicate any recent Medical Services you may have received from other than [facility] providers in the past year (date may be approximate).     Assessment:   This is a routine wellness examination for [place].  Hearing/Vision screen Hearing Screening - Comments:: Pt wears hearing aids  Vision Screening - Comments:: Pt follows up with Dr Katy Fitch  annul eye exams   Dietary issues and exercise activities discussed: Current Exercise Habits: Home exercise routine, Type of exercise: walking;Other - see comments, Time (Minutes): 30, Frequency (Times/Week): 7, Weekly Exercise (Minutes/Week): 210   Goals Addressed             This Visit's Progress    Patient Stated       Lose weight        Depression Screen    10/20/2022    1:58 PM 07/06/2022    7:55 AM 06/02/2021   12:56 PM 10/23/2020    1:32 PM  PHQ 2/9 Scores  PHQ - 2 Score 0 0 0 0    Fall Risk    10/20/2022    2:00 PM  [place] in the past year? 1  Number falls in past yr: 1  Injury with Fall?  1  Comment bruised and scratches arms and hip  Risk for fall due to :  Impaired vision;Impaired balance/gait  Follow up Falls prevention discussed    FALL RISK PREVENTION PERTAINING TO THE HOME:  Any stairs in or around the home? Yes  If so, are there any without handrails? No  Home free of loose throw rugs in walkways, pet beds, electrical cords, etc? Yes  Adequate lighting in your home to reduce risk of falls? Yes   ASSISTIVE DEVICES UTILIZED TO PREVENT FALLS:  Life alert? No  Use of a cane, walker or w/c? No  Grab bars in the bathroom? No  Shower chair or bench in shower? No  Elevated toilet seat or a handicapped toilet? No   TIMED UP AND GO:  Was the test performed? No .  Cognitive Function:        10/20/2022    2:01 PM  6CIT Screen  What Year? 0 points  What month? 0 points  What time? 0 points  Count back from 20 0 points  Months in reverse 0 points  Repeat phrase 0 points  Total Score 0 points    Immunizations Immunization History  Administered Date(s) Administered   DTaP 02/08/2002   Fluad Quad(high Dose 65+) 09/04/2021, 08/14/2022   Influenza Split 08/20/2008   Influenza Whole 08/20/2008   Influenza, High Dose Seasonal PF 09/02/2015, 08/09/2017, 08/23/2018, 01/08/2020   Influenza-Unspecified 08/23/2020   Moderna Sars-Covid-2 Vaccination 02/22/2020, 03/23/2020   PPD Test 08/20/2008   Pneumococcal Conjugate-13 06/19/1997   Pneumococcal Polysaccharide-23 05/25/2012, 05/18/2018   Tdap 05/25/2012   Zoster Recombinat (Shingrix) 01/15/2021   Zoster, Live 04/12/2007    TDAP status: Due, Education has been provided regarding the importance of this vaccine. Advised may receive this vaccine at local pharmacy or Health Dept. Aware to provide a copy of the vaccination record if obtained from local pharmacy or Health Dept. Verbalized acceptance and understanding.  Flu Vaccine status: Up to date  Pneumococcal vaccine status: Up to date  Covid-19 vaccine status: Completed vaccines  Qualifies for Shingles Vaccine? Yes   Zostavax  completed Yes   Shingrix Completed?: Yes  Screening Tests Health Maintenance  Topic Date Due   DTaP/Tdap/Td (3 - Td or Tdap) 05/25/2022   Zoster Vaccines- Shingrix (2 of 2) 11/13/2022 (Originally 03/12/2021)   Medicare Annual Wellness (AWV)  10/21/2023   Pneumonia Vaccine 55+ Years old  Completed   INFLUENZA VACCINE  Completed   HPV VACCINES  Aged Out   COVID-19 Vaccine  Discontinued    Health Maintenance  Health Maintenance Due  Topic Date Due   DTaP/Tdap/Td (3 - Td or Tdap) 05/25/2022    Colorectal cancer screening: No longer required.    Additional Screening:   Vision Screening: Recommended annual ophthalmology exams for early detection of glaucoma and other disorders of the eye. Is the patient up to date with their annual eye exam?  Yes  Who is the provider or what is the name of the office in which the patient attends annual eye exams? Dr Katy Fitch  If pt is not established with a provider, would they like to be referred to a provider to establish care? No .   Dental Screening: Recommended annual dental exams for proper oral hygiene  Community Resource Referral / Chronic Care Management: CRR required this visit?  No   CCM required this visit?  No      Plan:     I have personally reviewed and noted the following in the patient's  chart:   Medical and social history Use of alcohol, tobacco or illicit drugs  Current medications and supplements including opioid prescriptions. Patient is currently taking opioid prescriptions. Information provided to patient regarding non-opioid alternatives. Patient advised to discuss non-opioid treatment plan with their provider. Functional ability and status Nutritional status Physical activity Advanced directives List of other physicians Hospitalizations, surgeries, and ER visits in previous 12 months Vitals Screenings to include cognitive, depression, and falls Referrals and appointments  In addition, I have reviewed and  discussed with patient certain preventive protocols, quality metrics, and best practice recommendations. A written personalized care plan for preventive services as well as general preventive health recommendations were provided to patient.     Willette Brace, LPN   07/19/8181   Nurse Notes: none

## 2022-10-26 NOTE — Progress Notes (Signed)
Cardiology Office Note:   Date:  10/27/2022  NAME:  Mitchell Humphrey    MRN: [id number] DOB:  09/25/1941   PCP:  Jarold Motto, PA  Cardiologist:  Reatha Harps, MD  Electrophysiologist:  None   Referring MD: Jarold Motto, PA   Chief Complaint  Patient presents with   Follow-up    History of Present Illness:   Mitchell Humphrey is a 81 y.o. male with a hx of severe AS s/p TAVR, HTN, HLD who presents for follow-up.  Doing well since TAVR.  Walking 1.5 miles per day.  No significant chest pain or trouble breathing.  Blood pressure slightly elevated.  Values are running high at home.  We will increase his enalapril.  He understands that he needs antibiotics before dental work.  Cholesterol level at goal.  No significant CAD at the time of cath.  Overall doing well.  Problem List 1. DVT -L gastrocnemius vein 12/17/2019 -in setting of Covid PNA 2. 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 HTN 4. HLD -T chol 135, HDL 37, LDL 61, TG 181 5.  CAD -calcium score 1599 (81st percentile) 6. DM -A1c 5.9 7. Non-obstructive CAD 8. 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 Clifton James and Dr. Laneta Simmers   Severe aortic stenosis     Past Surgical History: Past Surgical History:  Procedure Laterality Date   APPENDECTOMY     COLONOSCOPY  08/2008   Dr. Ewing Schlein   HERNIA REPAIR  1970   [facility]    INTRAOPERATIVE TRANSTHORACIC ECHOCARDIOGRAM N/A 05/26/2022   Procedure: INTRAOPERATIVE TRANSTHORACIC ECHOCARDIOGRAM;  Surgeon: Kathleene Hazel, MD;  Location: [facility];  Service: Open Heart Surgery;  Laterality: N/A;   RIGHT HEART CATH AND CORONARY ANGIOGRAPHY N/A 03/11/2022   Procedure: RIGHT HEART CATH AND CORONARY ANGIOGRAPHY;  Surgeon: Kathleene Hazel, MD;  Location: [facility];  Service: Cardiovascular;  Laterality: N/A;   TRANSCATHETER AORTIC VALVE REPLACEMENT, TRANSFEMORAL N/A 05/26/2022   Procedure: Transcatheter Aortic Valve Replacement, Transfemoral;  Surgeon: Kathleene Hazel, MD;  Location: [facility];  Service: Open Heart Surgery;  Laterality: N/A;    Current Medications: Current Meds  Medication Sig   acetaminophen (TYLENOL) 500 MG tablet Take 1,000 mg by mouth every 6 (six) hours as needed for moderate pain.   acetaminophen-codeine (TYLENOL #3) 300-30 MG tablet Take 1 tablet by mouth every 6 (six) hours as needed for moderate pain.   aspirin EC 81 MG tablet Take 81 mg by mouth daily. Swallow whole.   atorvastatin (LIPITOR) 20 MG tablet TAKE 1 TABLET (20 MG TOTAL) BY MOUTH DAILY.   cetirizine (ZYRTEC) 10 MG tablet Take 10 mg by mouth daily.   Cholecalciferol (VITAMIN D3) 125 MCG (5000 UT) CAPS Take 5,000 Units by mouth daily.   Cyanocobalamin (  VITAMIN B 12 PO) Take by mouth.   diclofenac Sodium (VOLTAREN) 1 % GEL Apply 2 g topically daily as needed (pain).   ELDERBERRY PO Take 1 capsule by mouth daily.   finasteride (PROSCAR) 5 MG tablet TAKE 1 TABLET (5 MG TOTAL) BY MOUTH DAILY.   hydrochlorothiazide (MICROZIDE) 12.5 MG capsule TAKE 1 CAPSULE (12.5 MG TOTAL) BY MOUTH DAILY.   ipratropium (ATROVENT) 0.06 % nasal spray Place 2 sprays into both nostrils 4 (four) times daily.   ketorolac (ACULAR) 0.5 % ophthalmic solution SMARTSIG:1 Drop(s) In Eye(s) Daily PRN   levothyroxine (SYNTHROID) 150 MCG tablet TAKE 1 TABLET EVERY MORNING  PRIOR TO BREAKFAST   meloxicam (MOBIC) 15 MG tablet TAKE 1 TABLET (15 MG TOTAL) BY MOUTH DAILY.   Multiple Vitamins-Minerals (IMMUNE SUPPORT PO) Take 1 tablet by mouth daily. NHR Immune Protect   oxybutynin (DITROPAN-XL) 5 MG 24 hr tablet Take 5 mg by mouth daily.   Polyethyl Glycol-Propyl Glycol (SYSTANE OP) Place 1 drop into both eyes daily as needed (irritation).   tamsulosin (FLOMAX) 0.4 MG CAPS capsule TAKE 1 CAPSULE (0.4 MG TOTAL) BY MOUTH DAILY.   [DISCONTINUED] enalapril (VASOTEC) 10 MG tablet TAKE 1 TABLET (10 MG TOTAL) BY MOUTH DAILY.     Allergies:    Penicillins   Social History: Social History   Socioeconomic History   Marital status: Married    Spouse name: Not on file   Number of children: 3   Years of education: Not on file   Highest education level: Not on file  Occupational History   Occupation: Administrator, Civil Service   Occupation: Retired-Construction  Tobacco Use   Smoking status: Never   Smokeless tobacco: Never   Tobacco comments:    He chews on a cigar  Vaping Use   Vaping Use: Never used  Substance and Sexual Activity   Alcohol use: No   Drug use: No   Sexual activity: Not Currently  Other Topics Concern   Not on file  Social History Narrative   Superintendent for construction site   Lives with wife   2 step children   2 sons   Social Determinants of Health   Financial Resource Strain: Low Risk  (10/20/2022)   Overall Financial Resource Strain (CARDIA)    Difficulty of Paying Living Expenses: Not hard at all  Food Insecurity: No Food Insecurity (10/20/2022)   Hunger Vital Sign    Worried About Running Out of Food in the Last Year: Never true    Ran Out of Food in the Last Year: Never true  Transportation Needs: No Transportation Needs (10/20/2022)   PRAPARE - Administrator, Civil Service (Medical): No    Lack of Transportation (Non-Medical): No  Physical Activity: Sufficiently Active (10/20/2022)   Exercise Vital Sign    Days  of Exercise per Week: 7 days    Minutes of Exercise per Session: 30 min  Stress: No Stress Concern Present (10/20/2022)   [employer] of Occupational Health - Occupational Stress Questionnaire    Feeling of Stress : Not at all  Social Connections: Moderately Isolated (10/20/2022)   Social Connection and Isolation Panel [NHANES]    Frequency of Communication with Friends and Family: More than three times a week    Frequency of Social Gatherings with Friends and Family: More than three times a week    Attends Religious Services: Never    Database administrator or Organizations: No    Attends Banker Meetings: Never  Marital Status: Married     Family History: The patient's family history includes Brain cancer in his father; Breast cancer in his mother and sister; Heart attack in his sister; Lung cancer in his father.  ROS:   All other ROS reviewed and negative. Pertinent positives noted in the HPI.     EKGs/Labs/Other Studies Reviewed:   The following studies were personally reviewed by me today:  LHC 03/11/2022   Mid RCA lesion is 20% stenosed.   1st Mrg lesion is 20% stenosed.   Mid LAD lesion is 20% stenosed.   Mild non-obstructive CAD Mild elevation LV filling pressure and wedge pressure  TTE 06/24/2022  1. Left ventricular ejection fraction, by estimation, is 55 to 60%. The  left ventricle has normal function. The left ventricle has no regional  wall motion abnormalities. Left ventricular diastolic parameters are  consistent with Grade II diastolic  dysfunction (pseudonormalization).   2. Right ventricular systolic function is normal. The right ventricular  size is normal. Tricuspid regurgitation signal is inadequate for assessing  PA pressure.   3. Left atrial size was mildly dilated.   4. The mitral valve is normal in structure. Trivial mitral valve  regurgitation. No evidence of mitral stenosis.   5. Bioprosthetic aortic valve s/p TAVR. 29 mm Edwards  Sapien THV. Mean  gradient 7 mmHg. DI 0.5, EOA 2.28 cm^2. No significant perivalvular  leakage noted. Normal bioprosthetic valve function.   6. The inferior vena cava is dilated in size with >50% respiratory  variability, suggesting right atrial pressure of 8 mmHg.   Recent Labs: 01/07/2022: TSH 0.514 05/22/2022: ALT 25 05/27/2022: BUN 14; Creatinine, Ser 0.92; Hemoglobin 12.0; Magnesium 2.0; Platelets 200; Potassium 3.9; Sodium 137   Recent Lipid Panel    Component Value Date/Time   CHOL 135 07/14/2021 1113   TRIG 181.0 (H) 07/14/2021 1113   HDL 37.30 (L) 07/14/2021 1113   CHOLHDL 4 07/14/2021 1113   VLDL 36.2 07/14/2021 1113   LDLCALC 61 07/14/2021 1113    Physical Exam:   VS:  BP (!) 149/75   Pulse 74   Ht 5\' 10"  (1.778 m)   Wt 247 lb 12.8 oz (112.4 kg)   SpO2 96%   BMI 35.56 kg/m    Wt Readings from Last 3 Encounters:  10/27/22 247 lb 12.8 oz (112.4 kg)  10/20/22 243 lb (110.2 kg)  09/28/22 247 lb 12.8 oz (112.4 kg)    General: Well nourished, well developed, in no acute distress Head: Atraumatic, normal size  Eyes: PEERLA, EOMI  Neck: Supple, no JVD Endocrine: No thryomegaly Cardiac: Normal S1, S2; RRR; no murmurs, rubs, or gallops Lungs: Clear to auscultation bilaterally, no wheezing, rhonchi or rales  Abd: Soft, nontender, no hepatomegaly  Ext: No edema, pulses 2+ Musculoskeletal: No deformities, BUE and BLE strength normal and equal Skin: Warm and dry, no rashes   Neuro: Alert and oriented to person, place, time, and situation, CNII-XII grossly intact, no focal deficits  Psych: Normal mood and affect   ASSESSMENT:   Mitchell Humphrey is a 81 y.o. male who presents for the following: 1. S/P TAVR (transcatheter aortic valve replacement)   2. Primary hypertension   3. Hypercholesterolemia     PLAN:   1. S/P TAVR (transcatheter aortic valve replacement) -Doing well since TAVR.  No symptoms of chest pain or shortness of breath.  Continue with regular exercise.  He  is on aspirin 81 mg daily.  He will have a follow-up echo in 1  year.  Stable gradients on recent echo.  He needs antibiotics before dental work.  He understands this.  2. Primary hypertension -Increase enalapril to 20 mg daily.  Continue HCTZ.  3. Hypercholesterolemia -Lipids at goal.  Nonobstructive CAD.  Continue statin therapy.  Disposition: Return in about 1 year (around 10/28/2023).  Medication Adjustments/Labs and Tests Ordered: Current medicines are reviewed at length with the patient today.  Concerns regarding medicines are outlined above.  No orders of the defined types were placed in this encounter.  Meds ordered this encounter  Medications   enalapril (VASOTEC) 20 MG tablet    Sig: Take 1 tablet (20 mg total) by mouth daily.    Dispense:  90 tablet    Refill:  3    Patient Instructions  Medication Instructions:  Increase Enalapril to 20 mg daily   *If you need a refill on your cardiac medications before your next appointment, please call your pharmacy*   Follow-Up: At [facility], you and your health needs are our priority.  As part of our continuing mission to provide you with exceptional heart care, we have created designated Provider Care Teams.  These Care Teams include your primary Cardiologist (physician) and Advanced Practice Providers (APPs -  Physician Assistants and Nurse Practitioners) who all work together to provide you with the care you need, when you need it.  We recommend signing up for the patient portal called "MyChart".  Sign up information is provided on this After Visit Summary.  MyChart is used to connect with patients for Virtual Visits (Telemedicine).  Patients are able to view lab/test results, encounter notes, upcoming appointments, etc.  Non-urgent messages can be sent to your provider as well.   To learn more about what you can do with MyChart, go to [email or web address].    Your next appointment:   12 month(s)  The format for  your next appointment:   In Person  Provider:   Reatha Harps, MD           Time Spent with Patient: I have spent a total of 25 minutes with patient reviewing hospital notes, telemetry, EKGs, labs and examining the patient as well as establishing an assessment and plan that was discussed with the patient.  > 50% of time was spent in direct patient care.  Signed, Lenna Gilford. Flora Lipps, MD, [facility]  [facility]  [address], [address] [place], [place] [address] [phone]  10/27/2022 9:39 AM

## 2022-10-27 ENCOUNTER — Ambulatory Visit: Payer: Medicare PPO | Attending: Cardiovascular Disease | Admitting: Cardiovascular Disease

## 2022-10-27 ENCOUNTER — Encounter: Payer: Self-pay | Admitting: Cardiovascular Disease

## 2022-10-27 VITALS — BP 149/75 | HR 74 | Ht 70.0 in | Wt 247.8 lb

## 2022-10-27 DIAGNOSIS — I10 Essential (primary) hypertension: Secondary | ICD-10-CM

## 2022-10-27 DIAGNOSIS — E7800 Pure hypercholesterolemia, unspecified: Secondary | ICD-10-CM

## 2022-10-27 DIAGNOSIS — Z952 Presence of prosthetic heart valve: Secondary | ICD-10-CM

## 2022-10-27 MED ORDER — ENALAPRIL MALEATE 20 MG PO TABS
20.0000 mg | ORAL_TABLET | Freq: Every day | ORAL | 3 refills | Status: DC
  Filled 2022-12-31: qty 90, 90d supply, fill #0

## 2022-10-27 NOTE — Patient Instructions (Signed)
Medication Instructions:  Increase Enalapril to 20 mg daily   *If you need a refill on your cardiac medications before your next appointment, please call your pharmacy*   Follow-Up: At [facility], you and your health needs are our priority.  As part of our continuing mission to provide you with exceptional heart care, we have created designated Provider Care Teams.  These Care Teams include your primary Cardiologist (physician) and Advanced Practice Providers (APPs -  Physician Assistants and Nurse Practitioners) who all work together to provide you with the care you need, when you need it.  We recommend signing up for the patient portal called "MyChart".  Sign up information is provided on this After Visit Summary.  MyChart is used to connect with patients for Virtual Visits (Telemedicine).  Patients are able to view lab/test results, encounter notes, upcoming appointments, etc.  Non-urgent messages can be sent to your provider as well.   To learn more about what you can do with MyChart, go to [email or web address].    Your next appointment:   12 month(s)  The format for your next appointment:   In Person  Provider:   Evalina Field, MD

## 2022-12-01 ENCOUNTER — Other Ambulatory Visit (HOSPITAL_COMMUNITY): Payer: Self-pay

## 2022-12-01 ENCOUNTER — Telehealth: Payer: Self-pay | Admitting: Cardiovascular Disease

## 2022-12-01 MED FILL — Hydrochlorothiazide Cap 12.5 MG: ORAL | 90 days supply | Qty: 90 | Fill #0 | Status: CN

## 2022-12-01 NOTE — Telephone Encounter (Signed)
Pt c/o BP issue: STAT if pt c/o blurred vision, one-sided weakness or slurred speech  1. What are your last 5 BP readings? Systolic ranging 563 -875  2. Are you having any other symptoms (ex. Dizziness, headache, blurred vision, passed out)? Lightheaded   3. What is your BP issue? Hypertension for the past couple days     Started the morning of 64/33 with systolic 295.

## 2022-12-01 NOTE — Telephone Encounter (Signed)
Called pt. Spoke with his wife. She states he took his blood pressure and it was 170 something, we went to [facility] and it was the same. I'm not sure what the lower number is." Called other provided number due to her not being with the pt at this time. Called provided number. He states he is only lightheaded at the present time. Denies all other symptoms. He has not nee recording his blood pressures. Pt advised to start recording them and report back with numbers. Will get message to Dr. Audie Box for recommendations at this time. Went over ED precautions with pt.

## 2022-12-02 NOTE — Telephone Encounter (Signed)
Called patient spoke with wife, advised of message from MD.   Patient wife verbalized understanding.   She will call back with updates.

## 2022-12-04 NOTE — Telephone Encounter (Signed)
Contacted patient, he states his blood pressure is increasing at night time- see BP results below.   He states that he did notice some dizziness, and a headache (which causes him to check the BP).   I did go ahead and schedule for a in office visit on 12/11/22 at 10:30 AM. Advised patient to continue to keep a log, he is taking his medications Enalapril 20 mg (which was increased recently), HCTZ 12.5 mg daily. Advised to check BP/HR about 1 hour after medications, and bring cuff with him to his visit next week.  Patient states his BP this morning was 159/88 HR 66. He denies changes to his diet, and denies symptoms at this time.   Advised I would route to MD to review and would call back with any other recommendations.

## 2022-12-04 NOTE — Telephone Encounter (Signed)
Pt c/o BP issue: STAT if pt c/o blurred vision, one-sided weakness or slurred speech  1. What are your last 5 BP readings?  01/17 - AM 166/82  HR 70 -  PM  184/98 HR 86 01/18 - AM 163/86 HR 74  -  PM 191/102 HR 76 01/19 - AM 159/83 HR 66   2. Are you having any other symptoms (ex. Dizziness, headache, blurred vision, passed out)? Dizziness and yesterday had a little bit of blurred vision in the mornings when he first wakes up.   3. What is your BP issue? Pt is calling to report bp readings.

## 2022-12-08 ENCOUNTER — Telehealth: Payer: Self-pay | Admitting: Cardiovascular Disease

## 2022-12-08 NOTE — Telephone Encounter (Signed)
Pt is calling to update his insurance and is requesting return call.

## 2022-12-10 NOTE — Progress Notes (Signed)
Cardiology Office Note:   Date:  12/11/2022  NAME:  Mitchell Humphrey    MRN: [id number] DOB:  1941-06-23   PCP:  Inda Coke, PA  Cardiologist:  Evalina Field, MD  Electrophysiologist:  None   Referring MD: Inda Coke, PA   Chief Complaint  Patient presents with   Follow-up   History of Present Illness:   Mitchell Humphrey is a 82 y.o. male with a hx of severe AS s/p TAVR, non-obstructive CAD, DM, PAD who presents for follow-up.  He reports his blood pressure has been elevated at home.  Values at home between 160 and 180.  BP today 126/70.  He denies any chest pain or trouble breathing.  He reports poor sleep.  He does report chronic congestion.  He is working with his primary care physician on this.  He has no chest pain or trouble breathing today.  Euvolemic on examination.  I did review his CT scan from his TAVR workup.  No evidence of renal artery stenosis.  CV exam normal.  Remains on aspirin.  Understands he needs antibiotics for dental prophylaxis.  Problem List 1. DVT -L gastrocnemius vein 12/17/2019 -in setting of Covid PNA 2. 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 HTN 4. HLD -T chol 135, HDL 37, LDL 61, TG 181 5.  CAD -calcium score 1599 (81st percentile) 6. DM -A1c 5.9 7. Non-obstructive CAD 8. 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 MAngelena Formand Dr.  BCyndia Bent  Severe aortic stenosis     Past Surgical History: Past Surgical History:  Procedure Laterality Date   APPENDECTOMY     COLONOSCOPY  08/2008   Dr. MWatt Climes  HERNIA REPAIR  1970   [facility]   INTRAOPERATIVE TRANSTHORACIC ECHOCARDIOGRAM N/A 05/26/2022   Procedure: INTRAOPERATIVE TRANSTHORACIC ECHOCARDIOGRAM;  Surgeon: MBurnell Blanks MD;  [facility];  Service: Open Heart Surgery;  Laterality: N/A;   RIGHT HEART CATH AND CORONARY ANGIOGRAPHY N/A 03/11/2022   Procedure: RIGHT HEART CATH AND CORONARY ANGIOGRAPHY;  Surgeon: MBurnell Blanks MD;  [facility];  Service: Cardiovascular;  Laterality: N/A;   TRANSCATHETER AORTIC VALVE REPLACEMENT, TRANSFEMORAL N/A 05/26/2022   Procedure: Transcatheter Aortic Valve Replacement, Transfemoral;  Surgeon: MBurnell Blanks MD;  [facility];  Service: Open Heart Surgery;  Laterality: N/A;    Current Medications: Current Meds  Medication Sig   acetaminophen (TYLENOL) 500 MG tablet Take 1,000 mg by mouth every 6 (six) hours as needed for moderate pain.   acetaminophen-codeine (TYLENOL #3) 300-30 MG tablet Take 1 tablet by mouth every 6 (six) hours as needed for moderate pain.   aspirin EC 81 MG tablet Take 81 mg by mouth daily. Swallow whole.   atorvastatin (LIPITOR) 20 MG tablet TAKE 1 TABLET (  20 MG TOTAL) BY MOUTH DAILY.   cetirizine (ZYRTEC) 10 MG tablet Take 10 mg by mouth daily.   Cholecalciferol (VITAMIN D3) 125 MCG (5000 UT) CAPS Take 5,000 Units by mouth daily.   Cyanocobalamin (VITAMIN B 12 PO) Take by mouth.   diclofenac Sodium (VOLTAREN) 1 % GEL Apply 2 g topically daily as needed (pain).   ELDERBERRY PO Take 1 capsule by mouth daily.   enalapril (VASOTEC) 20 MG tablet Take 1 tablet (20 mg total) by mouth daily.   finasteride (PROSCAR) 5 MG tablet TAKE 1 TABLET (5 MG TOTAL) BY MOUTH DAILY.   ipratropium (ATROVENT) 0.06 % nasal spray Place 2 sprays into both  nostrils 4 (four) times daily.   ketorolac (ACULAR) 0.5 % ophthalmic solution SMARTSIG:1 Drop(s) In Eye(s) Daily PRN   levothyroxine (SYNTHROID) 150 MCG tablet TAKE 1 TABLET EVERY MORNING PRIOR TO BREAKFAST   loratadine (CLARITIN) 10 MG tablet Take 10 mg by mouth daily.   meloxicam (MOBIC) 15 MG tablet TAKE 1 TABLET (15 MG TOTAL) BY MOUTH DAILY.   Multiple Vitamins-Minerals (IMMUNE SUPPORT PO) Take 1 tablet by mouth daily. NHR Immune Protect   oxybutynin (DITROPAN-XL) 5 MG 24 hr tablet Take 5 mg by mouth daily.   Polyethyl Glycol-Propyl Glycol (SYSTANE OP) Place 1 drop into both eyes daily as needed (irritation).   spironolactone (ALDACTONE) 25 MG tablet Take 1 tablet (25 mg total) by mouth daily.   tamsulosin (FLOMAX) 0.4 MG CAPS capsule TAKE 1 CAPSULE (0.4 MG TOTAL) BY MOUTH DAILY.   [DISCONTINUED] hydrochlorothiazide (MICROZIDE) 12.5 MG capsule TAKE 1 CAPSULE (12.5 MG TOTAL) BY MOUTH DAILY.     Allergies:    Penicillins   Social History: Social History   Socioeconomic History   Marital status: Married    Spouse name: Not on file   Number of children: 3   Years of education: Not on file   Highest education level: Not on file  Occupational History   Occupation: Scientist, research (medical)   Occupation: Retired-Construction  Tobacco Use   Smoking status: Never   Smokeless tobacco: Never   Tobacco comments:    He chews on a cigar  Vaping Use   Vaping Use: Never used  Substance and Sexual Activity   Alcohol use: No   Drug use: No   Sexual activity: Not Currently  Other Topics Concern   Not on file  Social History Narrative   Superintendent for construction site   Lives with wife   2 step children   2 sons   Social Determinants of Health   Financial Resource Strain: Low Risk  (10/20/2022)   Overall Financial Resource Strain (CARDIA)    Difficulty of Paying Living Expenses: Not hard at all  Food Insecurity: No Food Insecurity (10/20/2022)   Hunger Vital Sign    Worried  About Running Out of Food in the Last Year: Never true    [place] in the Last Year: Never true  Transportation Needs: No Transportation Needs (10/20/2022)   PRAPARE - Hydrologist (Medical): No    Lack of Transportation (Non-Medical): No  Physical Activity: Sufficiently Active (10/20/2022)   Exercise Vital Sign    Days of Exercise per Week: 7 days    Minutes of Exercise per Session: 30 min  Stress: No Stress Concern Present (10/20/2022)   [place]    Feeling of Stress : Not at all  Social Connections: Moderately Isolated (10/20/2022)  Social Licensed conveyancer [NHANES]    Frequency of Communication with Friends and Family: More than three times a week    Frequency of Social Gatherings with Friends and Family: More than three times a week    Attends Religious Services: Never    Marine scientist or Organizations: No    Attends Music therapist: Never    Marital Status: Married     Family History: The patient's family history includes Brain cancer in his father; Breast cancer in his mother and sister; Heart attack in his sister; Lung cancer in his father.  ROS:   All other ROS reviewed and negative. Pertinent positives noted in the HPI.     EKGs/Labs/Other Studies Reviewed:   The following studies were personally reviewed by me today:  Recent Labs: 01/07/2022: TSH 0.514 05/22/2022: ALT 25 05/27/2022: BUN 14; Creatinine, Ser 0.92; Hemoglobin 12.0; Magnesium 2.0; Platelets 200; Potassium 3.9; Sodium 137   Recent Lipid Panel    Component Value Date/Time   CHOL 135 07/14/2021 1113   TRIG 181.0 (H) 07/14/2021 1113   HDL 37.30 (L) 07/14/2021 1113   CHOLHDL 4 07/14/2021 1113   VLDL 36.2 07/14/2021 1113   LDLCALC 61 07/14/2021 1113    Physical Exam:   VS:  BP 126/70   Pulse 84   Ht '5\' 10"'$  (1.778 m)   Wt 244 lb 12.8 oz (111 kg)   SpO2 95%   BMI  35.13 kg/m    Wt Readings from Last 3 Encounters:  12/11/22 244 lb 12.8 oz (111 kg)  10/27/22 247 lb 12.8 oz (112.4 kg)  10/20/22 243 lb (110.2 kg)    General: Well nourished, well developed, in no acute distress Head: Atraumatic, normal size  Eyes: PEERLA, EOMI  Neck: Supple, no JVD Endocrine: No thryomegaly Cardiac: Normal S1, S2; RRR; no murmurs, rubs, or gallops Lungs: Clear to auscultation bilaterally, no wheezing, rhonchi or rales  Abd: Soft, nontender, no hepatomegaly  Ext: No edema, pulses 2+ Musculoskeletal: No deformities, BUE and BLE strength normal and equal Skin: Warm and dry, no rashes   Neuro: Alert and oriented to person, place, time, and situation, CNII-XII grossly intact, no focal deficits  Psych: Normal mood and affect   ASSESSMENT:   Mitchell Humphrey is a 82 y.o. male who presents for the following: 1. Primary hypertension   2. S/P TAVR (transcatheter aortic valve replacement)   3. Agatston coronary artery calcium score greater than 400   4. Coronary artery disease involving native coronary artery of native heart without angina pectoris   5. Hypercholesterolemia     PLAN:   1. Primary hypertension -BP elevated at home.  Stop HCTZ.  Start Aldactone 25 mg daily.  Recheck CMP, TSH, CBC and lipids today.  He will continue enalapril 20 mg daily.  He will continue to keep a log.  We discussed improving exercise goals as well as working on salt reduction.  Seems to be doing well here.  We will see him back in 3 months to further assess.  If blood pressure values continue to elevate we will continue renal artery ultrasound.  2. S/P TAVR (transcatheter aortic valve replacement) -Status post TAVR.  Recent echo normal.  Continue aspirin 81 mg daily.  He understands he needs antibiotics before dental work.  3. Agatston coronary artery calcium score greater than 400 4. Coronary artery disease involving native coronary artery of native heart without angina pectoris 5.  Hypercholesterolemia -Elevated calcium score.  Nonobstructive  disease at cath.  Recheck lipids today.  Continue Lipitor 20 mg daily.  Disposition: Return in about 3 months (around 03/12/2023).  Medication Adjustments/Labs and Tests Ordered: Current medicines are reviewed at length with the patient today.  Concerns regarding medicines are outlined above.  Orders Placed This Encounter  Procedures   CBC   Comprehensive metabolic panel   TSH   Lipid panel   Hemoglobin A1c   Meds ordered this encounter  Medications   spironolactone (ALDACTONE) 25 MG tablet    Sig: Take 1 tablet (25 mg total) by mouth daily.    Dispense:  90 tablet    Refill:  3    Patient Instructions  Medication Instructions:  STOP HCTZ  START Aldactone 25 mg daily   *If you need a refill on your cardiac medications before your next appointment, please call your pharmacy*   Lab Work: CBC, CMET, TSH, LIPID, A1C today   If you have labs (blood work) drawn today and your tests are completely normal, you will receive your results only by: [place] (if you have MyChart) OR A paper copy in the mail If you have any lab test that is abnormal or we need to change your treatment, we will call you to review the results.   Follow-Up: At [facility], you and your health needs are our priority.  As part of our continuing mission to provide you with exceptional heart care, we have created designated Provider Care Teams.  These Care Teams include your primary Cardiologist (physician) and Advanced Practice Providers (APPs -  Physician Assistants and Nurse Practitioners) who all work together to provide you with the care you need, when you need it.  We recommend signing up for the patient portal called "MyChart".  Sign up information is provided on this After Visit Summary.  MyChart is used to connect with patients for Virtual Visits (Telemedicine).  Patients are able to view lab/test results, encounter notes,  upcoming appointments, etc.  Non-urgent messages can be sent to your provider as well.   To learn more about what you can do with MyChart, go to [email or web address].    Your next appointment:   3 month(s)  Provider:   Evalina Field, MD    Time Spent with Patient: I have spent a total of 35 minutes with patient reviewing hospital notes, telemetry, EKGs, labs and examining the patient as well as establishing an assessment and plan that was discussed with the patient.  > 50% of time was spent in direct patient care.  Signed, Addison Naegeli. Audie Box, MD, [place]  [address], [place], [place] [address] [phone]  12/11/2022 11:19 AM

## 2022-12-11 ENCOUNTER — Ambulatory Visit: Payer: PPO | Attending: Cardiovascular Disease | Admitting: Cardiovascular Disease

## 2022-12-11 ENCOUNTER — Encounter: Payer: Self-pay | Admitting: Cardiovascular Disease

## 2022-12-11 ENCOUNTER — Telehealth: Payer: Self-pay | Admitting: Cardiovascular Disease

## 2022-12-11 VITALS — BP 126/70 | HR 84 | Ht 70.0 in | Wt 244.8 lb

## 2022-12-11 DIAGNOSIS — I2510 Atherosclerotic heart disease of native coronary artery without angina pectoris: Secondary | ICD-10-CM

## 2022-12-11 DIAGNOSIS — R931 Abnormal findings on diagnostic imaging of heart and coronary circulation: Secondary | ICD-10-CM | POA: Diagnosis not present

## 2022-12-11 DIAGNOSIS — E7800 Pure hypercholesterolemia, unspecified: Secondary | ICD-10-CM

## 2022-12-11 DIAGNOSIS — I10 Essential (primary) hypertension: Secondary | ICD-10-CM

## 2022-12-11 DIAGNOSIS — Z952 Presence of prosthetic heart valve: Secondary | ICD-10-CM

## 2022-12-11 LAB — SPECIMEN STATUS REPORT

## 2022-12-11 MED ORDER — SPIRONOLACTONE 25 MG PO TABS
25.0000 mg | ORAL_TABLET | Freq: Every day | ORAL | 2 refills | Status: DC
  Filled 2022-12-31 – 2023-03-03 (×2): qty 90, 90d supply, fill #0
  Filled 2023-05-29: qty 90, 90d supply, fill #1
  Filled 2023-08-26: qty 90, 90d supply, fill #2

## 2022-12-11 NOTE — Telephone Encounter (Signed)
Called pharmacy- advised that we completed blood work today and would continue monitor levels and would make changes as needed.   Thanks!

## 2022-12-11 NOTE — Patient Instructions (Signed)
Medication Instructions:  STOP HCTZ  START Aldactone 25 mg daily   *If you need a refill on your cardiac medications before your next appointment, please call your pharmacy*   Lab Work: CBC, CMET, TSH, LIPID, A1C today   If you have labs (blood work) drawn today and your tests are completely normal, you will receive your results only by: [place] (if you have MyChart) OR A paper copy in the mail If you have any lab test that is abnormal or we need to change your treatment, we will call you to review the results.   Follow-Up: At [facility], you and your health needs are our priority.  As part of our continuing mission to provide you with exceptional heart care, we have created designated Provider Care Teams.  These Care Teams include your primary Cardiologist (physician) and Advanced Practice Providers (APPs -  Physician Assistants and Nurse Practitioners) who all work together to provide you with the care you need, when you need it.  We recommend signing up for the patient portal called "MyChart".  Sign up information is provided on this After Visit Summary.  MyChart is used to connect with patients for Virtual Visits (Telemedicine).  Patients are able to view lab/test results, encounter notes, upcoming appointments, etc.  Non-urgent messages can be sent to your provider as well.   To learn more about what you can do with MyChart, go to [email or web address].    Your next appointment:   3 month(s)  Provider:   Evalina Field, MD

## 2022-12-11 NOTE — Telephone Encounter (Signed)
Pt c/o medication issue:  1. Name of Medication: spironolactone (ALDACTONE) 25 MG tablet   2. How are you currently taking this medication (dosage and times per day)?   3. Are you having a reaction (difficulty breathing--STAT)?   4. What is your medication issue? [place] is requesting call back to make sure patient's levels are being monitor due to other med interactions.

## 2022-12-16 ENCOUNTER — Ambulatory Visit (INDEPENDENT_AMBULATORY_CARE_PROVIDER_SITE_OTHER): Payer: PPO | Admitting: Physician Assistant

## 2022-12-16 ENCOUNTER — Encounter: Payer: Self-pay | Admitting: Physician Assistant

## 2022-12-16 VITALS — BP 130/80 | HR 64 | Temp 97.8°F | Ht 70.0 in | Wt 250.5 lb

## 2022-12-16 DIAGNOSIS — R2689 Other abnormalities of gait and mobility: Secondary | ICD-10-CM

## 2022-12-16 DIAGNOSIS — E039 Hypothyroidism, unspecified: Secondary | ICD-10-CM | POA: Diagnosis not present

## 2022-12-16 DIAGNOSIS — S51812A Laceration without foreign body of left forearm, initial encounter: Secondary | ICD-10-CM | POA: Diagnosis not present

## 2022-12-16 DIAGNOSIS — J321 Chronic frontal sinusitis: Secondary | ICD-10-CM | POA: Diagnosis not present

## 2022-12-16 LAB — COMPREHENSIVE METABOLIC PANEL
ALT: 22 IU/L (ref 0–44)
AST: 20 IU/L (ref 0–40)
Albumin/Globulin Ratio: 2.8 — ABNORMAL HIGH (ref 1.2–2.2)
Albumin: 4.4 g/dL (ref 3.8–4.8)
Alkaline Phosphatase: 63 IU/L (ref 44–121)
BUN/Creatinine Ratio: 21 (ref 10–24)
BUN: 18 mg/dL (ref 8–27)
Bilirubin Total: 0.3 mg/dL (ref 0.0–1.2)
CO2: 25 mmol/L (ref 20–29)
Calcium: 9.8 mg/dL (ref 8.6–10.2)
Chloride: 105 mmol/L (ref 96–106)
Creatinine, Ser: 0.84 mg/dL (ref 0.76–1.27)
Globulin, Total: 1.6 g/dL (ref 1.5–4.5)
Glucose: 108 mg/dL — ABNORMAL HIGH (ref 70–99)
Potassium: 4.7 mmol/L (ref 3.5–5.2)
Sodium: 145 mmol/L — ABNORMAL HIGH (ref 134–144)
Total Protein: 6 g/dL (ref 6.0–8.5)
eGFR: 88 mL/min/{1.73_m2} (ref >59)

## 2022-12-16 LAB — CBC
Hematocrit: 41 % (ref 37.5–51.0)
Hemoglobin: 13.4 g/dL (ref 13.0–17.7)
MCH: 27.2 pg (ref 26.6–33.0)
MCHC: 32.7 g/dL (ref 31.5–35.7)
MCV: 83 fL (ref 79–97)
Platelets: 226 10*3/uL (ref 150–450)
RBC: 4.93 x10E6/uL (ref 4.14–5.80)
RDW: 13.8 % (ref 11.6–15.4)
WBC: 7 10*3/uL (ref 3.4–10.8)

## 2022-12-16 LAB — LIPID PANEL
Chol/HDL Ratio: 3.3 ratio (ref 0.0–5.0)
Cholesterol, Total: 136 mg/dL (ref 100–199)
HDL: 41 mg/dL (ref >39)
LDL Chol Calc (NIH): 73 mg/dL (ref 0–99)
Triglycerides: 120 mg/dL (ref 0–149)
VLDL Cholesterol Cal: 22 mg/dL (ref 5–40)

## 2022-12-16 LAB — HEMOGLOBIN A1C
Est. average glucose Bld gHb Est-mCnc: 134 mg/dL
Hgb A1c MFr Bld: 6.3 % — ABNORMAL HIGH (ref 4.8–5.6)

## 2022-12-16 LAB — TSH: TSH: 0.378 u[IU]/mL — ABNORMAL LOW (ref 0.450–4.500)

## 2022-12-16 MED ORDER — DOXYCYCLINE HYCLATE 100 MG PO TABS: 100.0000 mg | ORAL_TABLET | Freq: Two times a day (BID) | ORAL | 0 refills | Status: DC

## 2022-12-16 MED ORDER — LEVOTHYROXINE SODIUM 125 MCG PO TABS
125.0000 ug | ORAL_TABLET | Freq: Every day | ORAL | 0 refills | Status: DC
  Filled 2022-12-31 – 2023-02-01 (×2): qty 30, 30d supply, fill #0

## 2022-12-16 NOTE — Patient Instructions (Signed)
It was great to see you!  I will place CT scan order for [facility] -- give them a call if you do not hear from them in the next few days [phone]  Keep up the great wound care  Decrease levothyroxine to 125 mcg daily We will need to recheck your TSH in 4-6 weeks -- this can be a lab only appointment  Your mole looks like a seborrheic keratosis -- does not look concerning to me  Physical therapy referral for balance issues printed -- please start this!  Take care,  Inda Coke PA-C

## 2022-12-16 NOTE — Progress Notes (Signed)
Mitchell Humphrey is a 82 y.o. male here for a new problem.  History of Present Illness:   Chief Complaint  Patient presents with   Sinus Problem    Pt says still having sinus issues from Sept. He is also having balance issues.    Hypothyroidism    Feels like his thyroid is low.    HPI  Fall Injury to Left Arm He had a fall on 12/15/22 that scraped up his left forearm.  He reports he was pulling the trash bin up from the curb and became imbalanced before falling into a brush pile with branches. He has used neosporin on the wounds. Denies fevers, chills, excessive bleeding.  Balance issues Feels unstable on his legs at times Denies dizziness, lightheadedness Wife is in poor health and he is having to do a lot more around the house Interested in PT  Sinus Problems He reports experiencing constant head congestion and runny nose recently. He is using a nasal spray at home. He has had overall consistent sinus issues for the past 2-3 years. Denies fevers during this or past sinus infections. Most recent treatment was on 08/14/22 - note reviewed  Hypothyroidism He is taking Synthroid 150 mcg daily before breakfast. He is concerned that his TSH is low. Lab Results  Component Value Date   TSH 0.378 (L) 12/11/2022   Mole He has an atypical mole that he wants looked on lower back Denies pain/bleeding    Past Medical History:  Diagnosis Date   Allergy    Arthritis    BPH (benign prostatic hyperplasia)    Cataract    DVT (deep venous thrombosis) ([place]) 12/17/2019   left gastrocnemius DVT in setting of COVID-19   Dyslipidemia    Erectile dysfunction    GERD (gastroesophageal reflux disease)    Hx of adenomatous colonic polyps    Hyperlipidemia    Hypertension    Hypogonadism male    Hypothyroid    Obesity    S/P TAVR (transcatheter aortic valve replacement) 05/26/2022   S3UR 59m vias TF approach with Dr. MAngelena Formand Dr. BCyndia Bent  Severe aortic stenosis      Social  History   Tobacco Use   Smoking status: Never   Smokeless tobacco: Never   Tobacco comments:    He chews on a cigar  Vaping Use   Vaping Use: Never used  Substance Use Topics   Alcohol use: No   Drug use: No    Past Surgical History:  Procedure Laterality Date   APPENDECTOMY     COLONOSCOPY  08/2008   Dr. MWatt Climes  HERNIA REPAIR  1970   [place]   INTRAOPERATIVE TRANSTHORACIC ECHOCARDIOGRAM N/A 05/26/2022   Procedure: INTRAOPERATIVE TRANSTHORACIC ECHOCARDIOGRAM;  Surgeon: MBurnell Blanks MD;  [facility];  Service: Open Heart Surgery;  Laterality: N/A;   RIGHT HEART CATH AND CORONARY ANGIOGRAPHY N/A 03/11/2022   Procedure: RIGHT HEART CATH AND CORONARY ANGIOGRAPHY;  Surgeon: MBurnell Blanks MD;  [facility];  Service: Cardiovascular;  Laterality: N/A;   TRANSCATHETER AORTIC VALVE REPLACEMENT, TRANSFEMORAL N/A 05/26/2022   Procedure: Transcatheter Aortic Valve Replacement, Transfemoral;  Surgeon: MBurnell Blanks MD;  [facility];  Service: Open Heart Surgery;  Laterality: N/A;    Family History  Problem Relation Age of Onset   Breast cancer Mother    Lung cancer Father    Brain cancer Father    Breast cancer Sister  Heart attack Sister     Allergies  Allergen Reactions   Penicillins Rash    Current Medications:   Current Outpatient Medications:    acetaminophen (TYLENOL) 500 MG tablet, Take 1,000 mg by mouth every 6 (six) hours as needed for moderate pain., Disp: , Rfl:    acetaminophen-codeine (TYLENOL #3) 300-30 MG tablet, Take 1 tablet by mouth every 6 (six) hours as needed for moderate pain., Disp: 30 tablet, Rfl: 0   aspirin EC 81 MG tablet, Take 81 mg by mouth daily. Swallow whole., Disp: , Rfl:    atorvastatin (LIPITOR) 20 MG tablet, TAKE 1 TABLET (20 MG TOTAL) BY MOUTH DAILY., Disp: 90 tablet, Rfl: 1   cetirizine (ZYRTEC) 10 MG tablet, Take 10 mg by mouth daily., Disp: , Rfl:     Cholecalciferol (VITAMIN D3) 125 MCG (5000 UT) CAPS, Take 5,000 Units by mouth daily., Disp: , Rfl:    Cyanocobalamin (VITAMIN B 12 PO), Take by mouth., Disp: , Rfl:    diclofenac Sodium (VOLTAREN) 1 % GEL, Apply 2 g topically daily as needed (pain)., Disp: , Rfl:    ELDERBERRY PO, Take 1 capsule by mouth daily., Disp: , Rfl:    enalapril (VASOTEC) 20 MG tablet, Take 1 tablet (20 mg total) by mouth daily., Disp: 90 tablet, Rfl: 3   finasteride (PROSCAR) 5 MG tablet, TAKE 1 TABLET (5 MG TOTAL) BY MOUTH DAILY., Disp: 90 tablet, Rfl: 1   ipratropium (ATROVENT) 0.06 % nasal spray, Place 2 sprays into both nostrils 4 (four) times daily., Disp: 15 mL, Rfl: 12   ketorolac (ACULAR) 0.5 % ophthalmic solution, SMARTSIG:1 Drop(s) In Eye(s) Daily PRN, Disp: , Rfl:    levothyroxine (SYNTHROID) 150 MCG tablet, TAKE 1 TABLET EVERY MORNING PRIOR TO BREAKFAST, Disp: 90 tablet, Rfl: 1   loratadine (CLARITIN) 10 MG tablet, Take 10 mg by mouth daily., Disp: , Rfl:    meloxicam (MOBIC) 15 MG tablet, TAKE 1 TABLET (15 MG TOTAL) BY MOUTH DAILY., Disp: 90 tablet, Rfl: 1   Multiple Vitamins-Minerals (IMMUNE SUPPORT PO), Take 1 tablet by mouth daily. NHR Immune Protect, Disp: , Rfl:    oxybutynin (DITROPAN-XL) 5 MG 24 hr tablet, Take 5 mg by mouth daily., Disp: , Rfl:    Polyethyl Glycol-Propyl Glycol (SYSTANE OP), Place 1 drop into both eyes daily as needed (irritation)., Disp: , Rfl:    spironolactone (ALDACTONE) 25 MG tablet, Take 1 tablet (25 mg total) by mouth daily., Disp: 90 tablet, Rfl: 3   tamsulosin (FLOMAX) 0.4 MG CAPS capsule, TAKE 1 CAPSULE (0.4 MG TOTAL) BY MOUTH DAILY., Disp: 90 capsule, Rfl: 1   Review of Systems:   Review of Systems  Constitutional:  Negative for fever and malaise/fatigue.  HENT:  Positive for congestion (Head).        (+) Runny nose  Eyes:  Negative for blurred vision.  Respiratory:  Positive for cough (Intermittent). Negative for shortness of breath.   Cardiovascular:  Negative for  chest pain, palpitations and leg swelling.  Gastrointestinal:  Negative for vomiting.  Musculoskeletal:  Negative for back pain.  Skin:  Negative for rash.  Neurological:  Positive for headaches (Intermittent). Negative for loss of consciousness.    Vitals:   Vitals:   12/16/22 1525  BP: 130/80  Pulse: 64  Temp: 97.8 F (36.6 C)  TempSrc: Temporal  SpO2: 96%  Weight: 250 lb 8 oz (113.6 kg)  Height: '5\' 10"'$  (1.778 m)     Body mass index is 35.94 kg/m.  Physical Exam:   Physical Exam Vitals and nursing note reviewed.  Constitutional:      General: He is not in acute distress.    Appearance: He is well-developed. He is not ill-appearing or toxic-appearing.  HENT:     Head: Normocephalic and atraumatic.     Right Ear: Tympanic membrane, ear canal and external ear normal. Tympanic membrane is not erythematous, retracted or bulging.     Left Ear: Tympanic membrane, ear canal and external ear normal. Tympanic membrane is not erythematous, retracted or bulging.     Nose:     Right Sinus: Frontal sinus tenderness present. No maxillary sinus tenderness.     Left Sinus: Frontal sinus tenderness present. No maxillary sinus tenderness.     Mouth/Throat:     Pharynx: Uvula midline. No posterior oropharyngeal erythema.  Eyes:     General: Lids are normal.     Conjunctiva/sclera: Conjunctivae normal.  Neck:     Trachea: Trachea normal.  Cardiovascular:     Rate and Rhythm: Normal rate and regular rhythm.     Heart sounds: Normal heart sounds, S1 normal and S2 normal.  Pulmonary:     Effort: Pulmonary effort is normal.     Breath sounds: Normal breath sounds. No decreased breath sounds, wheezing, rhonchi or rales.  Musculoskeletal:     Comments: Well approximated skin tear and multiple tiny lacerations to L forearm without any obvious discharge, surrounding erythema, TTP  Lymphadenopathy:     Cervical: No cervical adenopathy.  Skin:    General: Skin is warm and dry.   Neurological:     Mental Status: He is alert.  Psychiatric:        Speech: Speech normal.        Behavior: Behavior normal. Behavior is cooperative.     Assessment and Plan:   Balance problem No red flags PT order placed If no improvement with PT, recommend close follow-up to pursue additional work-up  Skin tear of left forearm without complication, initial encounter Healing well We re-dressed his wounds today Continue excellent wound care  Chronic frontal sinusitis Start oral doxycyline Given chronicity, will order CT of sinus If any concerns, low threshold to refer to ENT  Hypothyroidism (acquired) Over-controlled Decrease levothyroxine to 125 mcg daily Recommend TSH recheck in 4-6 weeks  I,Alexander Ruley,acting as a scribe for [employer], PA.,have documented all relevant documentation on the behalf of Inda Coke, PA,as directed by  Inda Coke, PA while in the presence of Inda Coke, [place].  I, Inda Coke, [place], have reviewed all documentation for this visit. The documentation on 12/16/22 for the exam, diagnosis, procedures, and orders are all accurate and complete.  Inda Coke, PA-C

## 2022-12-19 ENCOUNTER — Other Ambulatory Visit (HOSPITAL_COMMUNITY): Payer: Self-pay

## 2022-12-30 DIAGNOSIS — M5431 Sciatica, right side: Secondary | ICD-10-CM | POA: Diagnosis not present

## 2022-12-30 DIAGNOSIS — M25551 Pain in right hip: Secondary | ICD-10-CM | POA: Diagnosis not present

## 2022-12-30 DIAGNOSIS — R293 Abnormal posture: Secondary | ICD-10-CM | POA: Diagnosis not present

## 2022-12-30 DIAGNOSIS — R262 Difficulty in walking, not elsewhere classified: Secondary | ICD-10-CM | POA: Diagnosis not present

## 2022-12-30 DIAGNOSIS — M25651 Stiffness of right hip, not elsewhere classified: Secondary | ICD-10-CM | POA: Diagnosis not present

## 2022-12-31 ENCOUNTER — Other Ambulatory Visit (HOSPITAL_COMMUNITY): Payer: Self-pay

## 2022-12-31 MED ORDER — OXYBUTYNIN CHLORIDE ER 5 MG PO TB24
5.0000 mg | ORAL_TABLET | Freq: Every day | ORAL | 11 refills | Status: DC
  Filled 2022-12-31: qty 30, 30d supply, fill #0
  Filled 2023-02-01: qty 30, 30d supply, fill #1
  Filled 2023-03-03: qty 90, 90d supply, fill #2
  Filled 2023-05-29: qty 90, 90d supply, fill #3
  Filled 2023-08-26: qty 30, 30d supply, fill #4

## 2023-01-05 ENCOUNTER — Ambulatory Visit
Admission: RE | Admit: 2023-01-05 | Discharge: 2023-01-05 | Disposition: A | Discharge status: Home / Self Care | Payer: HMO | Source: Ambulatory Visit | Attending: Physician Assistant | Admitting: Physician Assistant

## 2023-01-05 DIAGNOSIS — J321 Chronic frontal sinusitis: Secondary | ICD-10-CM

## 2023-01-05 DIAGNOSIS — J329 Chronic sinusitis, unspecified: Secondary | ICD-10-CM | POA: Diagnosis not present

## 2023-01-06 ENCOUNTER — Other Ambulatory Visit: Payer: Self-pay | Admitting: Physician Assistant

## 2023-01-06 DIAGNOSIS — R262 Difficulty in walking, not elsewhere classified: Secondary | ICD-10-CM | POA: Diagnosis not present

## 2023-01-06 DIAGNOSIS — M5431 Sciatica, right side: Secondary | ICD-10-CM | POA: Diagnosis not present

## 2023-01-06 DIAGNOSIS — M25551 Pain in right hip: Secondary | ICD-10-CM | POA: Diagnosis not present

## 2023-01-06 DIAGNOSIS — J321 Chronic frontal sinusitis: Secondary | ICD-10-CM

## 2023-01-06 DIAGNOSIS — M25651 Stiffness of right hip, not elsewhere classified: Secondary | ICD-10-CM | POA: Diagnosis not present

## 2023-01-06 DIAGNOSIS — R293 Abnormal posture: Secondary | ICD-10-CM | POA: Diagnosis not present

## 2023-01-08 DIAGNOSIS — R293 Abnormal posture: Secondary | ICD-10-CM | POA: Diagnosis not present

## 2023-01-08 DIAGNOSIS — M5431 Sciatica, right side: Secondary | ICD-10-CM | POA: Diagnosis not present

## 2023-01-08 DIAGNOSIS — M25551 Pain in right hip: Secondary | ICD-10-CM | POA: Diagnosis not present

## 2023-01-08 DIAGNOSIS — R262 Difficulty in walking, not elsewhere classified: Secondary | ICD-10-CM | POA: Diagnosis not present

## 2023-01-08 DIAGNOSIS — M25651 Stiffness of right hip, not elsewhere classified: Secondary | ICD-10-CM | POA: Diagnosis not present

## 2023-01-11 DIAGNOSIS — M5431 Sciatica, right side: Secondary | ICD-10-CM | POA: Diagnosis not present

## 2023-01-11 DIAGNOSIS — M25551 Pain in right hip: Secondary | ICD-10-CM | POA: Diagnosis not present

## 2023-01-11 DIAGNOSIS — M25651 Stiffness of right hip, not elsewhere classified: Secondary | ICD-10-CM | POA: Diagnosis not present

## 2023-01-11 DIAGNOSIS — R262 Difficulty in walking, not elsewhere classified: Secondary | ICD-10-CM | POA: Diagnosis not present

## 2023-01-11 DIAGNOSIS — R293 Abnormal posture: Secondary | ICD-10-CM | POA: Diagnosis not present

## 2023-01-14 DIAGNOSIS — M5431 Sciatica, right side: Secondary | ICD-10-CM | POA: Diagnosis not present

## 2023-01-14 DIAGNOSIS — R293 Abnormal posture: Secondary | ICD-10-CM | POA: Diagnosis not present

## 2023-01-14 DIAGNOSIS — R262 Difficulty in walking, not elsewhere classified: Secondary | ICD-10-CM | POA: Diagnosis not present

## 2023-01-14 DIAGNOSIS — M25651 Stiffness of right hip, not elsewhere classified: Secondary | ICD-10-CM | POA: Diagnosis not present

## 2023-01-14 DIAGNOSIS — M25551 Pain in right hip: Secondary | ICD-10-CM | POA: Diagnosis not present

## 2023-01-19 ENCOUNTER — Other Ambulatory Visit (INDEPENDENT_AMBULATORY_CARE_PROVIDER_SITE_OTHER): Payer: HMO

## 2023-01-19 DIAGNOSIS — E039 Hypothyroidism, unspecified: Secondary | ICD-10-CM | POA: Diagnosis not present

## 2023-01-19 LAB — TSH: TSH: 2.21 u[IU]/mL (ref 0.35–5.50)

## 2023-01-20 DIAGNOSIS — M25651 Stiffness of right hip, not elsewhere classified: Secondary | ICD-10-CM | POA: Diagnosis not present

## 2023-01-20 DIAGNOSIS — M5431 Sciatica, right side: Secondary | ICD-10-CM | POA: Diagnosis not present

## 2023-01-20 DIAGNOSIS — R262 Difficulty in walking, not elsewhere classified: Secondary | ICD-10-CM | POA: Diagnosis not present

## 2023-01-20 DIAGNOSIS — R293 Abnormal posture: Secondary | ICD-10-CM | POA: Diagnosis not present

## 2023-01-20 DIAGNOSIS — M25551 Pain in right hip: Secondary | ICD-10-CM | POA: Diagnosis not present

## 2023-01-25 DIAGNOSIS — R262 Difficulty in walking, not elsewhere classified: Secondary | ICD-10-CM | POA: Diagnosis not present

## 2023-01-25 DIAGNOSIS — M5431 Sciatica, right side: Secondary | ICD-10-CM | POA: Diagnosis not present

## 2023-01-25 DIAGNOSIS — R293 Abnormal posture: Secondary | ICD-10-CM | POA: Diagnosis not present

## 2023-01-25 DIAGNOSIS — M25651 Stiffness of right hip, not elsewhere classified: Secondary | ICD-10-CM | POA: Diagnosis not present

## 2023-01-25 DIAGNOSIS — M25551 Pain in right hip: Secondary | ICD-10-CM | POA: Diagnosis not present

## 2023-01-29 ENCOUNTER — Telehealth: Payer: Self-pay | Admitting: Cardiovascular Disease

## 2023-01-29 NOTE — Telephone Encounter (Signed)
Pt c/o BP issue: STAT if pt c/o blurred vision, one-sided weakness or slurred speech  1. What are your last 5 BP readings?  3/05: 125/74 76 after lunch          154/86 87 at 11:54 PM   3/06: 129/76 69            147/80 86 11:13 pm  3/07: 138/81 65 AM          159/87 84 PM  3/08: 133/78 77 AM          152/81 83 PM  3/09: 148/79 61 AM            3/10: 169/86 88 right after midnight          140/76 70 AM          168/93 69 PM   3/11: 149/81 66 AM          17689 77 PM  3/12: 151/83 64 AM          172/86 83 PM  3/13: 148/83 66 AM          154/82 88 PM  3/14: 137/77 71 AM          164/91 75 PM  3/15: 157/87 68 AM   2. Are you having any other symptoms (ex. Dizziness, headache, blurred vision, passed out)?  Dizziness/unbalanced--patient assumes this is due to sinus issues   3. What is your BP issue?   Patient states his BP has been fluctuating-elevated.

## 2023-02-01 ENCOUNTER — Other Ambulatory Visit (HOSPITAL_COMMUNITY): Payer: Self-pay

## 2023-02-01 MED ORDER — ENALAPRIL MALEATE 20 MG PO TABS
40.0000 mg | ORAL_TABLET | Freq: Every day | ORAL | 3 refills | Status: DC
  Filled 2023-02-01: qty 180, 90d supply, fill #0

## 2023-02-01 NOTE — Telephone Encounter (Signed)
Increase enalapril to 40 mg daily.  [place] T. Audie Box, MD, [facility] [address], [place], [place] [address] [phone] 9:55 AM   Called pt to give message. Sent in prescription to preferred pharmacy. He plans to continue logging BP daily- told to check 1-2 hrs after taking meds. He verbalized understanding. Told to call with any questions, concerns, symptoms.

## 2023-02-01 NOTE — Telephone Encounter (Signed)
First called pt's spouse, she answered and asked if I could call the patient's cell phone. Called his cell phone and no answer, mailbox full.   Called spouse back- told her that pt's mailbox is full and asked her to give the patient a message to call [facility].

## 2023-02-01 NOTE — Addendum Note (Signed)
Addended by: Sharee Holster on: 02/01/2023 12:23 PM   Modules accepted: Orders

## 2023-02-01 NOTE — Telephone Encounter (Signed)
Pt reports that BP over the weekend- 170's over 90's; see previous message for BP's for last week  Takes BP meds in morning and checks right afterwards; told pt that he should wait about 1-2hours and take BP after sitting for 5-10 minutes. I asked him about salt intake; he says that he is very mindful of salt intake.   Pt feels like balance is off, when he bends over/some dizziness- has been taking meds and antibiotics for sinus infection- had a CT that showed infection in sinus' and has had several rounds of meds/antibiotics due to these issues. Has appt in April with ENT.  Pt was wondering if any of his BP/ medications need to be changed or if he needs to be seen. Told him that I would forward this to his provider and we would get back to him.

## 2023-02-02 ENCOUNTER — Other Ambulatory Visit (HOSPITAL_COMMUNITY): Payer: Self-pay

## 2023-02-02 DIAGNOSIS — Z961 Presence of intraocular lens: Secondary | ICD-10-CM | POA: Diagnosis not present

## 2023-02-02 DIAGNOSIS — H35371 Puckering of macula, right eye: Secondary | ICD-10-CM | POA: Diagnosis not present

## 2023-02-02 DIAGNOSIS — H35362 Drusen (degenerative) of macula, left eye: Secondary | ICD-10-CM | POA: Diagnosis not present

## 2023-02-02 DIAGNOSIS — H26491 Other secondary cataract, right eye: Secondary | ICD-10-CM | POA: Diagnosis not present

## 2023-02-02 DIAGNOSIS — H43392 Other vitreous opacities, left eye: Secondary | ICD-10-CM | POA: Diagnosis not present

## 2023-02-02 DIAGNOSIS — H04123 Dry eye syndrome of bilateral lacrimal glands: Secondary | ICD-10-CM | POA: Diagnosis not present

## 2023-02-08 DIAGNOSIS — R262 Difficulty in walking, not elsewhere classified: Secondary | ICD-10-CM | POA: Diagnosis not present

## 2023-02-08 DIAGNOSIS — M25551 Pain in right hip: Secondary | ICD-10-CM | POA: Diagnosis not present

## 2023-02-08 DIAGNOSIS — M25651 Stiffness of right hip, not elsewhere classified: Secondary | ICD-10-CM | POA: Diagnosis not present

## 2023-02-08 DIAGNOSIS — M5431 Sciatica, right side: Secondary | ICD-10-CM | POA: Diagnosis not present

## 2023-02-08 DIAGNOSIS — R293 Abnormal posture: Secondary | ICD-10-CM | POA: Diagnosis not present

## 2023-02-15 DIAGNOSIS — M25651 Stiffness of right hip, not elsewhere classified: Secondary | ICD-10-CM | POA: Diagnosis not present

## 2023-02-15 DIAGNOSIS — M5431 Sciatica, right side: Secondary | ICD-10-CM | POA: Diagnosis not present

## 2023-02-15 DIAGNOSIS — R293 Abnormal posture: Secondary | ICD-10-CM | POA: Diagnosis not present

## 2023-02-15 DIAGNOSIS — M25551 Pain in right hip: Secondary | ICD-10-CM | POA: Diagnosis not present

## 2023-02-15 DIAGNOSIS — R262 Difficulty in walking, not elsewhere classified: Secondary | ICD-10-CM | POA: Diagnosis not present

## 2023-02-17 DIAGNOSIS — M25551 Pain in right hip: Secondary | ICD-10-CM | POA: Diagnosis not present

## 2023-02-17 DIAGNOSIS — R293 Abnormal posture: Secondary | ICD-10-CM | POA: Diagnosis not present

## 2023-02-17 DIAGNOSIS — M25651 Stiffness of right hip, not elsewhere classified: Secondary | ICD-10-CM | POA: Diagnosis not present

## 2023-02-17 DIAGNOSIS — R262 Difficulty in walking, not elsewhere classified: Secondary | ICD-10-CM | POA: Diagnosis not present

## 2023-02-17 DIAGNOSIS — M5431 Sciatica, right side: Secondary | ICD-10-CM | POA: Diagnosis not present

## 2023-02-22 DIAGNOSIS — R262 Difficulty in walking, not elsewhere classified: Secondary | ICD-10-CM | POA: Diagnosis not present

## 2023-02-22 DIAGNOSIS — R293 Abnormal posture: Secondary | ICD-10-CM | POA: Diagnosis not present

## 2023-02-22 DIAGNOSIS — M5431 Sciatica, right side: Secondary | ICD-10-CM | POA: Diagnosis not present

## 2023-02-22 DIAGNOSIS — M25551 Pain in right hip: Secondary | ICD-10-CM | POA: Diagnosis not present

## 2023-02-22 DIAGNOSIS — M25651 Stiffness of right hip, not elsewhere classified: Secondary | ICD-10-CM | POA: Diagnosis not present

## 2023-02-23 ENCOUNTER — Other Ambulatory Visit (HOSPITAL_COMMUNITY): Payer: Self-pay

## 2023-02-23 DIAGNOSIS — H6123 Impacted cerumen, bilateral: Secondary | ICD-10-CM | POA: Diagnosis not present

## 2023-02-23 DIAGNOSIS — Z974 Presence of external hearing-aid: Secondary | ICD-10-CM | POA: Diagnosis not present

## 2023-02-23 DIAGNOSIS — J321 Chronic frontal sinusitis: Secondary | ICD-10-CM | POA: Diagnosis not present

## 2023-02-23 MED ORDER — CLINDAMYCIN HCL 300 MG PO CAPS
300.0000 mg | ORAL_CAPSULE | Freq: Three times a day (TID) | ORAL | 0 refills | Status: DC
  Filled 2023-02-23: qty 42, 14d supply, fill #0

## 2023-02-24 DIAGNOSIS — M25551 Pain in right hip: Secondary | ICD-10-CM | POA: Diagnosis not present

## 2023-02-24 DIAGNOSIS — M25651 Stiffness of right hip, not elsewhere classified: Secondary | ICD-10-CM | POA: Diagnosis not present

## 2023-02-24 DIAGNOSIS — R262 Difficulty in walking, not elsewhere classified: Secondary | ICD-10-CM | POA: Diagnosis not present

## 2023-02-24 DIAGNOSIS — R293 Abnormal posture: Secondary | ICD-10-CM | POA: Diagnosis not present

## 2023-02-24 DIAGNOSIS — M5431 Sciatica, right side: Secondary | ICD-10-CM | POA: Diagnosis not present

## 2023-02-27 IMAGING — DX DG WRIST COMPLETE 3+V*L*
4 series · 4 of 4 positions shown · non-contrast
Comparison: None.

CLINICAL DATA: L wrist pain, no inciting event no evidence

EXAM:
LEFT WRIST - COMPLETE 3+ VIEW

[wrist pa]
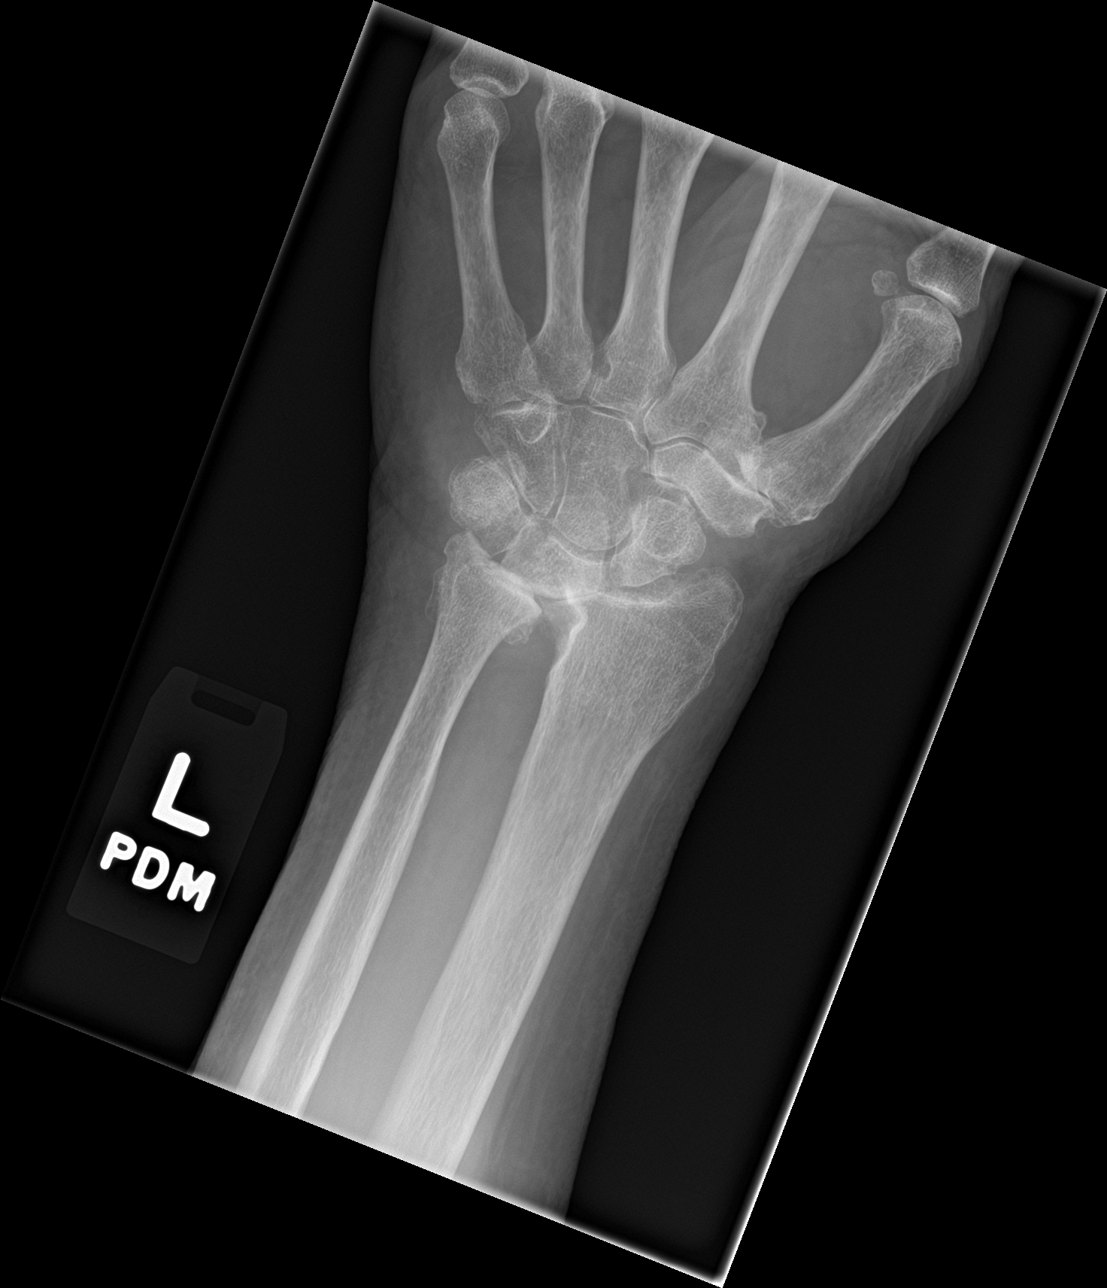

[wrist obl]
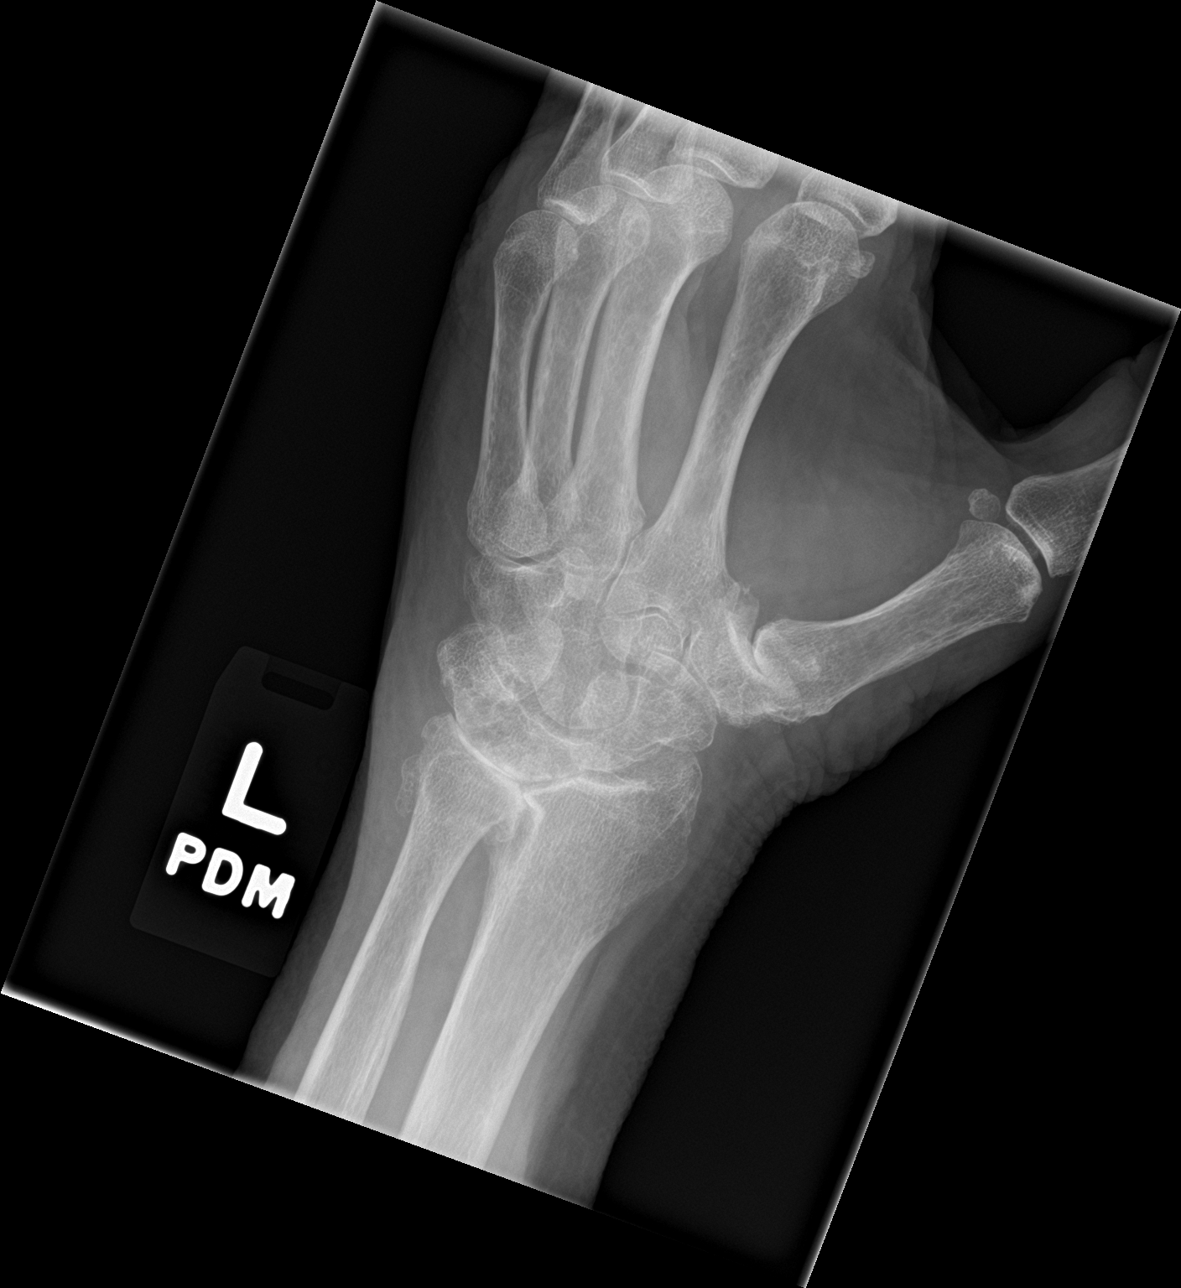

[wrist lat]
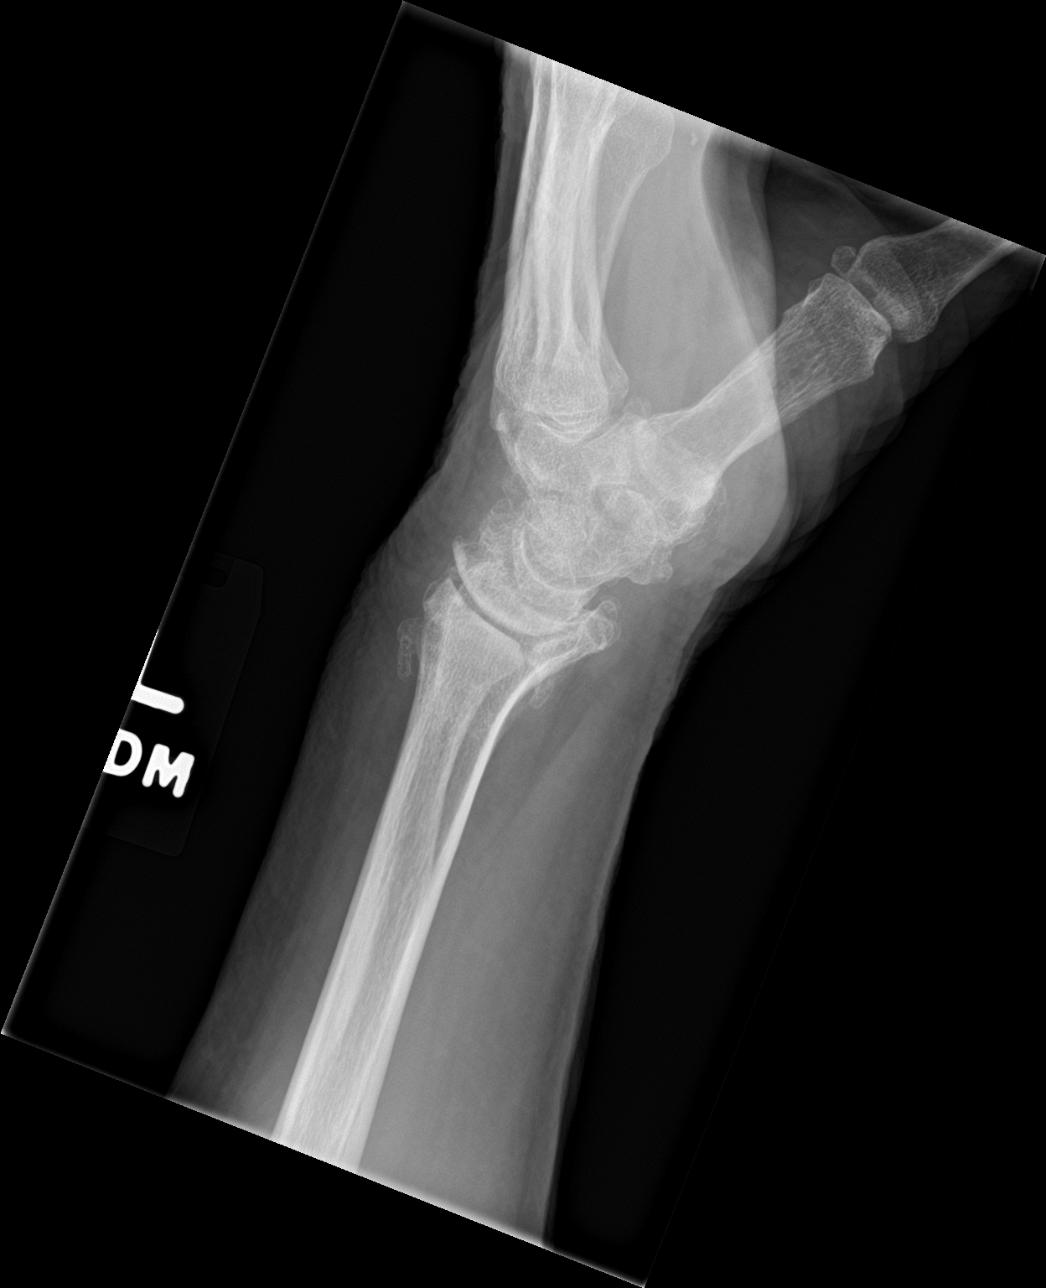

[navicular]
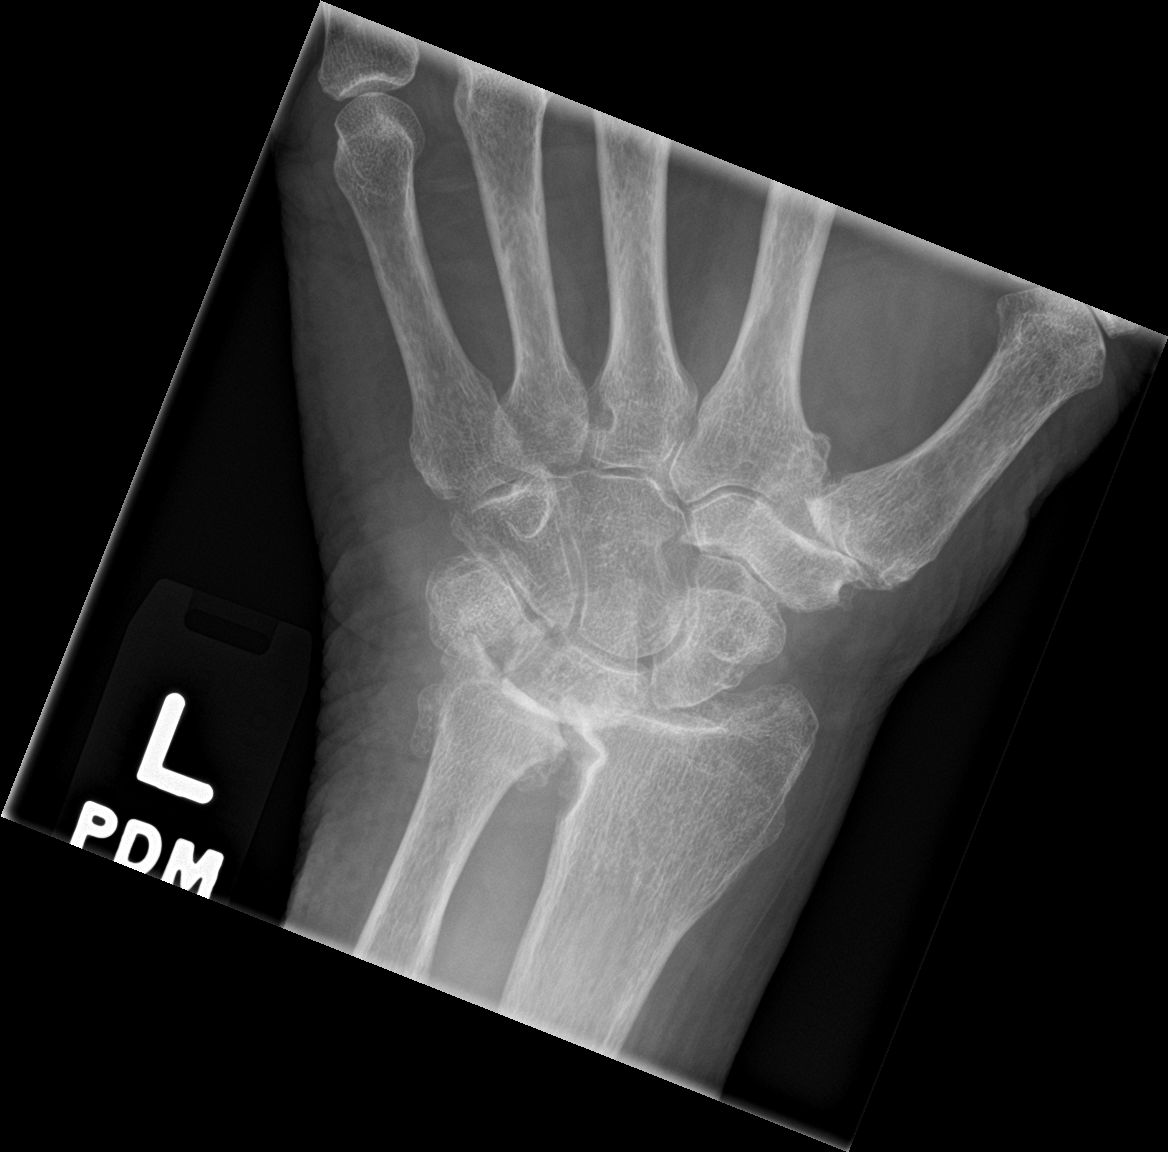

[4 of 4 positions shown; findings below may reference images not displayed]

FINDINGS: No evidence of acute fracture. No joint dislocation. Severe
degenerative change at the wrist with severe narrowing of the
radiocarpal joint and distal radial and ulnar degenerative
remodeling. Moderate triscaphe and first CMC degenerative change.
IMPRESSION: 1. Severe degenerative change at the wrist with severe narrowing of
the radiocarpal joint and distal radial and ulnar degenerative
remodeling.
2. Moderate triscaphe and first CMC degenerative change.
3. No evidence of acute fracture or joint dislocation.

## 2023-03-01 DIAGNOSIS — R293 Abnormal posture: Secondary | ICD-10-CM | POA: Diagnosis not present

## 2023-03-01 DIAGNOSIS — R262 Difficulty in walking, not elsewhere classified: Secondary | ICD-10-CM | POA: Diagnosis not present

## 2023-03-01 DIAGNOSIS — M25651 Stiffness of right hip, not elsewhere classified: Secondary | ICD-10-CM | POA: Diagnosis not present

## 2023-03-01 DIAGNOSIS — M25551 Pain in right hip: Secondary | ICD-10-CM | POA: Diagnosis not present

## 2023-03-01 DIAGNOSIS — M5431 Sciatica, right side: Secondary | ICD-10-CM | POA: Diagnosis not present

## 2023-03-03 ENCOUNTER — Other Ambulatory Visit: Payer: Self-pay | Admitting: Physician Assistant

## 2023-03-03 ENCOUNTER — Other Ambulatory Visit (HOSPITAL_COMMUNITY): Payer: Self-pay

## 2023-03-03 MED ORDER — LEVOTHYROXINE SODIUM 125 MCG PO TABS
125.0000 ug | ORAL_TABLET | Freq: Every day | ORAL | 5 refills | Status: DC
  Filled 2023-03-03: qty 30, 30d supply, fill #0
  Filled 2023-04-05: qty 30, 30d supply, fill #1
  Filled 2023-05-05: qty 30, 30d supply, fill #2
  Filled 2023-05-29: qty 30, 30d supply, fill #3
  Filled 2023-07-05: qty 30, 30d supply, fill #4
  Filled 2023-08-07: qty 30, 30d supply, fill #5

## 2023-03-15 DIAGNOSIS — R262 Difficulty in walking, not elsewhere classified: Secondary | ICD-10-CM | POA: Diagnosis not present

## 2023-03-15 DIAGNOSIS — R293 Abnormal posture: Secondary | ICD-10-CM | POA: Diagnosis not present

## 2023-03-15 DIAGNOSIS — M5431 Sciatica, right side: Secondary | ICD-10-CM | POA: Diagnosis not present

## 2023-03-15 DIAGNOSIS — M25651 Stiffness of right hip, not elsewhere classified: Secondary | ICD-10-CM | POA: Diagnosis not present

## 2023-03-15 DIAGNOSIS — M25551 Pain in right hip: Secondary | ICD-10-CM | POA: Diagnosis not present

## 2023-03-15 NOTE — Progress Notes (Unsigned)
Cardiology Office Note:   Date:  03/16/2023  NAME:  Mitchell Humphrey    MRN: [id number] DOB:  1941-04-09   PCP:  Jarold Motto, PA  Cardiologist:  Reatha Harps, MD  Electrophysiologist:  None   Referring MD: Jarold Motto, PA   Chief Complaint  Patient presents with   Follow-up        History of Present Illness:   Mitchell Humphrey is a 82 y.o. male with a hx of CAD, TAVR, DVT, PAD who presents for follow-up.  Blood pressure remains elevated.  Values routinely 150-160.  Denies any chest pain or trouble breathing.  Doing walking.  No issues.  He will have his yearly ultrasound done given recent TAVR.  No murmur on exam.  Lipids close to goal.  On aspirin.  Did have recent sinus infection.  Could explain recent fluctuations of blood pressure.  Overall seems to be doing well.  Just need to get his blood pressure better controlled.  Problem List 1. DVT -L gastrocnemius vein 12/17/2019 -in setting of Covid PNA 2. 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 HTN 4. HLD -T chol 136, HDL 41, LDL 73, TG 120 5.  CAD -calcium score 1599 (81st percentile) 6. DM -A1c 6.3 7. Non-obstructive CAD 8. 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 Clifton James and Dr. Laneta Simmers   Severe aortic stenosis     Past Surgical History: Past Surgical History:  Procedure Laterality Date   APPENDECTOMY      COLONOSCOPY  08/2008   Dr. Ewing Schlein   HERNIA REPAIR  1970   [facility]   INTRAOPERATIVE TRANSTHORACIC ECHOCARDIOGRAM N/A 05/26/2022   Procedure: INTRAOPERATIVE TRANSTHORACIC ECHOCARDIOGRAM;  Surgeon: Kathleene Hazel, MD;  Location: [facility];  Service: Open Heart Surgery;  Laterality: N/A;   RIGHT HEART CATH AND CORONARY ANGIOGRAPHY N/A 03/11/2022   Procedure: RIGHT HEART CATH AND CORONARY ANGIOGRAPHY;  Surgeon: Kathleene Hazel, MD;  Location: [facility];  Service: Cardiovascular;  Laterality: N/A;   TRANSCATHETER AORTIC VALVE REPLACEMENT, TRANSFEMORAL N/A 05/26/2022   Procedure: Transcatheter Aortic Valve Replacement, Transfemoral;  Surgeon: Kathleene Hazel, MD;  Location: [facility];  Service: Open Heart Surgery;  Laterality: N/A;    Current Medications: Current Meds  Medication Sig   aspirin EC 81 MG tablet Take 81 mg by mouth daily. Swallow whole.   atorvastatin (LIPITOR) 20 MG tablet TAKE 1 TABLET (20 MG TOTAL) BY MOUTH DAILY.   cetirizine (ZYRTEC) 10 MG tablet Take 10 mg by mouth daily.   Cholecalciferol (VITAMIN D3) 125 MCG (5000 UT) CAPS Take 5,000 Units by mouth daily.   Cyanocobalamin (VITAMIN B 12 PO) Take 1 capsule by mouth daily in the afternoon.   diclofenac Sodium (VOLTAREN) 1 % GEL Apply 2 g topically daily as needed (  pain).   ELDERBERRY PO Take 1 capsule by mouth daily.   finasteride (PROSCAR) 5 MG tablet TAKE 1 TABLET (5 MG TOTAL) BY MOUTH DAILY.   ipratropium (ATROVENT) 0.06 % nasal spray Place 2 sprays into both nostrils 4 (four) times daily.   levothyroxine (SYNTHROID) 125 MCG tablet Take 1 tablet (125 mcg total) by mouth daily.   loratadine (CLARITIN) 10 MG tablet Take 10 mg by mouth daily.   losartan (COZAAR) 50 MG tablet Take 1 tablet (50 mg total) by mouth 2 (two) times daily.   meloxicam (MOBIC) 15 MG tablet TAKE 1 TABLET (15 MG TOTAL) BY MOUTH DAILY.   Multiple Vitamins-Minerals (IMMUNE SUPPORT PO) Take 1 tablet by mouth  daily. NHR Immune Protect   oxybutynin (DITROPAN-XL) 5 MG 24 hr tablet Take 5 mg by mouth daily.   Polyethyl Glycol-Propyl Glycol (SYSTANE OP) Place 1 drop into both eyes daily as needed (irritation).   spironolactone (ALDACTONE) 25 MG tablet Take 1 tablet (25 mg total) by mouth daily.   tamsulosin (FLOMAX) 0.4 MG CAPS capsule TAKE 1 CAPSULE (0.4 MG TOTAL) BY MOUTH DAILY.   [DISCONTINUED] enalapril (VASOTEC) 20 MG tablet Take 2 tablets (40 mg total) by mouth daily.     Allergies:    Penicillins   Social History: Social History   Socioeconomic History   Marital status: Married    Spouse name: Not on file   Number of children: 3   Years of education: Not on file   Highest education level: Not on file  Occupational History   Occupation: Administrator, Civil Service   Occupation: Retired-Construction  Tobacco Use   Smoking status: Never   Smokeless tobacco: Never   Tobacco comments:    He chews on a cigar  Vaping Use   Vaping Use: Never used  Substance and Sexual Activity   Alcohol use: No   Drug use: No   Sexual activity: Not Currently  Other Topics Concern   Not on file  Social History Narrative   Superintendent for construction site   Lives with wife   2 step children   2 sons   Social Determinants of Health   Financial Resource Strain: Low Risk  (10/20/2022)   Overall Financial Resource Strain (CARDIA)    Difficulty of Paying Living Expenses: Not hard at all  Food Insecurity: No Food Insecurity (10/20/2022)   Hunger Vital Sign    Worried About Running Out of Food in the Last Year: Never true    Ran Out of Food in the Last Year: Never true  Transportation Needs: No Transportation Needs (10/20/2022)   PRAPARE - Administrator, Civil Service (Medical): No    Lack of Transportation (Non-Medical): No  Physical Activity: Sufficiently Active (10/20/2022)   Exercise Vital Sign    Days of Exercise per Week: 7 days    Minutes of Exercise per Session: 30 min  Stress:  No Stress Concern Present (10/20/2022)   [employer] of Occupational Health - Occupational Stress Questionnaire    Feeling of Stress : Not at all  Social Connections: Moderately Isolated (10/20/2022)   Social Connection and Isolation Panel [NHANES]    Frequency of Communication with Friends and Family: More than three times a week    Frequency of Social Gatherings with Friends and Family: More than three times a week    Attends Religious Services: Never    Database administrator or Organizations: No    Attends Banker Meetings: Never  Marital Status: Married     Family History: The patient's family history includes Brain cancer in his father; Breast cancer in his mother and sister; Heart attack in his sister; Lung cancer in his father.  ROS:   All other ROS reviewed and negative. Pertinent positives noted in the HPI.     EKGs/Labs/Other Studies Reviewed:   The following studies were personally reviewed by me today:  Recent Labs: 05/27/2022: Magnesium 2.0 12/11/2022: ALT 22; BUN 18; Creatinine, Ser 0.84; Hemoglobin 13.4; Platelets 226; Potassium 4.7; Sodium 145 01/19/2023: TSH 2.21   Recent Lipid Panel    Component Value Date/Time   CHOL 136 12/11/2022 1106   TRIG 120 12/11/2022 1106   HDL 41 12/11/2022 1106   CHOLHDL 3.3 12/11/2022 1106   CHOLHDL 4 07/14/2021 1113   VLDL 36.2 07/14/2021 1113   LDLCALC 73 12/11/2022 1106    Physical Exam:   VS:  BP (!) 148/76 (BP Location: Left Arm, Patient Position: Sitting, Cuff Size: Large)   Pulse 72   Ht 5\' 6"  (1.676 m)   Wt 247 lb (112 kg)   SpO2 97%   BMI 39.87 kg/m    Wt Readings from Last 3 Encounters:  03/16/23 247 lb (112 kg)  12/16/22 250 lb 8 oz (113.6 kg)  12/11/22 244 lb 12.8 oz (111 kg)    General: Well nourished, well developed, in no acute distress Head: Atraumatic, normal size  Eyes: PEERLA, EOMI  Neck: Supple, no JVD Endocrine: No thryomegaly Cardiac: Normal S1, S2; RRR; no murmurs, rubs, or  gallops Lungs: Clear to auscultation bilaterally, no wheezing, rhonchi or rales  Abd: Soft, nontender, no hepatomegaly  Ext: No edema, pulses 2+ Musculoskeletal: No deformities, BUE and BLE strength normal and equal Skin: Warm and dry, no rashes   Neuro: Alert and oriented to person, place, time, and situation, CNII-XII grossly intact, no focal deficits  Psych: Normal mood and affect   ASSESSMENT:   Mitchell Humphrey is a 82 y.o. male who presents for the following: 1. Primary hypertension   2. S/P TAVR (transcatheter aortic valve replacement)   3. Agatston coronary artery calcium score greater than 400   4. Coronary artery disease involving native coronary artery of native heart without angina pectoris   5. Hypercholesterolemia   6. Severe aortic stenosis     PLAN:   1. Primary hypertension -Blood pressure remains elevated.  Stop enalapril.  Start losartan 50 mg twice daily.  Reports blood pressure values are more elevated at night.  I believe twice daily dosing will help.  Continue Aldactone 25 mg daily.  Continue to keep a log.  He will see one of our APP's in 3 months.  He will see me in 6 months.  2. S/P TAVR (transcatheter aortic valve replacement) -Status post TAVR last year.  Doing well.  No murmur on exam.  Yearly echo will be done in July.  Continue aspirin 81 mg daily.  He understands he needs antibiotics before dental work.  3. Agatston coronary artery calcium score greater than 400 4. Coronary artery disease involving native coronary artery of native heart without angina pectoris 5. Hypercholesterolemia -Elevated calcium score.  Nonobstructive disease.  Continue aspirin 81 mg daily.  On statin.  LDL close to goal.  6. Severe aortic stenosis -Status post TAVR.  Doing well.  See discussion above.      Disposition: Return in about 3 months (around 06/15/2023).  Medication Adjustments/Labs and Tests Ordered: Current medicines are reviewed at length with  the patient today.   Concerns regarding medicines are outlined above.  No orders of the defined types were placed in this encounter.  Meds ordered this encounter  Medications   losartan (COZAAR) 50 MG tablet    Sig: Take 1 tablet (50 mg total) by mouth 2 (two) times daily.    Dispense:  180 tablet    Refill:  3    Patient Instructions  Medication Instructions:  STOP Enalapril  START Losartan 50 mg twice daily   *If you need a refill on your cardiac medications before your next appointment, please call your pharmacy*   Follow-Up: At [facility], you and your health needs are our priority.  As part of our continuing mission to provide you with exceptional heart care, we have created designated Provider Care Teams.  These Care Teams include your primary Cardiologist (physician) and Advanced Practice Providers (APPs -  Physician Assistants and Nurse Practitioners) who all work together to provide you with the care you need, when you need it.  We recommend signing up for the patient portal called "MyChart".  Sign up information is provided on this After Visit Summary.  MyChart is used to connect with patients for Virtual Visits (Telemedicine).  Patients are able to view lab/test results, encounter notes, upcoming appointments, etc.  Non-urgent messages can be sent to your provider as well.   To learn more about what you can do with MyChart, go to [email or web address].    Your next appointment:   3 month(s)  Provider:   Marjie Skiff, PA-C, Azalee Course, PA-C, or Bernadene Person, NP    Then, Reatha Harps, MD will plan to see you again in 6 month(s).      Time Spent with Patient: I have spent a total of 25 minutes with patient reviewing hospital notes, telemetry, EKGs, labs and examining the patient as well as establishing an assessment and plan that was discussed with the patient.  > 50% of time was spent in direct patient care.  Signed, Lenna Gilford. Flora Lipps, MD, [facility]  [facility]   [address] [place], [place] [address] [phone]  03/16/2023 2:33 PM

## 2023-03-16 ENCOUNTER — Encounter: Payer: Self-pay | Admitting: Cardiovascular Disease

## 2023-03-16 ENCOUNTER — Other Ambulatory Visit (HOSPITAL_COMMUNITY): Payer: Self-pay

## 2023-03-16 ENCOUNTER — Ambulatory Visit: Payer: HMO | Attending: Cardiovascular Disease | Admitting: Cardiovascular Disease

## 2023-03-16 VITALS — BP 148/76 | HR 72 | Ht 66.0 in | Wt 247.0 lb

## 2023-03-16 DIAGNOSIS — R931 Abnormal findings on diagnostic imaging of heart and coronary circulation: Secondary | ICD-10-CM | POA: Diagnosis not present

## 2023-03-16 DIAGNOSIS — Z952 Presence of prosthetic heart valve: Secondary | ICD-10-CM

## 2023-03-16 DIAGNOSIS — E7800 Pure hypercholesterolemia, unspecified: Secondary | ICD-10-CM

## 2023-03-16 DIAGNOSIS — I10 Essential (primary) hypertension: Secondary | ICD-10-CM

## 2023-03-16 DIAGNOSIS — I2510 Atherosclerotic heart disease of native coronary artery without angina pectoris: Secondary | ICD-10-CM

## 2023-03-16 DIAGNOSIS — I350 Nonrheumatic aortic (valve) stenosis: Secondary | ICD-10-CM

## 2023-03-16 MED ORDER — LOSARTAN POTASSIUM 50 MG PO TABS
50.0000 mg | ORAL_TABLET | Freq: Two times a day (BID) | ORAL | 3 refills | Status: DC
  Filled 2023-03-16: qty 180, 90d supply, fill #0
  Filled 2023-06-14 – 2023-06-15 (×2): qty 180, 90d supply, fill #1
  Filled 2023-09-09: qty 180, 90d supply, fill #2
  Filled 2023-12-09: qty 180, 90d supply, fill #3

## 2023-03-16 NOTE — Patient Instructions (Signed)
Medication Instructions:  STOP Enalapril  START Losartan 50 mg twice daily   *If you need a refill on your cardiac medications before your next appointment, please call your pharmacy*   Follow-Up: At [facility], you and your health needs are our priority.  As part of our continuing mission to provide you with exceptional heart care, we have created designated Provider Care Teams.  These Care Teams include your primary Cardiologist (physician) and Advanced Practice Providers (APPs -  Physician Assistants and Nurse Practitioners) who all work together to provide you with the care you need, when you need it.  We recommend signing up for the patient portal called "MyChart".  Sign up information is provided on this After Visit Summary.  MyChart is used to connect with patients for Virtual Visits (Telemedicine).  Patients are able to view lab/test results, encounter notes, upcoming appointments, etc.  Non-urgent messages can be sent to your provider as well.   To learn more about what you can do with MyChart, go to [email or web address].    Your next appointment:   3 month(s)  Provider:   Marjie Skiff, PA-C, Azalee Course, PA-C, or Bernadene Person, NP    Then, Reatha Harps, MD will plan to see you again in 6 month(s).

## 2023-03-17 ENCOUNTER — Other Ambulatory Visit: Payer: Self-pay | Admitting: Physician Assistant

## 2023-03-17 ENCOUNTER — Other Ambulatory Visit (HOSPITAL_COMMUNITY): Payer: Self-pay

## 2023-03-17 DIAGNOSIS — J321 Chronic frontal sinusitis: Secondary | ICD-10-CM | POA: Diagnosis not present

## 2023-03-17 DIAGNOSIS — J342 Deviated nasal septum: Secondary | ICD-10-CM | POA: Diagnosis not present

## 2023-03-17 DIAGNOSIS — J3489 Other specified disorders of nose and nasal sinuses: Secondary | ICD-10-CM | POA: Diagnosis not present

## 2023-03-18 ENCOUNTER — Other Ambulatory Visit (HOSPITAL_COMMUNITY): Payer: Self-pay

## 2023-03-18 DIAGNOSIS — R293 Abnormal posture: Secondary | ICD-10-CM | POA: Diagnosis not present

## 2023-03-18 DIAGNOSIS — M25651 Stiffness of right hip, not elsewhere classified: Secondary | ICD-10-CM | POA: Diagnosis not present

## 2023-03-18 DIAGNOSIS — M25551 Pain in right hip: Secondary | ICD-10-CM | POA: Diagnosis not present

## 2023-03-18 DIAGNOSIS — R262 Difficulty in walking, not elsewhere classified: Secondary | ICD-10-CM | POA: Diagnosis not present

## 2023-03-18 DIAGNOSIS — M5431 Sciatica, right side: Secondary | ICD-10-CM | POA: Diagnosis not present

## 2023-03-18 MED ORDER — MELOXICAM 15 MG PO TABS
15.0000 mg | ORAL_TABLET | Freq: Every day | ORAL | 1 refills | Status: DC
  Filled 2023-03-18: qty 90, 90d supply, fill #0
  Filled 2023-06-14 – 2023-06-15 (×2): qty 90, 90d supply, fill #1

## 2023-03-22 DIAGNOSIS — M5431 Sciatica, right side: Secondary | ICD-10-CM | POA: Diagnosis not present

## 2023-03-22 DIAGNOSIS — R293 Abnormal posture: Secondary | ICD-10-CM | POA: Diagnosis not present

## 2023-03-22 DIAGNOSIS — R262 Difficulty in walking, not elsewhere classified: Secondary | ICD-10-CM | POA: Diagnosis not present

## 2023-03-22 DIAGNOSIS — M25551 Pain in right hip: Secondary | ICD-10-CM | POA: Diagnosis not present

## 2023-03-22 DIAGNOSIS — M25651 Stiffness of right hip, not elsewhere classified: Secondary | ICD-10-CM | POA: Diagnosis not present

## 2023-03-24 DIAGNOSIS — R262 Difficulty in walking, not elsewhere classified: Secondary | ICD-10-CM | POA: Diagnosis not present

## 2023-03-24 DIAGNOSIS — M25551 Pain in right hip: Secondary | ICD-10-CM | POA: Diagnosis not present

## 2023-03-24 DIAGNOSIS — M25651 Stiffness of right hip, not elsewhere classified: Secondary | ICD-10-CM | POA: Diagnosis not present

## 2023-03-24 DIAGNOSIS — M5431 Sciatica, right side: Secondary | ICD-10-CM | POA: Diagnosis not present

## 2023-03-24 DIAGNOSIS — R293 Abnormal posture: Secondary | ICD-10-CM | POA: Diagnosis not present

## 2023-03-27 ENCOUNTER — Other Ambulatory Visit (HOSPITAL_COMMUNITY): Payer: Self-pay

## 2023-03-29 ENCOUNTER — Other Ambulatory Visit (HOSPITAL_COMMUNITY): Payer: Self-pay

## 2023-03-29 DIAGNOSIS — M25651 Stiffness of right hip, not elsewhere classified: Secondary | ICD-10-CM | POA: Diagnosis not present

## 2023-03-29 DIAGNOSIS — M25551 Pain in right hip: Secondary | ICD-10-CM | POA: Diagnosis not present

## 2023-03-29 DIAGNOSIS — M5431 Sciatica, right side: Secondary | ICD-10-CM | POA: Diagnosis not present

## 2023-03-29 DIAGNOSIS — R293 Abnormal posture: Secondary | ICD-10-CM | POA: Diagnosis not present

## 2023-03-29 DIAGNOSIS — R262 Difficulty in walking, not elsewhere classified: Secondary | ICD-10-CM | POA: Diagnosis not present

## 2023-03-29 MED ORDER — DOXYCYCLINE HYCLATE 50 MG PO CAPS
ORAL_CAPSULE | ORAL | 0 refills | Status: DC
  Filled 2023-03-29: qty 20, 10d supply, fill #0

## 2023-03-31 DIAGNOSIS — M5431 Sciatica, right side: Secondary | ICD-10-CM | POA: Diagnosis not present

## 2023-03-31 DIAGNOSIS — R262 Difficulty in walking, not elsewhere classified: Secondary | ICD-10-CM | POA: Diagnosis not present

## 2023-03-31 DIAGNOSIS — M25651 Stiffness of right hip, not elsewhere classified: Secondary | ICD-10-CM | POA: Diagnosis not present

## 2023-03-31 DIAGNOSIS — R293 Abnormal posture: Secondary | ICD-10-CM | POA: Diagnosis not present

## 2023-03-31 DIAGNOSIS — M25551 Pain in right hip: Secondary | ICD-10-CM | POA: Diagnosis not present

## 2023-04-05 ENCOUNTER — Telehealth: Payer: Self-pay | Admitting: Physician Assistant

## 2023-04-05 ENCOUNTER — Other Ambulatory Visit (HOSPITAL_COMMUNITY): Payer: Self-pay

## 2023-04-05 MED ORDER — FINASTERIDE 5 MG PO TABS
5.0000 mg | ORAL_TABLET | Freq: Every day | ORAL | 1 refills | Status: DC
  Filled 2023-04-05: qty 90, 90d supply, fill #0
  Filled 2023-07-05: qty 90, 90d supply, fill #1

## 2023-04-05 MED ORDER — TAMSULOSIN HCL 0.4 MG PO CAPS
0.4000 mg | ORAL_CAPSULE | Freq: Every day | ORAL | 1 refills | Status: DC
  Filled 2023-04-05: qty 90, 90d supply, fill #0
  Filled 2023-07-05: qty 90, 90d supply, fill #1

## 2023-04-05 NOTE — Telephone Encounter (Signed)
Pt notified both Rx's were sent to pharmacy as requested. 

## 2023-04-05 NOTE — Telephone Encounter (Signed)
Patient has new Preferred Pharmacy (listed in Chart and below)  Prescription Request  04/05/2023  LOV: 12/16/2022  What is the name of the medication or equipment?  finasteride (PROSCAR) 5 MG tablet   And  tamsulosin (FLOMAX) 0.4 MG CAPS capsule   Have you contacted your pharmacy to request a refill? Yes   Which pharmacy would you like this sent to?  [place] [facility] [address] [place] [address] Phone: [phone] Fax: [phone]    Patient notified that their request is being sent to the clinical staff for review and that they should receive a response within 2 business days.   Please advise at Mobile [phone] [facility])

## 2023-04-07 DIAGNOSIS — R262 Difficulty in walking, not elsewhere classified: Secondary | ICD-10-CM | POA: Diagnosis not present

## 2023-04-07 DIAGNOSIS — R293 Abnormal posture: Secondary | ICD-10-CM | POA: Diagnosis not present

## 2023-04-07 DIAGNOSIS — M25651 Stiffness of right hip, not elsewhere classified: Secondary | ICD-10-CM | POA: Diagnosis not present

## 2023-04-07 DIAGNOSIS — M25551 Pain in right hip: Secondary | ICD-10-CM | POA: Diagnosis not present

## 2023-04-07 DIAGNOSIS — M5431 Sciatica, right side: Secondary | ICD-10-CM | POA: Diagnosis not present

## 2023-04-13 DIAGNOSIS — M25651 Stiffness of right hip, not elsewhere classified: Secondary | ICD-10-CM | POA: Diagnosis not present

## 2023-04-13 DIAGNOSIS — R293 Abnormal posture: Secondary | ICD-10-CM | POA: Diagnosis not present

## 2023-04-13 DIAGNOSIS — R262 Difficulty in walking, not elsewhere classified: Secondary | ICD-10-CM | POA: Diagnosis not present

## 2023-04-13 DIAGNOSIS — M5431 Sciatica, right side: Secondary | ICD-10-CM | POA: Diagnosis not present

## 2023-04-13 DIAGNOSIS — M25551 Pain in right hip: Secondary | ICD-10-CM | POA: Diagnosis not present

## 2023-04-15 DIAGNOSIS — M5431 Sciatica, right side: Secondary | ICD-10-CM | POA: Diagnosis not present

## 2023-04-15 DIAGNOSIS — R262 Difficulty in walking, not elsewhere classified: Secondary | ICD-10-CM | POA: Diagnosis not present

## 2023-04-15 DIAGNOSIS — M25551 Pain in right hip: Secondary | ICD-10-CM | POA: Diagnosis not present

## 2023-04-15 DIAGNOSIS — R293 Abnormal posture: Secondary | ICD-10-CM | POA: Diagnosis not present

## 2023-04-15 DIAGNOSIS — M25651 Stiffness of right hip, not elsewhere classified: Secondary | ICD-10-CM | POA: Diagnosis not present

## 2023-04-19 ENCOUNTER — Ambulatory Visit (INDEPENDENT_AMBULATORY_CARE_PROVIDER_SITE_OTHER): Payer: HMO | Admitting: Pulmonary Disease

## 2023-04-19 ENCOUNTER — Encounter (HOSPITAL_BASED_OUTPATIENT_CLINIC_OR_DEPARTMENT_OTHER): Payer: Self-pay | Admitting: Pulmonary Disease

## 2023-04-19 VITALS — BP 142/76 | HR 71 | Temp 97.8°F | Ht 69.0 in | Wt 247.6 lb

## 2023-04-19 DIAGNOSIS — J849 Interstitial pulmonary disease, unspecified: Secondary | ICD-10-CM

## 2023-04-19 NOTE — Patient Instructions (Signed)
  X Amb sat 

## 2023-04-19 NOTE — Progress Notes (Signed)
   Subjective:    Patient ID: Mitchell Humphrey, male    DOB: 1941/09/05, 82 y.o.   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  He worked as a Chief of Staff.     PMH : TAVR He was hospitalized 11/2021 for Covid pneumonia, discharged on 3 L oxygen.  Course was complicated by left lower extremity below-knee DVT for which he was placed on Eliquis.   Annual follow-up visit. He underwent TAVR in the interim and has done well.  He walks a mile every day.  He complains of repeated sinus infections since he had COVID and requires frequent antibiotics, he follows up with ENT for this. Overall his breathing is doing well and he denies frequent coughing. On ambulation he does not desaturate today  Chest x-ray 05/2022 does not show any evidence of ILD   Significant tests/ events reviewed     CT cors 02/23/22 Areas of peripheral predominant mild ground-glass and architectural distortion bilaterally  04/27/2022 PFT: FVC 98, FEV1 117, ratio 88, TLC 101, DLCOcor 92.  No BD.    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 Also denies presyncope, palpitations, heartburn, abdominal pain, nausea, vomiting, diarrhea or change in bowel or urinary habits, dysuria,hematuria, rash, arthralgias, visual complaints, headache, numbness weakness or ataxia.     Objective:   Physical Exam  Gen. Pleasant, obese, in no distress ENT - no lesions, no post nasal drip Neck: No JVD, no thyromegaly, no carotid bruits Lungs: no use of  accessory muscles, Rt basal crackles no rhonchi  Cardiovascular: Rhythm regular, heart sounds  normal, no murmurs or gallops, no peripheral edema Musculoskeletal: No deformities, no cyanosis or clubbing , no tremors       Assessment & Plan:

## 2023-04-19 NOTE — Assessment & Plan Note (Signed)
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  He is doing very well with excellent exercise tolerance since his TAVR.  He does not desaturate on ambulation today Will hold off on further testing unless there is clinical deterioration

## 2023-04-20 DIAGNOSIS — M5431 Sciatica, right side: Secondary | ICD-10-CM | POA: Diagnosis not present

## 2023-04-20 DIAGNOSIS — R293 Abnormal posture: Secondary | ICD-10-CM | POA: Diagnosis not present

## 2023-04-20 DIAGNOSIS — M25551 Pain in right hip: Secondary | ICD-10-CM | POA: Diagnosis not present

## 2023-04-20 DIAGNOSIS — M25651 Stiffness of right hip, not elsewhere classified: Secondary | ICD-10-CM | POA: Diagnosis not present

## 2023-04-20 DIAGNOSIS — R262 Difficulty in walking, not elsewhere classified: Secondary | ICD-10-CM | POA: Diagnosis not present

## 2023-04-26 DIAGNOSIS — R262 Difficulty in walking, not elsewhere classified: Secondary | ICD-10-CM | POA: Diagnosis not present

## 2023-04-26 DIAGNOSIS — M5431 Sciatica, right side: Secondary | ICD-10-CM | POA: Diagnosis not present

## 2023-04-26 DIAGNOSIS — R293 Abnormal posture: Secondary | ICD-10-CM | POA: Diagnosis not present

## 2023-04-26 DIAGNOSIS — M25551 Pain in right hip: Secondary | ICD-10-CM | POA: Diagnosis not present

## 2023-04-26 DIAGNOSIS — M25651 Stiffness of right hip, not elsewhere classified: Secondary | ICD-10-CM | POA: Diagnosis not present

## 2023-04-28 DIAGNOSIS — M5431 Sciatica, right side: Secondary | ICD-10-CM | POA: Diagnosis not present

## 2023-04-28 DIAGNOSIS — M25651 Stiffness of right hip, not elsewhere classified: Secondary | ICD-10-CM | POA: Diagnosis not present

## 2023-04-28 DIAGNOSIS — M25551 Pain in right hip: Secondary | ICD-10-CM | POA: Diagnosis not present

## 2023-04-28 DIAGNOSIS — R262 Difficulty in walking, not elsewhere classified: Secondary | ICD-10-CM | POA: Diagnosis not present

## 2023-04-28 DIAGNOSIS — R293 Abnormal posture: Secondary | ICD-10-CM | POA: Diagnosis not present

## 2023-04-30 DIAGNOSIS — Z961 Presence of intraocular lens: Secondary | ICD-10-CM | POA: Diagnosis not present

## 2023-04-30 DIAGNOSIS — H4322 Crystalline deposits in vitreous body, left eye: Secondary | ICD-10-CM | POA: Diagnosis not present

## 2023-04-30 DIAGNOSIS — H35033 Hypertensive retinopathy, bilateral: Secondary | ICD-10-CM | POA: Diagnosis not present

## 2023-04-30 DIAGNOSIS — H26493 Other secondary cataract, bilateral: Secondary | ICD-10-CM | POA: Diagnosis not present

## 2023-04-30 DIAGNOSIS — H43813 Vitreous degeneration, bilateral: Secondary | ICD-10-CM | POA: Diagnosis not present

## 2023-05-03 DIAGNOSIS — M25551 Pain in right hip: Secondary | ICD-10-CM | POA: Diagnosis not present

## 2023-05-03 DIAGNOSIS — M25651 Stiffness of right hip, not elsewhere classified: Secondary | ICD-10-CM | POA: Diagnosis not present

## 2023-05-03 DIAGNOSIS — R262 Difficulty in walking, not elsewhere classified: Secondary | ICD-10-CM | POA: Diagnosis not present

## 2023-05-03 DIAGNOSIS — R293 Abnormal posture: Secondary | ICD-10-CM | POA: Diagnosis not present

## 2023-05-03 DIAGNOSIS — M5431 Sciatica, right side: Secondary | ICD-10-CM | POA: Diagnosis not present

## 2023-05-05 ENCOUNTER — Other Ambulatory Visit: Payer: Self-pay | Admitting: Physician Assistant

## 2023-05-05 ENCOUNTER — Other Ambulatory Visit: Payer: Self-pay

## 2023-05-05 ENCOUNTER — Telehealth: Payer: Self-pay | Admitting: Physician Assistant

## 2023-05-05 ENCOUNTER — Other Ambulatory Visit (HOSPITAL_COMMUNITY): Payer: Self-pay

## 2023-05-05 DIAGNOSIS — R293 Abnormal posture: Secondary | ICD-10-CM | POA: Diagnosis not present

## 2023-05-05 DIAGNOSIS — M25651 Stiffness of right hip, not elsewhere classified: Secondary | ICD-10-CM | POA: Diagnosis not present

## 2023-05-05 DIAGNOSIS — M25551 Pain in right hip: Secondary | ICD-10-CM | POA: Diagnosis not present

## 2023-05-05 DIAGNOSIS — M5431 Sciatica, right side: Secondary | ICD-10-CM | POA: Diagnosis not present

## 2023-05-05 DIAGNOSIS — R262 Difficulty in walking, not elsewhere classified: Secondary | ICD-10-CM | POA: Diagnosis not present

## 2023-05-05 MED ORDER — IPRATROPIUM BROMIDE 0.06 % NA SOLN
2.0000 | Freq: Four times a day (QID) | NASAL | 12 refills | Status: AC
  Filled 2023-05-05: qty 15, 19d supply, fill #0
  Filled 2023-08-07: qty 15, 19d supply, fill #1

## 2023-05-05 MED ORDER — ATORVASTATIN CALCIUM 20 MG PO TABS
20.0000 mg | ORAL_TABLET | Freq: Every day | ORAL | 1 refills | Status: DC
  Filled 2023-05-05: qty 90, 90d supply, fill #0

## 2023-05-05 NOTE — Telephone Encounter (Signed)
Prescription Request  New Preferred Pharmacy listed below and and in Chart  05/05/2023  LOV: 12/16/2022  What is the name of the medication or equipment?  atorvastatin (LIPITOR) 20 MG tablet  Have you contacted your pharmacy to request a refill? Yes   Which pharmacy would you like this sent to?  [place] [facility] [address] [place] [address] Phone: [phone] Fax: [phone]    Patient notified that their request is being sent to the clinical staff for review and that they should receive a response within 2 business days.   Please advise at Mobile [phone] (mobile)

## 2023-05-05 NOTE — Telephone Encounter (Signed)
Rx sent to pharmacy   

## 2023-05-06 ENCOUNTER — Other Ambulatory Visit (HOSPITAL_COMMUNITY): Payer: Self-pay

## 2023-05-10 DIAGNOSIS — R293 Abnormal posture: Secondary | ICD-10-CM | POA: Diagnosis not present

## 2023-05-10 DIAGNOSIS — M5431 Sciatica, right side: Secondary | ICD-10-CM | POA: Diagnosis not present

## 2023-05-10 DIAGNOSIS — M25551 Pain in right hip: Secondary | ICD-10-CM | POA: Diagnosis not present

## 2023-05-10 DIAGNOSIS — M25651 Stiffness of right hip, not elsewhere classified: Secondary | ICD-10-CM | POA: Diagnosis not present

## 2023-05-10 DIAGNOSIS — R262 Difficulty in walking, not elsewhere classified: Secondary | ICD-10-CM | POA: Diagnosis not present

## 2023-05-12 DIAGNOSIS — M5431 Sciatica, right side: Secondary | ICD-10-CM | POA: Diagnosis not present

## 2023-05-12 DIAGNOSIS — M25651 Stiffness of right hip, not elsewhere classified: Secondary | ICD-10-CM | POA: Diagnosis not present

## 2023-05-12 DIAGNOSIS — M25551 Pain in right hip: Secondary | ICD-10-CM | POA: Diagnosis not present

## 2023-05-12 DIAGNOSIS — R262 Difficulty in walking, not elsewhere classified: Secondary | ICD-10-CM | POA: Diagnosis not present

## 2023-05-12 DIAGNOSIS — R293 Abnormal posture: Secondary | ICD-10-CM | POA: Diagnosis not present

## 2023-05-17 DIAGNOSIS — M25551 Pain in right hip: Secondary | ICD-10-CM | POA: Diagnosis not present

## 2023-05-17 DIAGNOSIS — M5431 Sciatica, right side: Secondary | ICD-10-CM | POA: Diagnosis not present

## 2023-05-17 DIAGNOSIS — R293 Abnormal posture: Secondary | ICD-10-CM | POA: Diagnosis not present

## 2023-05-17 DIAGNOSIS — R262 Difficulty in walking, not elsewhere classified: Secondary | ICD-10-CM | POA: Diagnosis not present

## 2023-05-17 DIAGNOSIS — M25651 Stiffness of right hip, not elsewhere classified: Secondary | ICD-10-CM | POA: Diagnosis not present

## 2023-05-19 DIAGNOSIS — R262 Difficulty in walking, not elsewhere classified: Secondary | ICD-10-CM | POA: Diagnosis not present

## 2023-05-19 DIAGNOSIS — M25651 Stiffness of right hip, not elsewhere classified: Secondary | ICD-10-CM | POA: Diagnosis not present

## 2023-05-19 DIAGNOSIS — M5431 Sciatica, right side: Secondary | ICD-10-CM | POA: Diagnosis not present

## 2023-05-19 DIAGNOSIS — M25551 Pain in right hip: Secondary | ICD-10-CM | POA: Diagnosis not present

## 2023-05-19 DIAGNOSIS — R293 Abnormal posture: Secondary | ICD-10-CM | POA: Diagnosis not present

## 2023-05-24 DIAGNOSIS — R262 Difficulty in walking, not elsewhere classified: Secondary | ICD-10-CM | POA: Diagnosis not present

## 2023-05-24 DIAGNOSIS — R293 Abnormal posture: Secondary | ICD-10-CM | POA: Diagnosis not present

## 2023-05-24 DIAGNOSIS — M5431 Sciatica, right side: Secondary | ICD-10-CM | POA: Diagnosis not present

## 2023-05-24 DIAGNOSIS — M25651 Stiffness of right hip, not elsewhere classified: Secondary | ICD-10-CM | POA: Diagnosis not present

## 2023-05-24 DIAGNOSIS — M25551 Pain in right hip: Secondary | ICD-10-CM | POA: Diagnosis not present

## 2023-05-26 DIAGNOSIS — R262 Difficulty in walking, not elsewhere classified: Secondary | ICD-10-CM | POA: Diagnosis not present

## 2023-05-26 DIAGNOSIS — M5431 Sciatica, right side: Secondary | ICD-10-CM | POA: Diagnosis not present

## 2023-05-26 DIAGNOSIS — R293 Abnormal posture: Secondary | ICD-10-CM | POA: Diagnosis not present

## 2023-05-26 DIAGNOSIS — M25651 Stiffness of right hip, not elsewhere classified: Secondary | ICD-10-CM | POA: Diagnosis not present

## 2023-05-26 DIAGNOSIS — M25551 Pain in right hip: Secondary | ICD-10-CM | POA: Diagnosis not present

## 2023-05-29 ENCOUNTER — Other Ambulatory Visit (HOSPITAL_COMMUNITY): Payer: Self-pay

## 2023-05-31 DIAGNOSIS — M25651 Stiffness of right hip, not elsewhere classified: Secondary | ICD-10-CM | POA: Diagnosis not present

## 2023-05-31 DIAGNOSIS — R262 Difficulty in walking, not elsewhere classified: Secondary | ICD-10-CM | POA: Diagnosis not present

## 2023-05-31 DIAGNOSIS — R293 Abnormal posture: Secondary | ICD-10-CM | POA: Diagnosis not present

## 2023-05-31 DIAGNOSIS — M25551 Pain in right hip: Secondary | ICD-10-CM | POA: Diagnosis not present

## 2023-05-31 DIAGNOSIS — M5431 Sciatica, right side: Secondary | ICD-10-CM | POA: Diagnosis not present

## 2023-06-01 NOTE — Progress Notes (Unsigned)
[facility]   MULTIDISCIPLINARY HEART VALVE CLINIC                                     Cardiology Office Note:    Date:  06/02/2023   ID:  Mitchell Humphrey, DOB 1941/08/05, MRN [id number]  PCP:  Jarold Motto, PA  [facility] Cardiologist:  Reatha Harps, MD / Dr, Clifton James, MD and Dr. Laneta Simmers, MD (TAVR)  [facility] Electrophysiologist:  None   Referring MD: Jarold Motto, PA   1 year s/p TAVR  History of Present Illness:    Mitchell Humphrey is a 82 y.o. 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    A 2D echocardiogram in 12/2021 showed EF 60-65% with moderate to severe AS. He underwent a cardiac scoring CT on 02/23/2022 which showed an aortic valve calcium score of 2428 consistent with severe aortic stenosis.  The coronary calcium score was 1599 which was 81st percentile. The scan also showed areas of bilateral peripheral groundglass density with architectural distortion that were felt to be likely due to fibrosis from his prior COVID-19 pneumonia. Cardiac catheterization showed mild nonobstructive coronary disease with mildly elevated LV filling pressures. He complained of 6-12 mo of progressive dyspnea and fatigue.   He was evaluated by the multidisciplinary valve team and underwent successful TAVR with a 29 mm Edwards Sapien 3 UR via the TF approach on 05/26/22. Post operative echo showed EF 60%, normally functioning TAVR with a mean gradient of 7.5 mmHg and no PVL. 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 He was discharged on a baby aspirin. Zio AT did not show any HAVB or significant arrhythmias except 1 short run of SVT and frequent PVCs (7.3% burden). 1 month echo showed echo showed EF 55%, normally functioning TAVR with a mean gradient of 7 mm hg and no PVL.  Seen by Dr. Flora Lipps in April. BP under poor control. Enalapril stopped  and started on Losartan 50mg  BID.    Today the patient presents to clinic for follow up. Once in while gets fatigue when out working in the yard or walking too much. No CP or SOB. No LE edema, orthopnea or PND. No dizziness or syncope. No blood in stool or urine. No palpitations. Review of home BP log shows poor control with SBP ranging from 140-170s.   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 Clifton James and Dr. Laneta Simmers   Severe aortic stenosis     Past Surgical History:  Procedure Laterality Date   APPENDECTOMY     COLONOSCOPY  08/2008   Dr. Ewing Schlein   HERNIA REPAIR  1970   [facility]   INTRAOPERATIVE TRANSTHORACIC ECHOCARDIOGRAM N/A 05/26/2022  Procedure: INTRAOPERATIVE TRANSTHORACIC ECHOCARDIOGRAM;  Surgeon: Kathleene Hazel, MD;  Location: [facility];  Service: Open Heart Surgery;  Laterality: N/A;   RIGHT HEART CATH AND CORONARY ANGIOGRAPHY N/A 03/11/2022   Procedure: RIGHT HEART CATH AND CORONARY ANGIOGRAPHY;  Surgeon: Kathleene Hazel, MD;  Location: [facility];  Service: Cardiovascular;  Laterality: N/A;   TRANSCATHETER AORTIC VALVE REPLACEMENT, TRANSFEMORAL N/A 05/26/2022   Procedure: Transcatheter Aortic Valve Replacement, Transfemoral;  Surgeon: Kathleene Hazel, MD;  Location: [facility];  Service: Open Heart Surgery;  Laterality: N/A;    Current Medications: Current Meds  Medication Sig   amLODipine (NORVASC) 5 MG tablet Take 1 tablet (5 mg total) by mouth daily.    aspirin EC 81 MG tablet Take 81 mg by mouth daily. Swallow whole.   atorvastatin (LIPITOR) 20 MG tablet Take 1 tablet (20 mg total) by mouth daily.   cetirizine (ZYRTEC) 10 MG tablet Take 10 mg by mouth daily.   Cholecalciferol (VITAMIN D3) 125 MCG (5000 UT) CAPS Take 5,000 Units by mouth daily.   Cyanocobalamin (VITAMIN B 12 PO) Take 1 capsule by mouth daily in the afternoon.   diclofenac Sodium (VOLTAREN) 1 % GEL Apply 2 g topically daily as needed (pain).   ELDERBERRY PO Take 1 capsule by mouth daily.   finasteride (PROSCAR) 5 MG tablet Take 1 tablet (5 mg total) by mouth daily.   ipratropium (ATROVENT) 0.06 % nasal spray Place 2 sprays into both nostrils 4 (four) times daily.   levothyroxine (SYNTHROID) 125 MCG tablet Take 1 tablet (125 mcg total) by mouth daily.   loratadine (CLARITIN) 10 MG tablet Take 10 mg by mouth daily.   losartan (COZAAR) 50 MG tablet Take 1 tablet (50 mg total) by mouth 2 (two) times daily.   meloxicam (MOBIC) 15 MG tablet TAKE 1 TABLET (15 MG TOTAL) BY MOUTH DAILY.   Multiple Vitamins-Minerals (IMMUNE SUPPORT PO) Take 1 tablet by mouth daily. NHR Immune Protect   oxybutynin (DITROPAN-XL) 5 MG 24 hr tablet Take 5 mg by mouth daily.   oxybutynin (DITROPAN-XL) 5 MG 24 hr tablet Take 1 tablet (5 mg total) by mouth daily.   Polyethyl Glycol-Propyl Glycol (SYSTANE OP) Place 1 drop into both eyes daily as needed (irritation).   spironolactone (ALDACTONE) 25 MG tablet Take 1 tablet (25 mg total) by mouth daily.   tamsulosin (FLOMAX) 0.4 MG CAPS capsule Take 1 capsule (0.4 mg total) by mouth daily.     Allergies:   Penicillins   Social History   Socioeconomic History   Marital status: Married    Spouse name: Not on file   Number of children: 3   Years of education: Not on file   Highest education level: Not on file  Occupational History   Occupation: Administrator, Civil Service   Occupation: Retired-Construction  Tobacco Use   Smoking status: Never   Smokeless  tobacco: Never   Tobacco comments:    He chews on a cigar  Vaping Use   Vaping status: Never Used  Substance and Sexual Activity   Alcohol use: No   Drug use: No   Sexual activity: Not Currently  Other Topics Concern   Not on file  Social History Narrative   Superintendent for construction site   Lives with wife   2 step children   2 sons   Social Determinants of Health   Financial Resource Strain: Low Risk  (10/20/2022)   Overall Financial Resource Strain (CARDIA)  Difficulty of Paying Living Expenses: Not hard at all  Food Insecurity: Low Risk  (02/23/2023)   [facility]   Food vital sign    Within the past 12 months, you worried that your food would run out before you got money to buy more: Never true    Within the past 12 months, the food you bought just didn't last and you didn't have money to get more. : Never true  Transportation Needs: No Transportation Needs (02/23/2023)   [facility]   Transportation    In the past 12 months, has lack of reliable transportation kept you from medical appointments, meetings, work or from getting things needed for daily living? : No  Physical Activity: Sufficiently Active (10/20/2022)   Exercise Vital Sign    Days of Exercise per Week: 7 days    Minutes of Exercise per Session: 30 min  Stress: No Stress Concern Present (10/20/2022)   [employer] of Occupational Health - Occupational Stress Questionnaire    Feeling of Stress : Not at all  Social Connections: Moderately Isolated (10/20/2022)   Social Connection and Isolation Panel [NHANES]    Frequency of Communication with Friends and Family: More than three times a week    Frequency of Social Gatherings with Friends and Family: More than three times a week    Attends Religious Services: Never    Database administrator or Organizations: No    Attends Engineer, structural: Never    Marital Status: Married      Family History: The patient's family history includes Brain cancer in his father; Breast cancer in his mother and sister; Heart attack in his sister; Lung cancer in his father.  ROS:   Please see the history of present illness.    All other systems reviewed and are negative.  EKGs/Labs/Other Studies Reviewed:    Cardiac Studies & Procedures   CARDIAC CATHETERIZATION  CARDIAC CATHETERIZATION 03/11/2022  Narrative   Mid RCA lesion is 20% stenosed.   1st Mrg lesion is 20% stenosed.   Mid LAD lesion is 20% stenosed.  Mild non-obstructive CAD Mild elevation LV filling pressure and wedge pressure  Recommendations: Medical management of CAD. Will have him increase his HCTZ to 25 mg daily. Continue workup for TAVR  Findings Coronary Findings Diagnostic  Dominance: Right  Left Anterior Descending Vessel is large. Mid LAD lesion is 20% stenosed.  Left Circumflex Vessel is moderate in size.  First Obtuse Marginal Branch 1st Mrg lesion is 20% stenosed.  Right Coronary Artery Vessel is large. Mid RCA lesion is 20% stenosed.  Intervention  No interventions have been documented.   STRESS TESTS  MYOCARDIAL PERFUSION IMAGING 01/10/2021  Narrative  The left ventricular ejection fraction is normal (55-65%).  Nuclear stress EF: 55%.  There was no ST segment deviation noted during stress.  The study is normal.  This is a low risk study.  Normal resting and stress perfusion. No ischemia or infarction EF 55%   ECHOCARDIOGRAM  ECHOCARDIOGRAM COMPLETE 06/02/2023  Narrative ECHOCARDIOGRAM REPORT    Patient Name:   Mitchell Humphrey  Date of Exam: 06/02/2023 Medical Rec #:  [id number]     Height:       69.0 in Accession #:    [id number]    Weight:       247.6 lb Date of Birth:  [birth year]    BSA:          2.262 m  Patient Age:    81 years      BP:           142/76 mmHg Patient Gender: M             HR:           70 bpm. Exam Location:  [address]  Procedure: 2D  Echo, Color Doppler, Cardiac Doppler and 3D Echo  Indications:    S/p TAVR Z95.2  History:        Patient has prior history of Echocardiogram examinations, most recent 06/24/2022. Risk Factors:Hypertension and Dyslipidemia. Aortic Valve: 29 mm Edwards Sapien prosthetic, stented (TAVR) valve is present in the aortic position. Procedure Date: 05/26/2022.  Sonographer:    Thurman Coyer RDCS Referring Phys: [id number] Brantlee Penn R Destaney Sarkis  IMPRESSIONS   1. Left ventricular ejection fraction, by estimation, is 60 to 65%. The left ventricle has normal function. The left ventricle has no regional wall motion abnormalities. Left ventricular diastolic parameters were normal. 2. Right ventricular systolic function is normal. The right ventricular size is normal. 3. Left atrial size was mildly dilated. 4. The mitral valve is normal in structure. No evidence of mitral valve regurgitation. No evidence of mitral stenosis. 5. The aortic valve has been repaired/replaced. Aortic valve regurgitation is not visualized. No aortic stenosis is present. There is a 29 mm Edwards Sapien prosthetic (TAVR) valve present in the aortic position. Procedure Date: 05/26/2022. Aortic valve mean gradient measures 8.0 mmHg. Aortic valve Vmax measures 1.92 m/s. 6. The inferior vena cava is normal in size with greater than 50% respiratory variability, suggesting right atrial pressure of 3 mmHg.  FINDINGS Left Ventricle: Left ventricular ejection fraction, by estimation, is 60 to 65%. The left ventricle has normal function. The left ventricle has no regional wall motion abnormalities. The left ventricular internal cavity size was normal in size. There is no left ventricular hypertrophy. Left ventricular diastolic parameters were normal.  Right Ventricle: The right ventricular size is normal. No increase in right ventricular wall thickness. Right ventricular systolic function is normal.  Left Atrium: Left atrial size was mildly  dilated.  Right Atrium: Right atrial size was normal in size.  Pericardium: There is no evidence of pericardial effusion.  Mitral Valve: The mitral valve is normal in structure. No evidence of mitral valve regurgitation. No evidence of mitral valve stenosis.  Tricuspid Valve: The tricuspid valve is normal in structure. Tricuspid valve regurgitation is not demonstrated. No evidence of tricuspid stenosis.  Aortic Valve: The aortic valve has been repaired/replaced. Aortic valve regurgitation is not visualized. No aortic stenosis is present. Aortic valve mean gradient measures 8.0 mmHg. Aortic valve peak gradient measures 14.7 mmHg. Aortic valve area, by VTI measures 2.99 cm. There is a 29 mm Edwards Sapien prosthetic, stented (TAVR) valve present in the aortic position. Procedure Date: 05/26/2022.  Pulmonic Valve: The pulmonic valve was normal in structure. Pulmonic valve regurgitation is not visualized. No evidence of pulmonic stenosis.  Aorta: The aortic root is normal in size and structure.  Venous: The inferior vena cava is normal in size with greater than 50% respiratory variability, suggesting right atrial pressure of 3 mmHg.  IAS/Shunts: No atrial level shunt detected by color flow Doppler.   LEFT VENTRICLE PLAX 2D LVIDd:         4.20 cm   Diastology LVIDs:         3.10 cm   LV e' medial:    7.89 cm/s LV PW:  1.00 cm   LV E/e' medial:  12.6 LV IVS:        1.00 cm   LV e' lateral:   9.64 cm/s LVOT diam:     2.70 cm   LV E/e' lateral: 10.4 LV SV:         131 LV SV Index:   58 LVOT Area:     5.73 cm  3D Volume EF: 3D EF:        60 % LV EDV:       205 ml LV ESV:       82 ml LV SV:        123 ml  RIGHT VENTRICLE RV Basal diam:  3.30 cm RV Mid diam:    3.70 cm RV S prime:     11.90 cm/s TAPSE (M-mode): 2.7 cm  LEFT ATRIUM              Index        RIGHT ATRIUM           Index LA diam:        4.40 cm  1.94 cm/m   RA Area:     18.10 cm LA Vol (A2C):   132.0 ml  58.35 ml/m  RA Volume:   42.70 ml  18.88 ml/m LA Vol (A4C):   63.0 ml  27.85 ml/m LA Biplane Vol: 90.7 ml  40.09 ml/m AORTIC VALVE AV Area (Vmax):    2.83 cm AV Area (Vmean):   2.68 cm AV Area (VTI):     2.99 cm AV Vmax:           192.00 cm/s AV Vmean:          133.000 cm/s AV VTI:            0.438 m AV Peak Grad:      14.7 mmHg AV Mean Grad:      8.0 mmHg LVOT Vmax:         95.00 cm/s LVOT Vmean:        62.200 cm/s LVOT VTI:          0.229 m LVOT/AV VTI ratio: 0.52  AORTA Ao Root diam: 3.60 cm Ao Asc diam:  3.60 cm  MITRAL VALVE MV Area (PHT): 2.95 cm    SHUNTS MV Decel Time: 257 msec    Systemic VTI:  0.23 m MV E velocity: 99.80 cm/s  Systemic Diam: 2.70 cm MV A velocity: 91.40 cm/s MV E/A ratio:  1.09  Aditya Sabharwal Electronically signed by Dorthula Nettles Signature Date/Time: 06/02/2023/1:47:13 PM    Final    MONITORS  LONG TERM MONITOR-LIVE TELEMETRY (3-14 DAYS) 06/18/2022  Narrative Patch Wear Time:  14 days and 9 hours (2023-07-12T10:20:08-399 to 2023-07-26T19:57:20-0400)  Monitor 1 Patient had a min HR of 71 bpm (normal sinus rhythm), max HR of 174 bpm (3.4 second duration supraventricular tachycardia), and avg HR of 94 bpm (normal sinus rhythm). Predominant underlying rhythm was Sinus Rhythm. 7 Supraventricular Tachycardia runs occurred, the run with the fastest interval lasting 9 beats (3.4 second duration) with a max rate of 174 bpm (avg 156 bpm); the run with the fastest interval was also the longest. Junctional Rhythm was present. Isolated SVEs were rare (<1.0%), SVE Couplets were rare (<1.0%), and no SVE Triplets were present. Isolated VEs were occasional (1.7%, 7424), VE Couplets were rare (<1.0%, 302), and VE Triplets were rare (<1.0%, 105). Ventricular Bigeminy and Trigeminy were present.  Monitor 2 Patient had a min HR  of 56 bpm (sinus bradycardia), max HR of 203 bpm (1.2 second duration supraventricular tachycardia), and avg HR of 77 bpm  (normal sinus rhythm). Predominant underlying rhythm was Sinus Rhythm. 44 Supraventricular Tachycardia runs occurred, the run with the fastest interval lasting 4 beats with a max rate of 203 bpm (1.2 second duration), the longest lasting 20 beats (11.4 second duration) with an avg rate of 103 bpm. 1 Pause occurred lasting 3.2 secs (19 bpm). Isolated SVEs were rare (<1.0%), SVE Couplets were rare (<1.0%), and SVE Triplets were rare (<1.0%). Isolated VEs were frequent (7.3%, 89409), VE Couplets were rare (<1.0%, 4069), and VE Triplets were rare (<1.0%, 7). Ventricular Bigeminy and Trigeminy were present.  Impression: Brief supraventricular tachycardia detected (longest duration 11.4 seconds). Frequent PVCs (7.3%).  Gerri Spore T. Flora Lipps, MD, [facility]  [facility] [address] [place], [place] [address] [phone] 7:45 PM   CT SCANS  CT CORONARY MORPH W/CTA COR W/SCORE 03/27/2022  Addendum 03/28/2022  4:22 PM ADDENDUM REPORT: 03/28/2022 16:20  CLINICAL DATA:  Severe Aortic Stenosis.  EXAM: Cardiac TAVR CT  TECHNIQUE: A non-contrast, gated CT scan was obtained with axial slices of 3 mm through the heart for aortic valve calcium scoring. A 120 kV retrospective, gated, contrast cardiac scan was obtained. Gantry rotation speed was 250 msecs and collimation was 0.6 mm. Nitroglycerin was not given. The 3D data set was reconstructed in 5% intervals of the 0-95% of the R-R cycle. Systolic and diastolic phases were analyzed on a dedicated workstation using MPR, MIP, and VRT modes. The patient received 100 cc of contrast.  FINDINGS: Image quality: Excellent.  Noise artifact is: Limited.  Valve Morphology: Tricuspid aortic valve with diffuse severe calcifications. Severely restricted leaflet motion in systole.  Aortic Valve Calcium score: 1862  Aortic annular dimension:  Phase assessed: 30%  Annular area: 601 mm2  Annular perimeter: 89.0 mm  Max diameter:  30.2 mm  Min diameter: 26.3 mm  Annular and subannular calcification: None.  Membranous septum length: 11.5 mm  Optimal coplanar projection: RAO 4 CAU 7  Coronary Artery Height above Annulus:  Left Main: 13.0 mm  Right Coronary: 19.5 mm  Sinus of Valsalva Measurements:  Non-coronary: 32 mm  Right-coronary: 35 mm  Left-coronary: 34 mm  Sinus of Valsalva Height:  Non-coronary: 22.8 mm  Right-coronary: 23.8 mm  Left-coronary: 22.0 mm  Sinotubular Junction: 31 mm  Ascending Thoracic Aorta: 35 mm  Coronary Arteries: Normal coronary origin. Right dominance. The study was performed without use of NTG and is insufficient for plaque evaluation. Please refer to recent cardiac catheterization for coronary assessment.  Cardiac Morphology:  Right Atrium: Right atrial size is within normal limits.  Right Ventricle: The right ventricular cavity is within normal limits.  Left Atrium: Left atrial size is normal in size with no left atrial appendage filling defect.  Left Ventricle: The ventricular cavity size is within normal limits. There are no stigmata of prior infarction.  Pulmonary arteries: Mild dilation of the main pulmonary artery suggestive of pulmonary hypertension.  Pulmonary veins: Normal pulmonary venous drainage.  Pericardium: Normal thickness with no significant effusion or calcium present.  Mitral Valve: The mitral valve is normal structure without significant calcification.  Extra-cardiac findings: See attached radiology report for non-cardiac structures.  IMPRESSION: 1. Annular measurements support a 29 mm S3 (601 mm2).  2. No significant annular or subannular calcifications.  3. Sufficient coronary to annulus distance.  4. Optimal Fluoroscopic Angle for Delivery: RAO 4 CAU 7  5. Mild dilation of the main pulmonary artery suggestive of pulmonary hypertension.  Gerri Spore T. Flora Lipps, MD   Electronically Signed By: Lennie Odor M.D. On:  03/28/2022 16:20  Narrative EXAM: OVER-READ INTERPRETATION  CT CHEST  The following report is a limited chest CT over-read performed by radiologist Dr. Allegra Lai of [facility] on 03/27/2022. This over-read does not include interpretation of cardiac or coronary anatomy or pathology This over-read does not include interpretation of cardiac or coronary anatomy or pathology. The cardiac CTA interpretation by the cardiologist is attached.  COMPARISON:  None Available.  FINDINGS: Extracardiac findings will be described separately under dictation for contemporaneously obtained CTA chest, abdomen and pelvis.  IMPRESSION: Please see separate dictation for contemporaneously obtained CTA chest, abdomen and pelvis dated 03/27/2022 for full description of relevant extracardiac findings.  Electronically Signed: By: Allegra Lai M.D. On: 03/27/2022 11:26   CT SCANS  CT CARDIAC SCORING (SELF PAY ONLY) 02/23/2022  Addendum 03/06/2022  4:48 PM ADDENDUM REPORT: 03/06/2022 16:46  EXAM: OVER-READ INTERPRETATION  CT CHEST  The following report is an over-read performed by radiologist Dr. Jeronimo Greaves of [facility] on 03/06/2022. This over-read does not include interpretation of cardiac or coronary anatomy or pathology. The calcium score interpretation by the cardiologist is attached.  COMPARISON:  02/10/2021 chest radiograph.  12/15/2020 CTA chest  FINDINGS: Vascular: Aortic atherosclerosis.  Aortic valve calcification.  Pulmonary artery enlargement, outflow tract 3.5 cm  Mediastinum/Nodes: No imaged thoracic adenopathy.  Lungs/Pleura: No pleural fluid. 2 mm right lower lobe pulmonary nodule on 24/4 is at the site of airspace disease on the prior exam.  2-3 mm left lower lobe pulmonary nodule on 23/4 is not readily apparent on the prior.  Nodule along the left major fissure of 2 mm on 42/4 is at the site of airspace disease previously.  Areas  of peripheral predominant mild ground-glass and architectural distortion bilaterally.  Upper Abdomen: Normal minimally imaged liver.  Musculoskeletal: No acute osseous abnormality. Thoracic spondylosis.  IMPRESSION: 1. 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 2. Tiny pulmonary nodules are either new or were obscured on the prior exam by airspace disease. No follow-up needed if patient is low-risk. Non-contrast chest CT can be considered in 12 months if patient is high-risk. This recommendation follows the consensus statement: Guidelines for Management of Incidental Pulmonary Nodules Detected on CT Images: From the Fleischner Society 2017; Radiology 2017; 284:228-243. 3.  Aortic Atherosclerosis (ICD10-I70.0). 4. Aortic valvular calcifications. Consider echocardiography to evaluate for valvular dysfunction. 5. Pulmonary artery enlargement suggests pulmonary arterial hypertension.   Electronically Signed By: Jeronimo Greaves M.D. On: 03/06/2022 16:46  Narrative CLINICAL DATA:  Cardiovascular Disease Risk stratification  EXAM: Coronary Calcium Score  TECHNIQUE: A gated, non-contrast computed tomography scan of the heart was performed using 3mm slice thickness. Axial images were analyzed on a dedicated workstation. Calcium scoring of the coronary arteries was performed using the Agatston method.  FINDINGS: Aortic Valve Calcium Score: 2428  Coronary Calcium Score:  Left main: 0  Left anterior descending artery: 599  Left circumflex artery: 31  Right coronary artery: 970  Total: 1599  Percentile: 81st  Pericardium: Normal.  Non-cardiac: See separate report from [facility].  IMPRESSION: 1. Coronary calcium score of 1599. This was 81st percentile for age-, race-, and sex-matched controls.  2. Severe aortic stenosis (Calcium score 2428).  RECOMMENDATIONS: Coronary  artery calcium (CAC) score is a strong predictor of incident  coronary heart disease (CHD) and provides predictive information beyond traditional risk factors. CAC scoring is reasonable to use in the decision to withhold, postpone, or initiate statin therapy in intermediate-risk or selected borderline-risk asymptomatic adults (age 98-75 years and LDL-C >=70 to <190 mg/dL) who do not have diabetes or established atherosclerotic cardiovascular disease (ASCVD).* In intermediate-risk (10-year ASCVD risk >=7.5% to <20%) adults or selected borderline-risk (10-year ASCVD risk >=5% to <7.5%) adults in whom a CAC score is measured for the purpose of making a treatment decision the following recommendations have been made:  If CAC=0, it is reasonable to withhold statin therapy and reassess in 5 to 10 years, as long as higher risk conditions are absent (diabetes mellitus, family history of premature CHD in first degree relatives (males <55 years; females <65 years), cigarette smoking, or LDL >=190 mg/dL).  If CAC is 1 to 99, it is reasonable to initiate statin therapy for patients >=33 years of age.  If CAC is >=100 or >=75th percentile, it is reasonable to initiate statin therapy at any age.  Cardiology referral should be considered for patients with CAC scores >=400 or >=75th percentile.  *2018 AHA/ACC/AACVPR/AAPA/ABC/ACPM/ADA/AGS/APhA/ASPC/NLA/PCNA Guideline on the Management of Blood Cholesterol: A Report of the American College of Cardiology/American Heart Association Task Force on Clinical Practice Guidelines. J Am Coll Cardiol. 2019;73(24):3168-3209.  Lennie Odor, MD  Electronically Signed: By: Lennie Odor M.D. On: 02/23/2022 16:11          EKG:  EKG is NOT ordered today.    Recent Labs: 12/11/2022: ALT 22; BUN 18; Creatinine, Ser 0.84; Hemoglobin 13.4; Platelets 226; Potassium 4.7; Sodium 145 01/19/2023: TSH 2.21  Recent Lipid Panel    Component Value Date/Time   CHOL  136 12/11/2022 1106   TRIG 120 12/11/2022 1106   HDL 41 12/11/2022 1106   CHOLHDL 3.3 12/11/2022 1106   CHOLHDL 4 07/14/2021 1113   VLDL 36.2 07/14/2021 1113   LDLCALC 73 12/11/2022 1106     Risk Assessment/Calculations:       Physical Exam:    VS:  BP (!) 148/70   Pulse 70   Ht 5\' 9"  (1.753 m)   Wt 244 lb (110.7 kg)   SpO2 97%   BMI 36.03 kg/m     Wt Readings from Last 3 Encounters:  06/02/23 244 lb (110.7 kg)  04/19/23 247 lb 9.6 oz (112.3 kg)  03/16/23 247 lb (112 kg)     GEN:  Well nourished, well developed in no acute distress HEENT: Normal NECK: No JVD LYMPHATICS: No lymphadenopathy CARDIAC: RRR, no murmurs, rubs, gallops RESPIRATORY:  Clear to auscultation without rales, wheezing or rhonchi  ABDOMEN: Soft, non-tender, non-distended MUSCULOSKELETAL:  No edema; No deformity  SKIN: Warm and dry NEUROLOGIC:  Alert and oriented x 3 PSYCHIATRIC:  Normal affect   ASSESSMENT:    1. S/P TAVR (transcatheter aortic valve replacement)   2. Primary hypertension   3. 1st degree AV block   4. Hypercholesterolemia   5. Chronic diastolic CHF (congestive heart failure) (HCC)    PLAN:    In order of problems listed above:  Severe AS s/p TAVR: echo today shows EF 60%, normally functioning TAVR with a mean gradient of 8 mm hg and no PVL.  He has NYHA class I symptoms. Continue aspirin alone. SBE prophylaxis discussed; he has azithromycin due to a PCN allergy. Continue regular follow up with Dr. Flora Lipps.   HTN: BP had been elevated and Dr. Flora Lipps stopped Enalapril stopped started on Losartan 50mg   BID in April.  I reviewed his home log which shows BP still under poor control with elevated SBPs averaging ~150. Continue spiro 25mg  daily and Enalapril 50mg  BID. Will update BMET today given increased ARB. Add Norvasc 5mg  daily and follow. He has an apt with Dr. Flora Lipps at the end of this month.    HLD: continue Lipitor    Chronic diastolic CHF: appears euvolemic today on exam.  Continue Spiro 25mg  daily.     Medication Adjustments/Labs and Tests Ordered: Current medicines are reviewed at length with the patient today.  Concerns regarding medicines are outlined above.  Orders Placed This Encounter  Procedures   Basic Metabolic Panel (BMET)   Meds ordered this encounter  Medications   amLODipine (NORVASC) 5 MG tablet    Sig: Take 1 tablet (5 mg total) by mouth daily.    Dispense:  90 tablet    Refill:  3    Patient Instructions  Medication Instructions:  Your physician has recommended you make the following change in your medication:  1.) start amlodipine 5 mg - take one tablet daily  *If you need a refill on your cardiac medications before your next appointment, please call your pharmacy*   Lab Work: Today: BMET If you have labs (blood work) drawn today and your tests are completely normal, you will receive your results only by: MyChart Message (if you have MyChart) OR A paper copy in the mail If you have any lab test that is abnormal or we need to change your treatment, we will call you to review the results.   Testing/Procedures: none   Follow-Up: As planned with Dr. Flora Lipps  Other Instructions Keep a list of your blood pressures and bring to appointment with Dr. Flora Lipps    Signed, Cline Crock, PA-C  06/02/2023 2:22 PM    [facility]

## 2023-06-02 ENCOUNTER — Ambulatory Visit: Payer: HMO | Attending: Cardiology | Admitting: Physician Assistant

## 2023-06-02 ENCOUNTER — Other Ambulatory Visit (HOSPITAL_COMMUNITY): Payer: Self-pay

## 2023-06-02 ENCOUNTER — Ambulatory Visit (HOSPITAL_BASED_OUTPATIENT_CLINIC_OR_DEPARTMENT_OTHER): Payer: HMO

## 2023-06-02 VITALS — BP 148/70 | HR 70 | Ht 69.0 in | Wt 244.0 lb

## 2023-06-02 DIAGNOSIS — I10 Essential (primary) hypertension: Secondary | ICD-10-CM | POA: Diagnosis not present

## 2023-06-02 DIAGNOSIS — I5032 Chronic diastolic (congestive) heart failure: Secondary | ICD-10-CM | POA: Diagnosis not present

## 2023-06-02 DIAGNOSIS — I440 Atrioventricular block, first degree: Secondary | ICD-10-CM | POA: Diagnosis not present

## 2023-06-02 DIAGNOSIS — Z952 Presence of prosthetic heart valve: Secondary | ICD-10-CM

## 2023-06-02 DIAGNOSIS — E7800 Pure hypercholesterolemia, unspecified: Secondary | ICD-10-CM | POA: Diagnosis not present

## 2023-06-02 LAB — ECHOCARDIOGRAM COMPLETE
AR max vel: 2.83 cm2
AV Area VTI: 2.99 cm2
AV Area mean vel: 2.68 cm2
AV Mean grad: 8 mmHg
AV Peak grad: 14.7 mmHg
Ao pk vel: 1.92 m/s
Area-P 1/2: 2.95 cm2
S' Lateral: 3.1 cm

## 2023-06-02 MED ORDER — AMLODIPINE BESYLATE 5 MG PO TABS
5.0000 mg | ORAL_TABLET | Freq: Every day | ORAL | 3 refills | Status: DC
  Filled 2023-06-02: qty 90, 90d supply, fill #0
  Filled 2023-08-26 – 2023-08-27 (×2): qty 90, 90d supply, fill #1

## 2023-06-02 NOTE — Patient Instructions (Signed)
Medication Instructions:  Your physician has recommended you make the following change in your medication:  1.) start amlodipine 5 mg - take one tablet daily  *If you need a refill on your cardiac medications before your next appointment, please call your pharmacy*   Lab Work: Today: BMET If you have labs (blood work) drawn today and your tests are completely normal, you will receive your results only by: MyChart Message (if you have MyChart) OR A paper copy in the mail If you have any lab test that is abnormal or we need to change your treatment, we will call you to review the results.   Testing/Procedures: none   Follow-Up: As planned with Dr. Flora Lipps  Other Instructions Keep a list of your blood pressures and bring to appointment with Dr. Flora Lipps

## 2023-06-03 LAB — BASIC METABOLIC PANEL
BUN/Creatinine Ratio: 23 (ref 10–24)
BUN: 21 mg/dL (ref 8–27)
CO2: 23 mmol/L (ref 20–29)
Calcium: 10 mg/dL (ref 8.6–10.2)
Chloride: 101 mmol/L (ref 96–106)
Creatinine, Ser: 0.9 mg/dL (ref 0.76–1.27)
Glucose: 70 mg/dL (ref 70–99)
Potassium: 4.7 mmol/L (ref 3.5–5.2)
Sodium: 141 mmol/L (ref 134–144)
eGFR: 86 mL/min/{1.73_m2} (ref >59)

## 2023-06-07 DIAGNOSIS — M5431 Sciatica, right side: Secondary | ICD-10-CM | POA: Diagnosis not present

## 2023-06-07 DIAGNOSIS — M25651 Stiffness of right hip, not elsewhere classified: Secondary | ICD-10-CM | POA: Diagnosis not present

## 2023-06-07 DIAGNOSIS — M25551 Pain in right hip: Secondary | ICD-10-CM | POA: Diagnosis not present

## 2023-06-07 DIAGNOSIS — R262 Difficulty in walking, not elsewhere classified: Secondary | ICD-10-CM | POA: Diagnosis not present

## 2023-06-07 DIAGNOSIS — R293 Abnormal posture: Secondary | ICD-10-CM | POA: Diagnosis not present

## 2023-06-09 DIAGNOSIS — R293 Abnormal posture: Secondary | ICD-10-CM | POA: Diagnosis not present

## 2023-06-09 DIAGNOSIS — M5431 Sciatica, right side: Secondary | ICD-10-CM | POA: Diagnosis not present

## 2023-06-09 DIAGNOSIS — R262 Difficulty in walking, not elsewhere classified: Secondary | ICD-10-CM | POA: Diagnosis not present

## 2023-06-09 DIAGNOSIS — M25651 Stiffness of right hip, not elsewhere classified: Secondary | ICD-10-CM | POA: Diagnosis not present

## 2023-06-09 DIAGNOSIS — M25551 Pain in right hip: Secondary | ICD-10-CM | POA: Diagnosis not present

## 2023-06-13 NOTE — Progress Notes (Unsigned)
Cardiology Office Note:   Date:  06/16/2023  NAME:  Mitchell Humphrey    MRN: [id number] DOB:  06/27/1941   PCP:  Mitchell Motto, PA  Cardiologist:  Mitchell Harps, MD  Electrophysiologist:  None   Referring MD: Mitchell Motto, PA   Chief Complaint  Patient presents with   Follow-up         History of Present Illness:   Mitchell Humphrey is a 82 y.o. male with a hx of TAVR, DVT, HTN, HLD, CAD who presents for follow-up.  Currently on Norvasc 5 mg daily, losartan 50 mg twice daily, Aldactone 25 mg daily.  Blood pressure is well-controlled.  Lipids could be a bit better.  We discussed increasing his Lipitor to 40 mg daily.  He is okay to do this.  Denies any chest pains or trouble breathing.  Trying to stay active but that he does preclude that.  Blood pressure values are ranging between 130-150.  Most of his values are less than 140.  He is watching his diet.  He is working on losing weight.  Overall seems to be doing well.  No symptoms of PAD.  No symptoms of angina today.  Problem List 1. DVT -L gastrocnemius vein 12/17/2019 -in setting of Covid PNA 2. 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 HTN 4. HLD -T chol 136, HDL 41, LDL 73, TG 120 5.  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 DM -A1c 6.3 7. PAD -Severe right common femoral artery atherosclerosis on CTA -ABI negative 05/15/2022    Past Medical History: Past Medical History:  Diagnosis Date   Allergy    Arthritis    BPH (benign prostatic hyperplasia)    Cataract    DVT (deep venous thrombosis) (HCC) 12/17/2019   left gastrocnemius DVT in setting of COVID-19   Dyslipidemia    Erectile dysfunction    GERD (gastroesophageal reflux disease)    Hx of adenomatous colonic polyps    Hyperlipidemia    Hypertension    Hypogonadism male    Hypothyroid    Obesity    S/P TAVR (transcatheter aortic valve replacement) 05/26/2022   S3UR 29mm vias TF approach with Dr. Clifton James  and Dr. Laneta Simmers   Severe aortic stenosis     Past Surgical History: Past Surgical History:  Procedure Laterality Date   APPENDECTOMY     COLONOSCOPY  08/2008   Dr. Ewing Schlein   HERNIA REPAIR  1970   [facility]   INTRAOPERATIVE TRANSTHORACIC ECHOCARDIOGRAM N/A 05/26/2022   Procedure: INTRAOPERATIVE TRANSTHORACIC ECHOCARDIOGRAM;  Surgeon: Mitchell Hazel, MD;  Location: [facility];  Service: Open Heart Surgery;  Laterality: N/A;   RIGHT HEART CATH AND CORONARY ANGIOGRAPHY N/A 03/11/2022   Procedure: RIGHT HEART CATH AND CORONARY ANGIOGRAPHY;  Surgeon: Mitchell Hazel, MD;  Location: [facility];  Service: Cardiovascular;  Laterality: N/A;   TRANSCATHETER AORTIC VALVE REPLACEMENT, TRANSFEMORAL N/A 05/26/2022   Procedure: Transcatheter Aortic Valve Replacement, Transfemoral;  Surgeon: Mitchell Hazel, MD;  Location: [facility];  Service: Open Heart Surgery;  Laterality: N/A;    Current Medications: No outpatient medications have been marked as taking for the 06/16/23 encounter (Office Visit) with Flora Lipps, Ronnald Ramp, MD.     Allergies:    Penicillins   Social History: Social History   Socioeconomic History   Marital status: Married    Spouse name: Not on file   Number of children: 3  Years of education: Not on file   Highest education level: Not on file  Occupational History   Occupation: Administrator, Civil Service   Occupation: Retired-Construction  Tobacco Use   Smoking status: Never   Smokeless tobacco: Never   Tobacco comments:    He chews on a cigar  Vaping Use   Vaping status: Never Used  Substance and Sexual Activity   Alcohol use: No   Drug use: No   Sexual activity: Not Currently  Other Topics Concern   Not on file  Social History Narrative   Superintendent for construction site   Lives with wife   2 step children   2 sons   Social Determinants of Health   Financial Resource Strain: Low Risk  (10/20/2022)   Overall  Financial Resource Strain (CARDIA)    Difficulty of Paying Living Expenses: Not hard at all  Food Insecurity: Low Risk  (02/23/2023)   [facility]   Food vital sign    Within the past 12 months, you worried that your food would run out before you got money to buy more: Never true    Within the past 12 months, the food you bought just didn't last and you didn't have money to get more. : Never true  Transportation Needs: No Transportation Needs (02/23/2023)   [facility]   Transportation    In the past 12 months, has lack of reliable transportation kept you from medical appointments, meetings, work or from getting things needed for daily living? : No  Physical Activity: Sufficiently Active (10/20/2022)   Exercise Vital Sign    Days of Exercise per Week: 7 days    Minutes of Exercise per Session: 30 min  Stress: No Stress Concern Present (10/20/2022)   [employer] of Occupational Health - Occupational Stress Questionnaire    Feeling of Stress : Not at all  Social Connections: Moderately Isolated (10/20/2022)   Social Connection and Isolation Panel [NHANES]    Frequency of Communication with Friends and Family: More than three times a week    Frequency of Social Gatherings with Friends and Family: More than three times a week    Attends Religious Services: Never    Database administrator or Organizations: No    Attends Engineer, structural: Never    Marital Status: Married     Family History: The patient's family history includes Brain cancer in his father; Breast cancer in his mother and sister; Heart attack in his sister; Lung cancer in his father.  ROS:   All other ROS reviewed and negative. Pertinent positives noted in the HPI.     EKGs/Labs/Other Studies Reviewed:   The following studies were personally reviewed by me today:  EKG:  EKG is not ordered today.        TTE 06/02/2023  1. Left ventricular  ejection fraction, by estimation, is 60 to 65%. The  left ventricle has normal function. The left ventricle has no regional  wall motion abnormalities. Left ventricular diastolic parameters were  normal.   2. Right ventricular systolic function is normal. The right ventricular  size is normal.   3. Left atrial size was mildly dilated.   4. The mitral valve is normal in structure. No evidence of mitral valve  regurgitation. No evidence of mitral stenosis.   5. The aortic valve has been repaired/replaced. Aortic valve  regurgitation is not visualized. No aortic stenosis is present. There is a  29  mm Edwards Sapien prosthetic (TAVR) valve present in the aortic  position. Procedure Date: 05/26/2022. Aortic valve  mean gradient measures 8.0 mmHg. Aortic valve Vmax measures 1.92 m/s.   6. The inferior vena cava is normal in size with greater than 50%  respiratory variability, suggesting right atrial pressure of 3 mmHg.    Recent Labs: 12/11/2022: ALT 22; Hemoglobin 13.4; Platelets 226 01/19/2023: TSH 2.21 06/02/2023: BUN 21; Creatinine, Ser 0.90; Potassium 4.7; Sodium 141   Recent Lipid Panel    Component Value Date/Time   CHOL 136 12/11/2022 1106   TRIG 120 12/11/2022 1106   HDL 41 12/11/2022 1106   CHOLHDL 3.3 12/11/2022 1106   CHOLHDL 4 07/14/2021 1113   VLDL 36.2 07/14/2021 1113   LDLCALC 73 12/11/2022 1106    Physical Exam:   VS:  BP 126/86   Pulse 63   Ht 5\' 9"  (1.753 m)   Wt 248 lb 12.8 oz (112.9 kg)   SpO2 98%   BMI 36.74 kg/m    Wt Readings from Last 3 Encounters:  06/16/23 248 lb 12.8 oz (112.9 kg)  06/02/23 244 lb (110.7 kg)  04/19/23 247 lb 9.6 oz (112.3 kg)    General: Well nourished, well developed, in no acute distress Head: Atraumatic, normal size  Eyes: PEERLA, EOMI  Neck: Supple, no JVD Endocrine: No thryomegaly Cardiac: Normal S1, S2; RRR; no murmurs, rubs, or gallops Lungs: Clear to auscultation bilaterally, no wheezing, rhonchi or rales  Abd: Soft,  nontender, no hepatomegaly  Ext: No edema, pulses 2+ Musculoskeletal: No deformities, BUE and BLE strength normal and equal Skin: Warm and dry, no rashes   Neuro: Alert and oriented to person, place, time, and situation, CNII-XII grossly intact, no focal deficits  Psych: Normal mood and affect   ASSESSMENT:   TILDON BAUER is a 82 y.o. male who presents for the following: 1. S/P TAVR (transcatheter aortic valve replacement)   2. Hypercholesterolemia   3. Primary hypertension   4. Coronary artery disease involving native coronary artery of native heart without angina pectoris     PLAN:   1. S/P TAVR (transcatheter aortic valve replacement) -Status post TAVR last year.  Stable gradients.  He will continue aspirin.  He understands he needs antibiotics before dental work.  Overall doing quite well.  2. Hypercholesterolemia -Lipids could be a bit better.  LDL goal less than 70.  Increase Lipitor to 40 mg daily.  He will have his physical done in the next few months with his primary care physician.  3. Primary hypertension -BP goal less than 140/90.  Well-controlled on Norvasc 5 mg daily, losartan 50 mg twice daily, Aldactone 25 mg daily.  No change to medications.  I encouraged salt reduction and exercise.  4. Coronary artery disease involving native coronary artery of native heart without angina pectoris -Nonobstructive CAD.  On aspirin.  Blood pressure much better.  Increase Lipitor today.  No symptoms of angina.  Disposition: Return in about 1 year (around 06/15/2024).  Medication Adjustments/Labs and Tests Ordered: Current medicines are reviewed at length with the patient today.  Concerns regarding medicines are outlined above.  No orders of the defined types were placed in this encounter.  Meds ordered this encounter  Medications   atorvastatin (LIPITOR) 20 MG tablet    Sig: Take 1 tablet (20 mg total) by mouth in the morning and at bedtime.    Dispense:  180 tablet    Refill:  3    Patient Instructions  Medication Instructions:  Begin the Lipitor 40 mg once daily.Additional medication has been sent to your pharmacy *If you need a refill on your cardiac medications before your next appointment, please call your pharmacy*   Lab Work: none If you have labs (blood work) drawn today and your tests are completely normal, you will receive your results only by: MyChart Message (if you have MyChart) OR A paper copy in the mail If you have any lab test that is abnormal or we need to change your treatment, we will call you to review the results.   Testing/Procedures: none  Follow-Up: At [facility], you and your health needs are our priority.  As part of our continuing mission to provide you with exceptional heart care, we have created designated Provider Care Teams.  These Care Teams include your primary Cardiologist (physician) and Advanced Practice Providers (APPs -  Physician Assistants and Nurse Practitioners) who all work together to provide you with the care you need, when you need it.  Your next appointment:   1 year(s)  Provider:   Reatha Harps, MD       Time Spent with Patient: I have spent a total of 25 minutes with patient reviewing hospital notes, telemetry, EKGs, labs and examining the patient as well as establishing an assessment and plan that was discussed with the patient.  > 50% of time was spent in direct patient care.  Signed, Lenna Gilford. Flora Lipps, MD, [facility]  [facility]  [address], [address] [place], [place] [address] [phone]  06/16/2023 10:27 AM

## 2023-06-14 ENCOUNTER — Other Ambulatory Visit (HOSPITAL_COMMUNITY): Payer: Self-pay

## 2023-06-15 ENCOUNTER — Ambulatory Visit: Payer: HMO | Admitting: Physician Assistant

## 2023-06-15 ENCOUNTER — Other Ambulatory Visit (HOSPITAL_COMMUNITY): Payer: Self-pay

## 2023-06-16 ENCOUNTER — Other Ambulatory Visit (HOSPITAL_COMMUNITY): Payer: Self-pay

## 2023-06-16 ENCOUNTER — Ambulatory Visit: Payer: HMO | Admitting: Cardiovascular Disease

## 2023-06-16 ENCOUNTER — Encounter: Payer: Self-pay | Admitting: Cardiovascular Disease

## 2023-06-16 VITALS — BP 126/86 | HR 63 | Ht 69.0 in | Wt 248.8 lb

## 2023-06-16 DIAGNOSIS — E7800 Pure hypercholesterolemia, unspecified: Secondary | ICD-10-CM | POA: Diagnosis not present

## 2023-06-16 DIAGNOSIS — Z952 Presence of prosthetic heart valve: Secondary | ICD-10-CM

## 2023-06-16 DIAGNOSIS — I2510 Atherosclerotic heart disease of native coronary artery without angina pectoris: Secondary | ICD-10-CM | POA: Diagnosis not present

## 2023-06-16 DIAGNOSIS — I10 Essential (primary) hypertension: Secondary | ICD-10-CM

## 2023-06-16 MED ORDER — ATORVASTATIN CALCIUM 20 MG PO TABS
20.0000 mg | ORAL_TABLET | Freq: Two times a day (BID) | ORAL | 3 refills | Status: DC
  Filled 2023-06-16 – 2023-07-05 (×2): qty 180, 90d supply, fill #0
  Filled 2023-10-15: qty 180, 90d supply, fill #1
  Filled 2024-01-17: qty 180, 90d supply, fill #2
  Filled 2024-04-18: qty 180, 90d supply, fill #3

## 2023-06-16 NOTE — Patient Instructions (Signed)
Medication Instructions:  Begin the Lipitor 40 mg once daily.Additional medication has been sent to your pharmacy *If you need a refill on your cardiac medications before your next appointment, please call your pharmacy*   Lab Work: none If you have labs (blood work) drawn today and your tests are completely normal, you will receive your results only by: MyChart Message (if you have MyChart) OR A paper copy in the mail If you have any lab test that is abnormal or we need to change your treatment, we will call you to review the results.   Testing/Procedures: none  Follow-Up: At [facility], you and your health needs are our priority.  As part of our continuing mission to provide you with exceptional heart care, we have created designated Provider Care Teams.  These Care Teams include your primary Cardiologist (physician) and Advanced Practice Providers (APPs -  Physician Assistants and Nurse Practitioners) who all work together to provide you with the care you need, when you need it.  Your next appointment:   1 year(s)  Provider:   Reatha Harps, MD

## 2023-06-17 DIAGNOSIS — R293 Abnormal posture: Secondary | ICD-10-CM | POA: Diagnosis not present

## 2023-06-17 DIAGNOSIS — M25651 Stiffness of right hip, not elsewhere classified: Secondary | ICD-10-CM | POA: Diagnosis not present

## 2023-06-17 DIAGNOSIS — M25551 Pain in right hip: Secondary | ICD-10-CM | POA: Diagnosis not present

## 2023-06-17 DIAGNOSIS — M5431 Sciatica, right side: Secondary | ICD-10-CM | POA: Diagnosis not present

## 2023-06-17 DIAGNOSIS — R262 Difficulty in walking, not elsewhere classified: Secondary | ICD-10-CM | POA: Diagnosis not present

## 2023-06-19 ENCOUNTER — Other Ambulatory Visit (HOSPITAL_COMMUNITY): Payer: Self-pay

## 2023-06-21 DIAGNOSIS — M25551 Pain in right hip: Secondary | ICD-10-CM | POA: Diagnosis not present

## 2023-06-21 DIAGNOSIS — R262 Difficulty in walking, not elsewhere classified: Secondary | ICD-10-CM | POA: Diagnosis not present

## 2023-06-21 DIAGNOSIS — M5431 Sciatica, right side: Secondary | ICD-10-CM | POA: Diagnosis not present

## 2023-06-21 DIAGNOSIS — R293 Abnormal posture: Secondary | ICD-10-CM | POA: Diagnosis not present

## 2023-06-21 DIAGNOSIS — M25651 Stiffness of right hip, not elsewhere classified: Secondary | ICD-10-CM | POA: Diagnosis not present

## 2023-06-23 DIAGNOSIS — R262 Difficulty in walking, not elsewhere classified: Secondary | ICD-10-CM | POA: Diagnosis not present

## 2023-06-23 DIAGNOSIS — R293 Abnormal posture: Secondary | ICD-10-CM | POA: Diagnosis not present

## 2023-06-23 DIAGNOSIS — M25651 Stiffness of right hip, not elsewhere classified: Secondary | ICD-10-CM | POA: Diagnosis not present

## 2023-06-23 DIAGNOSIS — M25551 Pain in right hip: Secondary | ICD-10-CM | POA: Diagnosis not present

## 2023-06-23 DIAGNOSIS — M5431 Sciatica, right side: Secondary | ICD-10-CM | POA: Diagnosis not present

## 2023-06-28 DIAGNOSIS — M25551 Pain in right hip: Secondary | ICD-10-CM | POA: Diagnosis not present

## 2023-06-28 DIAGNOSIS — R293 Abnormal posture: Secondary | ICD-10-CM | POA: Diagnosis not present

## 2023-06-28 DIAGNOSIS — M25651 Stiffness of right hip, not elsewhere classified: Secondary | ICD-10-CM | POA: Diagnosis not present

## 2023-06-28 DIAGNOSIS — M5431 Sciatica, right side: Secondary | ICD-10-CM | POA: Diagnosis not present

## 2023-06-28 DIAGNOSIS — R262 Difficulty in walking, not elsewhere classified: Secondary | ICD-10-CM | POA: Diagnosis not present

## 2023-06-29 DIAGNOSIS — J343 Hypertrophy of nasal turbinates: Secondary | ICD-10-CM | POA: Diagnosis not present

## 2023-06-29 DIAGNOSIS — J322 Chronic ethmoidal sinusitis: Secondary | ICD-10-CM | POA: Diagnosis not present

## 2023-06-29 DIAGNOSIS — J342 Deviated nasal septum: Secondary | ICD-10-CM | POA: Diagnosis not present

## 2023-06-29 DIAGNOSIS — H9201 Otalgia, right ear: Secondary | ICD-10-CM | POA: Diagnosis not present

## 2023-06-30 DIAGNOSIS — R262 Difficulty in walking, not elsewhere classified: Secondary | ICD-10-CM | POA: Diagnosis not present

## 2023-06-30 DIAGNOSIS — M25551 Pain in right hip: Secondary | ICD-10-CM | POA: Diagnosis not present

## 2023-06-30 DIAGNOSIS — M5431 Sciatica, right side: Secondary | ICD-10-CM | POA: Diagnosis not present

## 2023-06-30 DIAGNOSIS — R293 Abnormal posture: Secondary | ICD-10-CM | POA: Diagnosis not present

## 2023-06-30 DIAGNOSIS — M25651 Stiffness of right hip, not elsewhere classified: Secondary | ICD-10-CM | POA: Diagnosis not present

## 2023-07-05 ENCOUNTER — Other Ambulatory Visit (HOSPITAL_COMMUNITY): Payer: Self-pay

## 2023-07-05 ENCOUNTER — Other Ambulatory Visit: Payer: Self-pay

## 2023-07-05 DIAGNOSIS — M25551 Pain in right hip: Secondary | ICD-10-CM | POA: Diagnosis not present

## 2023-07-05 DIAGNOSIS — R293 Abnormal posture: Secondary | ICD-10-CM | POA: Diagnosis not present

## 2023-07-05 DIAGNOSIS — M25651 Stiffness of right hip, not elsewhere classified: Secondary | ICD-10-CM | POA: Diagnosis not present

## 2023-07-05 DIAGNOSIS — R262 Difficulty in walking, not elsewhere classified: Secondary | ICD-10-CM | POA: Diagnosis not present

## 2023-07-05 DIAGNOSIS — M5431 Sciatica, right side: Secondary | ICD-10-CM | POA: Diagnosis not present

## 2023-07-06 ENCOUNTER — Other Ambulatory Visit (HOSPITAL_COMMUNITY): Payer: Self-pay

## 2023-07-06 DIAGNOSIS — J343 Hypertrophy of nasal turbinates: Secondary | ICD-10-CM | POA: Diagnosis not present

## 2023-07-06 DIAGNOSIS — J321 Chronic frontal sinusitis: Secondary | ICD-10-CM | POA: Diagnosis not present

## 2023-07-06 DIAGNOSIS — J342 Deviated nasal septum: Secondary | ICD-10-CM | POA: Diagnosis not present

## 2023-07-06 DIAGNOSIS — J322 Chronic ethmoidal sinusitis: Secondary | ICD-10-CM | POA: Diagnosis not present

## 2023-07-06 DIAGNOSIS — J300 Vasomotor rhinitis: Secondary | ICD-10-CM | POA: Diagnosis not present

## 2023-07-07 DIAGNOSIS — R262 Difficulty in walking, not elsewhere classified: Secondary | ICD-10-CM | POA: Diagnosis not present

## 2023-07-07 DIAGNOSIS — M5431 Sciatica, right side: Secondary | ICD-10-CM | POA: Diagnosis not present

## 2023-07-07 DIAGNOSIS — R293 Abnormal posture: Secondary | ICD-10-CM | POA: Diagnosis not present

## 2023-07-07 DIAGNOSIS — M25651 Stiffness of right hip, not elsewhere classified: Secondary | ICD-10-CM | POA: Diagnosis not present

## 2023-07-07 DIAGNOSIS — M25551 Pain in right hip: Secondary | ICD-10-CM | POA: Diagnosis not present

## 2023-07-12 DIAGNOSIS — R293 Abnormal posture: Secondary | ICD-10-CM | POA: Diagnosis not present

## 2023-07-12 DIAGNOSIS — M25651 Stiffness of right hip, not elsewhere classified: Secondary | ICD-10-CM | POA: Diagnosis not present

## 2023-07-12 DIAGNOSIS — M5431 Sciatica, right side: Secondary | ICD-10-CM | POA: Diagnosis not present

## 2023-07-12 DIAGNOSIS — R262 Difficulty in walking, not elsewhere classified: Secondary | ICD-10-CM | POA: Diagnosis not present

## 2023-07-12 DIAGNOSIS — M25551 Pain in right hip: Secondary | ICD-10-CM | POA: Diagnosis not present

## 2023-07-14 DIAGNOSIS — M5431 Sciatica, right side: Secondary | ICD-10-CM | POA: Diagnosis not present

## 2023-07-14 DIAGNOSIS — R262 Difficulty in walking, not elsewhere classified: Secondary | ICD-10-CM | POA: Diagnosis not present

## 2023-07-14 DIAGNOSIS — R293 Abnormal posture: Secondary | ICD-10-CM | POA: Diagnosis not present

## 2023-07-14 DIAGNOSIS — M25651 Stiffness of right hip, not elsewhere classified: Secondary | ICD-10-CM | POA: Diagnosis not present

## 2023-07-14 DIAGNOSIS — M25551 Pain in right hip: Secondary | ICD-10-CM | POA: Diagnosis not present

## 2023-07-21 DIAGNOSIS — R293 Abnormal posture: Secondary | ICD-10-CM | POA: Diagnosis not present

## 2023-07-21 DIAGNOSIS — R262 Difficulty in walking, not elsewhere classified: Secondary | ICD-10-CM | POA: Diagnosis not present

## 2023-07-21 DIAGNOSIS — M25551 Pain in right hip: Secondary | ICD-10-CM | POA: Diagnosis not present

## 2023-07-21 DIAGNOSIS — M5431 Sciatica, right side: Secondary | ICD-10-CM | POA: Diagnosis not present

## 2023-07-21 DIAGNOSIS — M25651 Stiffness of right hip, not elsewhere classified: Secondary | ICD-10-CM | POA: Diagnosis not present

## 2023-07-26 DIAGNOSIS — M5431 Sciatica, right side: Secondary | ICD-10-CM | POA: Diagnosis not present

## 2023-07-26 DIAGNOSIS — R262 Difficulty in walking, not elsewhere classified: Secondary | ICD-10-CM | POA: Diagnosis not present

## 2023-07-26 DIAGNOSIS — M25651 Stiffness of right hip, not elsewhere classified: Secondary | ICD-10-CM | POA: Diagnosis not present

## 2023-07-26 DIAGNOSIS — M25551 Pain in right hip: Secondary | ICD-10-CM | POA: Diagnosis not present

## 2023-07-26 DIAGNOSIS — R293 Abnormal posture: Secondary | ICD-10-CM | POA: Diagnosis not present

## 2023-07-28 DIAGNOSIS — M5431 Sciatica, right side: Secondary | ICD-10-CM | POA: Diagnosis not present

## 2023-07-28 DIAGNOSIS — R293 Abnormal posture: Secondary | ICD-10-CM | POA: Diagnosis not present

## 2023-07-28 DIAGNOSIS — M25551 Pain in right hip: Secondary | ICD-10-CM | POA: Diagnosis not present

## 2023-07-28 DIAGNOSIS — R262 Difficulty in walking, not elsewhere classified: Secondary | ICD-10-CM | POA: Diagnosis not present

## 2023-07-28 DIAGNOSIS — M25651 Stiffness of right hip, not elsewhere classified: Secondary | ICD-10-CM | POA: Diagnosis not present

## 2023-07-29 ENCOUNTER — Ambulatory Visit (INDEPENDENT_AMBULATORY_CARE_PROVIDER_SITE_OTHER): Payer: HMO

## 2023-07-29 VITALS — BP 120/70 | HR 74 | Temp 98.1°F | Wt 254.2 lb

## 2023-07-29 DIAGNOSIS — Z0000 Encounter for general adult medical examination without abnormal findings: Secondary | ICD-10-CM

## 2023-07-29 NOTE — Progress Notes (Addendum)
Subjective:   Mitchell Humphrey is a 82 y.o. male who presents for Medicare Annual/Subsequent preventive examination.  Visit Complete: In person  Review of Systems     Cardiac Risk Factors include: advanced age (>79men, >69 women);dyslipidemia;hypertension;obesity (BMI >30kg/m2);male gender     Objective:    Today's Vitals   07/29/23 1140  BP: 120/70  Pulse: 74  Temp: 98.1 F (36.7 C)  SpO2: 94%  Weight: 254 lb 3.2 oz (115.3 kg)   Body mass index is 37.54 kg/m.     07/29/2023   11:45 AM 10/20/2022    1:59 PM 05/22/2022   11:21 AM 03/11/2022    9:55 AM 12/16/2020    4:43 AM  Advanced Directives  Does Patient Have a Medical Advance Directive? Yes Yes No No Yes  Type of Estate agent of [place];Living will Healthcare Power of [place];Living will   Healthcare Power of [place];Living will  Does patient want to make changes to medical advance directive?     No - Patient declined  Copy of Healthcare Power of Attorney in Chart? No - copy requested No - copy requested   No - copy requested  Would patient like information on creating a medical advance directive?   No - Patient declined No - Patient declined No - Patient declined    Current Medications (verified) Outpatient Encounter Medications as of 07/29/2023  Medication Sig   acetaminophen (TYLENOL) 500 MG tablet Take 1,000 mg by mouth every 6 (six) hours as needed for moderate pain.   amLODipine (NORVASC) 5 MG tablet Take 1 tablet (5 mg total) by mouth daily.   aspirin EC 81 MG tablet Take 81 mg by mouth daily. Swallow whole.   atorvastatin (LIPITOR) 20 MG tablet Take 1 tablet (20 mg total) by mouth in the morning and at bedtime.   cetirizine (ZYRTEC) 10 MG tablet Take 10 mg by mouth daily.   Cholecalciferol (VITAMIN D3) 125 MCG (5000 UT) CAPS Take 5,000 Units by mouth daily.   Cyanocobalamin (VITAMIN B 12 PO) Take 1 capsule by mouth daily in the afternoon.   diclofenac Sodium (VOLTAREN) 1 % GEL Apply 2 g  topically daily as needed (pain).   doxycycline (VIBRA-TABS) 100 MG tablet Take 1 tablet (100 mg total) by mouth 2 (two) times daily.   ELDERBERRY PO Take 1 capsule by mouth daily.   finasteride (PROSCAR) 5 MG tablet Take 1 tablet (5 mg total) by mouth daily.   ipratropium (ATROVENT) 0.06 % nasal spray Place 2 sprays into both nostrils 4 (four) times daily.   ketorolac (ACULAR) 0.5 % ophthalmic solution    levothyroxine (SYNTHROID) 125 MCG tablet Take 1 tablet (125 mcg total) by mouth daily.   losartan (COZAAR) 50 MG tablet Take 1 tablet (50 mg total) by mouth 2 (two) times daily.   meloxicam (MOBIC) 15 MG tablet TAKE 1 TABLET (15 MG TOTAL) BY MOUTH DAILY.   Multiple Vitamins-Minerals (IMMUNE SUPPORT PO) Take 1 tablet by mouth daily. NHR Immune Protect   oxybutynin (DITROPAN-XL) 5 MG 24 hr tablet Take 1 tablet (5 mg total) by mouth daily.   Polyethyl Glycol-Propyl Glycol (SYSTANE OP) Place 1 drop into both eyes daily as needed (irritation).   spironolactone (ALDACTONE) 25 MG tablet Take 1 tablet (25 mg total) by mouth daily.   tamsulosin (FLOMAX) 0.4 MG CAPS capsule Take 1 capsule (0.4 mg total) by mouth daily.   [DISCONTINUED] oxybutynin (DITROPAN-XL) 5 MG 24 hr tablet Take 5 mg by mouth daily.   [  DISCONTINUED] acetaminophen-codeine (TYLENOL #3) 300-30 MG tablet Take 1 tablet by mouth every 6 (six) hours as needed for moderate pain.   [DISCONTINUED] clindamycin (CLEOCIN) 300 MG capsule Take 1 capsule (300 mg total) by mouth 3 (three) times daily with meals for 14 days (Patient not taking: Reported on 03/16/2023)   [DISCONTINUED] doxycycline (VIBRAMYCIN) 50 MG capsule Take one capsule twice daily with meals for 10 days. (Patient not taking: Reported on 06/02/2023)   [DISCONTINUED] loratadine (CLARITIN) 10 MG tablet Take 10 mg by mouth daily.   No facility-administered encounter medications on file as of 07/29/2023.    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 Clifton James and Dr. Laneta Simmers   Severe aortic stenosis    Past Surgical History:  Procedure Laterality Date   APPENDECTOMY     COLONOSCOPY  08/2008   Dr. Ewing Schlein   HERNIA REPAIR  1970   [facility]   INTRAOPERATIVE TRANSTHORACIC ECHOCARDIOGRAM N/A 05/26/2022   Procedure: INTRAOPERATIVE TRANSTHORACIC ECHOCARDIOGRAM;  Surgeon: Kathleene Hazel, MD;  Location: [facility];  Service: Open Heart Surgery;  Laterality: N/A;   RIGHT HEART CATH AND CORONARY ANGIOGRAPHY N/A 03/11/2022   Procedure: RIGHT HEART CATH AND CORONARY ANGIOGRAPHY;  Surgeon: Kathleene Hazel, MD;  Location: [facility];  Service: Cardiovascular;  Laterality: N/A;   TRANSCATHETER AORTIC VALVE REPLACEMENT, TRANSFEMORAL N/A 05/26/2022   Procedure: Transcatheter Aortic Valve Replacement, Transfemoral;  Surgeon: Kathleene Hazel, MD;  Location: [facility];  Service: Open Heart Surgery;  Laterality: N/A;   Family History  Problem Relation Age of Onset   Breast cancer Mother    Lung cancer Father    Brain cancer Father    Breast cancer Sister    Heart attack Sister    Social History   Socioeconomic History   Marital status: Married    Spouse name: Not on file   Number of children: 3   Years of education: Not on file   Highest education level: Not on file  Occupational History   Occupation: Administrator, Civil Service   Occupation: Retired-Construction  Tobacco Use   Smoking status: Never   Smokeless  tobacco: Never   Tobacco comments:    He chews on a cigar  Vaping Use   Vaping status: Never Used  Substance and Sexual Activity   Alcohol use: No   Drug use: No   Sexual activity: Not Currently  Other Topics Concern   Not on file  Social History Narrative   Superintendent for construction site   Lives with wife   2 step children   2 sons   Social Determinants of Health   Financial Resource Strain: Low Risk  (07/29/2023)   Overall Financial Resource Strain (CARDIA)    Difficulty of Paying Living Expenses: Not hard at all  Food Insecurity: No Food Insecurity (07/29/2023)   Hunger Vital Sign    Worried About  Running Out of Food in the Last Year: Never true    Ran Out of Food in the Last Year: Never true  Transportation Needs: No Transportation Needs (07/29/2023)   PRAPARE - Administrator, Civil Service (Medical): No    Lack of Transportation (Non-Medical): No  Physical Activity: Sufficiently Active (07/29/2023)   Exercise Vital Sign    Days of Exercise per Week: 7 days    Minutes of Exercise per Session: 40 min  Stress: No Stress Concern Present (07/29/2023)   [employer] of Occupational Health - Occupational Stress Questionnaire    Feeling of Stress : Not at all  Social Connections: Moderately Isolated (07/29/2023)   Social Connection and Isolation Panel [NHANES]    Frequency of Communication with Friends and Family: More than three times a week    Frequency of Social Gatherings with Friends and Family: More than three times a week    Attends Religious Services: Never    Database administrator or Organizations: No    Attends Engineer, structural: Never    Marital Status: Married    Tobacco Counseling Counseling given: Not Answered Tobacco comments: He chews on a cigar   Clinical Intake:  Pre-visit preparation completed: Yes  Pain : No/denies pain     BMI - recorded: 37.54 Nutritional Status: BMI > 30  Obese Diabetes: No  How often do  you need to have someone help you when you read instructions, pamphlets, or other written materials from your doctor or pharmacy?: 1 - Never  Interpreter Needed?: No  Information entered by :: Lanier Ensign, LPN   Activities of Daily Living    07/29/2023   11:42 AM 10/20/2022    2:01 PM  In your present state of health, do you have any difficulty performing the following activities:  Hearing? 0 1  Comment  hearing aids  Vision? 0 0  Difficulty concentrating or making decisions? 0 0  Walking or climbing stairs? 0 0  Dressing or bathing? 0 0  Doing errands, shopping? 0 0  Preparing Food and eating ? N N  Using the Toilet? N N  In the past six months, have you accidently leaked urine? Y Y  Comment at times wears a brief at times  Do you have problems with loss of bowel control? N N  Managing your Medications? N N  Managing your Finances? N N  Housekeeping or managing your Housekeeping? N N    Patient Care Team: Jarold Motto, [place] as PCP - General (Physician Assistant) O'Neal, Ronnald Ramp, MD as PCP - Cardiology (Cardiology) Tracey Harries, MD as Referring Physician (Family Medicine)  Indicate any recent Medical Services you may have received from other than [facility] providers in the past year (date may be approximate).     Assessment:   This is a routine wellness examination for Grady.  Hearing/Vision screen Hearing Screening - Comments:: Pt wears hearing aids  Vision Screening - Comments:: Pt follows up with Dr Sherrine Maples for annual eye exams    Goals Addressed             This Visit's Progress    Patient Stated       Lose a litlle weight        Depression Screen    07/29/2023   11:46 AM 12/16/2022    3:30 PM 10/20/2022    1:58 PM 07/06/2022    7:55 AM 06/02/2021   12:56 PM 10/23/2020    1:32 PM  PHQ  2/9 Scores  PHQ - 2 Score 0 0 0 0 0 0    Fall Risk    07/29/2023   11:48 AM 12/16/2022    3:31 PM 10/20/2022    2:00 PM  Fall Risk   Falls in the past year? 1 1  1   Number falls in past yr: 1 1 1   Injury with Fall? 1 1 1   Comment right hip still discomfort PT following up  bruised and scratches arms and hip  Risk for fall due to : Impaired vision;Impaired balance/gait;Impaired mobility History of fall(s);Impaired balance/gait Impaired vision;Impaired balance/gait  Risk for fall due to: Comment at times    Follow up Falls prevention discussed Falls evaluation completed Falls prevention discussed    MEDICARE RISK AT HOME: Medicare Risk at Home Any stairs in or around the home?: No If so, are there any without handrails?: No Home free of loose throw rugs in walkways, pet beds, electrical cords, etc?: Yes Adequate lighting in your home to reduce risk of falls?: Yes Life alert?: No Use of a cane, walker or w/c?: No Grab bars in the bathroom?: No Shower chair or bench in shower?: No Elevated toilet seat or a handicapped toilet?: No  TIMED UP AND GO:  Was the test performed?  Yes  Length of time to ambulate 10 feet: 10 sec Gait steady and fast without use of assistive device    Cognitive Function:        07/29/2023   11:50 AM 10/20/2022    2:01 PM  6CIT Screen  What Year? 0 points 0 points  What month? 0 points 0 points  What time? 0 points 0 points  Count back from 20 0 points 0 points  Months in reverse 0 points 0 points  Repeat phrase 0 points 0 points  Total Score 0 points 0 points    Immunizations Immunization History  Administered Date(s) Administered   DTaP 02/08/2002   Fluad Quad(high Dose 65+) 09/04/2021, 08/14/2022   Influenza Split 08/20/2008   Influenza Whole 08/20/2008   Influenza, High Dose Seasonal PF 09/02/2015, 08/09/2017, 08/23/2018, 01/08/2020   Influenza-Unspecified 08/23/2020   Moderna Sars-Covid-2 Vaccination 02/22/2020, 03/23/2020   PPD Test 08/20/2008   Pneumococcal Conjugate-13 06/19/1997   Pneumococcal Polysaccharide-23 05/25/2012, 05/18/2018   Tdap 05/25/2012   Zoster Recombinant(Shingrix)  01/15/2021, 07/18/2021   Zoster, Live 04/12/2007    TDAP status: Due, Education has been provided regarding the importance of this vaccine. Advised may receive this vaccine at local pharmacy or Health Dept. Aware to provide a copy of the vaccination record if obtained from local pharmacy or Health Dept. Verbalized acceptance and understanding.  Flu Vaccine status: Due, Education has been provided regarding the importance of this vaccine. Advised may receive this vaccine at local pharmacy or Health Dept. Aware to provide a copy of the vaccination record if obtained from local pharmacy or Health Dept. Verbalized acceptance and understanding.  Pneumococcal vaccine status: Up to date  Covid-19 vaccine status: Declined, Education has been provided regarding the importance of this vaccine but patient still declined. Advised may receive this vaccine at local pharmacy or Health Dept.or vaccine clinic. Aware to provide a copy of the vaccination record if obtained from local pharmacy or Health Dept. Verbalized acceptance and understanding.  Qualifies for Shingles Vaccine? Yes   Zostavax completed Yes   Shingrix Completed?: Yes  Screening Tests Health Maintenance  Topic Date Due   DTaP/Tdap/Td (3 - Td or Tdap) 05/25/2022   COVID-19 Vaccine (3 - 2023-24  season) 07/18/2023   INFLUENZA VACCINE  02/14/2024 (Originally 06/17/2023)   Medicare Annual Wellness (AWV)  07/28/2024   Pneumonia Vaccine 61+ Years old  Completed   Zoster Vaccines- Shingrix  Completed   HPV VACCINES  Aged Out    Health Maintenance  Health Maintenance Due  Topic Date Due   DTaP/Tdap/Td (3 - Td or Tdap) 05/25/2022   COVID-19 Vaccine (3 - 2023-24 season) 07/18/2023    Colorectal cancer screening: No longer required.    Additional Screening:    Vision Screening: Recommended annual ophthalmology exams for early detection of glaucoma and other disorders of the eye. Is the patient up to date with their annual eye exam?  Yes   Who is the provider or what is the name of the office in which the patient attends annual eye exams? Dr Sherrine Maples  If pt is not established with a provider, would they like to be referred to a provider to establish care? No .   Dental Screening: Recommended annual dental exams for proper oral hygiene   Community Resource Referral / Chronic Care Management: CRR required this visit?  No   CCM required this visit?  No     Plan:     I have personally reviewed and noted the following in the patient's chart:   Medical and social history Use of alcohol, tobacco or illicit drugs  Current medications and supplements including opioid prescriptions. Patient is not currently taking opioid prescriptions. Functional ability and status Nutritional status Physical activity Advanced directives List of other physicians Hospitalizations, surgeries, and ER visits in previous 12 months Vitals Screenings to include cognitive, depression, and falls Referrals and appointments  In addition, I have reviewed and discussed with patient certain preventive protocols, quality metrics, and best practice recommendations. A written personalized care plan for preventive services as well as general preventive health recommendations were provided to patient.     Marzella Schlein, LPN   05/06/3085   After Visit Summary: (MyChart) Due to this being a telephonic visit, the after visit summary with patients personalized plan was offered to patient via MyChart   Nurse Notes:  Pt stated he is in therapy and still has right hip discomfort and is requesting a xray for further evaluation.

## 2023-07-29 NOTE — Patient Instructions (Signed)
[name] , Thank you for taking time to come for your Medicare Wellness Visit. I appreciate your ongoing commitment to your health goals. Please review the following plan we discussed and let me know if I can assist you in the future.   Referrals/Orders/Follow-Ups/Clinician Recommendations: continue to lose a little weight   This is a list of the screening recommended for you and due dates:  Health Maintenance  Topic Date Due   DTaP/Tdap/Td vaccine (3 - Td or Tdap) 05/25/2022   COVID-19 Vaccine (3 - 2023-24 season) 07/18/2023   Flu Shot  02/14/2024*   Medicare Annual Wellness Visit  07/28/2024   Pneumonia Vaccine  Completed   Zoster (Shingles) Vaccine  Completed   HPV Vaccine  Aged Out  *Topic was postponed. The date shown is not the original due date.    Advanced directives: (Copy Requested) Please bring a copy of your health care power of attorney and living will to the office to be added to your chart at your convenience.  Next Medicare Annual Wellness Visit scheduled for next year: Yes

## 2023-07-30 ENCOUNTER — Other Ambulatory Visit: Payer: Self-pay | Admitting: Physician Assistant

## 2023-07-30 ENCOUNTER — Telehealth: Payer: Self-pay | Admitting: Physician Assistant

## 2023-07-30 DIAGNOSIS — M25551 Pain in right hip: Secondary | ICD-10-CM

## 2023-07-30 NOTE — Telephone Encounter (Signed)
Patient had recent Annual Wellness Visit (AWV) with Inetta Fermo at our office and requested xray for ongoing right hip pain.  Please let patient know that I have ordered this am instruct him to go to [facility] whenever ready to have this done  An order for xray has been put in for you. To have this done, you can walk in at the [facility] location without a scheduled appointment.  The address is [address] It is across the street from [facility]. [facility] are located in the basement.   Hours of operation are M-F 8:30am to 5:00pm.  Please note that they are closed for lunch between 12:30 and 1:00pm.

## 2023-07-30 NOTE — Telephone Encounter (Signed)
Spoke to pt told him Lelon Mast has placed order for x-ray for his ongoing hip pain. Told him Please let patient know that I have ordered this am instruct him to go to [facility] whenever ready to have this done   An order for xray has been put in for you. To have this done, you can walk in at the [facility] location without a scheduled appointment.  The address is [address]. It is across the street from [facility]. [facility] are located in the basement.    Hours of operation are M-F 8:30am to 5:00pm.  Please note that they are closed for lunch between 12:30 and 1:00pm.  Pt verbalized understanding and will go next week.

## 2023-08-02 ENCOUNTER — Ambulatory Visit (INDEPENDENT_AMBULATORY_CARE_PROVIDER_SITE_OTHER)
Admission: RE | Admit: 2023-08-02 | Discharge: 2023-08-02 | Disposition: A | Discharge status: Home / Self Care | Payer: HMO | Source: Ambulatory Visit | Attending: Physician Assistant | Admitting: Physician Assistant

## 2023-08-02 DIAGNOSIS — M25551 Pain in right hip: Secondary | ICD-10-CM | POA: Diagnosis not present

## 2023-08-04 DIAGNOSIS — M25651 Stiffness of right hip, not elsewhere classified: Secondary | ICD-10-CM | POA: Diagnosis not present

## 2023-08-04 DIAGNOSIS — M5431 Sciatica, right side: Secondary | ICD-10-CM | POA: Diagnosis not present

## 2023-08-04 DIAGNOSIS — M25551 Pain in right hip: Secondary | ICD-10-CM | POA: Diagnosis not present

## 2023-08-04 DIAGNOSIS — R262 Difficulty in walking, not elsewhere classified: Secondary | ICD-10-CM | POA: Diagnosis not present

## 2023-08-04 DIAGNOSIS — R293 Abnormal posture: Secondary | ICD-10-CM | POA: Diagnosis not present

## 2023-08-06 DIAGNOSIS — R293 Abnormal posture: Secondary | ICD-10-CM | POA: Diagnosis not present

## 2023-08-06 DIAGNOSIS — M5431 Sciatica, right side: Secondary | ICD-10-CM | POA: Diagnosis not present

## 2023-08-06 DIAGNOSIS — R262 Difficulty in walking, not elsewhere classified: Secondary | ICD-10-CM | POA: Diagnosis not present

## 2023-08-06 DIAGNOSIS — M25651 Stiffness of right hip, not elsewhere classified: Secondary | ICD-10-CM | POA: Diagnosis not present

## 2023-08-06 DIAGNOSIS — M25551 Pain in right hip: Secondary | ICD-10-CM | POA: Diagnosis not present

## 2023-08-07 ENCOUNTER — Other Ambulatory Visit (HOSPITAL_COMMUNITY): Payer: Self-pay

## 2023-08-09 DIAGNOSIS — R293 Abnormal posture: Secondary | ICD-10-CM | POA: Diagnosis not present

## 2023-08-09 DIAGNOSIS — M5431 Sciatica, right side: Secondary | ICD-10-CM | POA: Diagnosis not present

## 2023-08-09 DIAGNOSIS — R262 Difficulty in walking, not elsewhere classified: Secondary | ICD-10-CM | POA: Diagnosis not present

## 2023-08-09 DIAGNOSIS — M25651 Stiffness of right hip, not elsewhere classified: Secondary | ICD-10-CM | POA: Diagnosis not present

## 2023-08-09 DIAGNOSIS — M25551 Pain in right hip: Secondary | ICD-10-CM | POA: Diagnosis not present

## 2023-08-10 ENCOUNTER — Telehealth: Payer: Self-pay | Admitting: Physician Assistant

## 2023-08-10 NOTE — Telephone Encounter (Signed)
Patient requests to be called to be given XRAY results

## 2023-08-11 DIAGNOSIS — M25551 Pain in right hip: Secondary | ICD-10-CM | POA: Diagnosis not present

## 2023-08-11 DIAGNOSIS — R293 Abnormal posture: Secondary | ICD-10-CM | POA: Diagnosis not present

## 2023-08-11 DIAGNOSIS — M5431 Sciatica, right side: Secondary | ICD-10-CM | POA: Diagnosis not present

## 2023-08-11 DIAGNOSIS — M25651 Stiffness of right hip, not elsewhere classified: Secondary | ICD-10-CM | POA: Diagnosis not present

## 2023-08-11 DIAGNOSIS — R262 Difficulty in walking, not elsewhere classified: Secondary | ICD-10-CM | POA: Diagnosis not present

## 2023-08-11 NOTE — Telephone Encounter (Signed)
Called Radiology and asked them to read x-ray from 9/16. She said will get it read.

## 2023-08-11 NOTE — Telephone Encounter (Signed)
See result notes.

## 2023-08-18 DIAGNOSIS — M5431 Sciatica, right side: Secondary | ICD-10-CM | POA: Diagnosis not present

## 2023-08-18 DIAGNOSIS — R262 Difficulty in walking, not elsewhere classified: Secondary | ICD-10-CM | POA: Diagnosis not present

## 2023-08-18 DIAGNOSIS — M25551 Pain in right hip: Secondary | ICD-10-CM | POA: Diagnosis not present

## 2023-08-18 DIAGNOSIS — M25651 Stiffness of right hip, not elsewhere classified: Secondary | ICD-10-CM | POA: Diagnosis not present

## 2023-08-18 DIAGNOSIS — R293 Abnormal posture: Secondary | ICD-10-CM | POA: Diagnosis not present

## 2023-08-23 DIAGNOSIS — R293 Abnormal posture: Secondary | ICD-10-CM | POA: Diagnosis not present

## 2023-08-23 DIAGNOSIS — M5431 Sciatica, right side: Secondary | ICD-10-CM | POA: Diagnosis not present

## 2023-08-23 DIAGNOSIS — M25651 Stiffness of right hip, not elsewhere classified: Secondary | ICD-10-CM | POA: Diagnosis not present

## 2023-08-23 DIAGNOSIS — R262 Difficulty in walking, not elsewhere classified: Secondary | ICD-10-CM | POA: Diagnosis not present

## 2023-08-23 DIAGNOSIS — M25551 Pain in right hip: Secondary | ICD-10-CM | POA: Diagnosis not present

## 2023-08-25 DIAGNOSIS — R293 Abnormal posture: Secondary | ICD-10-CM | POA: Diagnosis not present

## 2023-08-25 DIAGNOSIS — M25551 Pain in right hip: Secondary | ICD-10-CM | POA: Diagnosis not present

## 2023-08-25 DIAGNOSIS — R262 Difficulty in walking, not elsewhere classified: Secondary | ICD-10-CM | POA: Diagnosis not present

## 2023-08-25 DIAGNOSIS — M5431 Sciatica, right side: Secondary | ICD-10-CM | POA: Diagnosis not present

## 2023-08-25 DIAGNOSIS — M25651 Stiffness of right hip, not elsewhere classified: Secondary | ICD-10-CM | POA: Diagnosis not present

## 2023-08-26 ENCOUNTER — Other Ambulatory Visit (HOSPITAL_COMMUNITY): Payer: Self-pay

## 2023-08-26 ENCOUNTER — Other Ambulatory Visit: Payer: Self-pay | Admitting: Physician Assistant

## 2023-08-26 MED ORDER — LEVOTHYROXINE SODIUM 125 MCG PO TABS
125.0000 ug | ORAL_TABLET | Freq: Every day | ORAL | 5 refills | Status: DC
  Filled 2023-08-26 – 2023-09-09 (×2): qty 30, 30d supply, fill #0
  Filled 2023-10-05: qty 30, 30d supply, fill #1
  Filled 2023-11-05: qty 30, 30d supply, fill #2
  Filled 2023-12-09 (×2): qty 30, 30d supply, fill #3
  Filled 2024-01-04: qty 30, 30d supply, fill #4
  Filled 2024-02-03: qty 30, 30d supply, fill #5

## 2023-08-27 ENCOUNTER — Other Ambulatory Visit (HOSPITAL_COMMUNITY): Payer: Self-pay

## 2023-08-27 ENCOUNTER — Other Ambulatory Visit: Payer: Self-pay

## 2023-08-27 DIAGNOSIS — Z961 Presence of intraocular lens: Secondary | ICD-10-CM | POA: Diagnosis not present

## 2023-08-27 DIAGNOSIS — H35362 Drusen (degenerative) of macula, left eye: Secondary | ICD-10-CM | POA: Diagnosis not present

## 2023-08-27 DIAGNOSIS — H26493 Other secondary cataract, bilateral: Secondary | ICD-10-CM | POA: Diagnosis not present

## 2023-08-27 DIAGNOSIS — H35371 Puckering of macula, right eye: Secondary | ICD-10-CM | POA: Diagnosis not present

## 2023-08-27 DIAGNOSIS — H04123 Dry eye syndrome of bilateral lacrimal glands: Secondary | ICD-10-CM | POA: Diagnosis not present

## 2023-08-30 DIAGNOSIS — R262 Difficulty in walking, not elsewhere classified: Secondary | ICD-10-CM | POA: Diagnosis not present

## 2023-08-30 DIAGNOSIS — R293 Abnormal posture: Secondary | ICD-10-CM | POA: Diagnosis not present

## 2023-08-30 DIAGNOSIS — M25551 Pain in right hip: Secondary | ICD-10-CM | POA: Diagnosis not present

## 2023-08-30 DIAGNOSIS — M5431 Sciatica, right side: Secondary | ICD-10-CM | POA: Diagnosis not present

## 2023-08-30 DIAGNOSIS — M25651 Stiffness of right hip, not elsewhere classified: Secondary | ICD-10-CM | POA: Diagnosis not present

## 2023-09-01 DIAGNOSIS — M25551 Pain in right hip: Secondary | ICD-10-CM | POA: Diagnosis not present

## 2023-09-01 DIAGNOSIS — M25651 Stiffness of right hip, not elsewhere classified: Secondary | ICD-10-CM | POA: Diagnosis not present

## 2023-09-01 DIAGNOSIS — R293 Abnormal posture: Secondary | ICD-10-CM | POA: Diagnosis not present

## 2023-09-01 DIAGNOSIS — M5431 Sciatica, right side: Secondary | ICD-10-CM | POA: Diagnosis not present

## 2023-09-01 DIAGNOSIS — R262 Difficulty in walking, not elsewhere classified: Secondary | ICD-10-CM | POA: Diagnosis not present

## 2023-09-06 DIAGNOSIS — M5431 Sciatica, right side: Secondary | ICD-10-CM | POA: Diagnosis not present

## 2023-09-06 DIAGNOSIS — R293 Abnormal posture: Secondary | ICD-10-CM | POA: Diagnosis not present

## 2023-09-06 DIAGNOSIS — N3941 Urge incontinence: Secondary | ICD-10-CM | POA: Diagnosis not present

## 2023-09-06 DIAGNOSIS — M25551 Pain in right hip: Secondary | ICD-10-CM | POA: Diagnosis not present

## 2023-09-06 DIAGNOSIS — N401 Enlarged prostate with lower urinary tract symptoms: Secondary | ICD-10-CM | POA: Diagnosis not present

## 2023-09-06 DIAGNOSIS — R262 Difficulty in walking, not elsewhere classified: Secondary | ICD-10-CM | POA: Diagnosis not present

## 2023-09-06 DIAGNOSIS — M25651 Stiffness of right hip, not elsewhere classified: Secondary | ICD-10-CM | POA: Diagnosis not present

## 2023-09-09 ENCOUNTER — Other Ambulatory Visit (HOSPITAL_COMMUNITY): Payer: Self-pay

## 2023-09-13 ENCOUNTER — Other Ambulatory Visit (HOSPITAL_COMMUNITY): Payer: Self-pay

## 2023-09-13 DIAGNOSIS — M545 Low back pain, unspecified: Secondary | ICD-10-CM | POA: Diagnosis not present

## 2023-09-13 MED ORDER — PREDNISONE 10 MG (21) PO TBPK
10.0000 mg | ORAL_TABLET | ORAL | 0 refills | Status: DC
  Filled 2023-09-13: qty 21, 6d supply, fill #0

## 2023-09-13 NOTE — Progress Notes (Unsigned)
Cardiology Office Note:  .   Date:  09/14/2023  ID:  Mitchell Humphrey, DOB 08-03-1941, MRN [id number] PCP: Jarold Motto, PA  [facility] Providers Cardiologist:  Reatha Harps, MD    History of Present Illness: .   Mitchell Humphrey is a 82 y.o. male with below history who presents for follow-up.   Discussed the use of AI scribe software for clinical note transcription with the patient, who gave verbal consent to proceed.  History of Present Illness   Mitchell Humphrey, an 82 year old patient with a history of severe aortic stenosis, post TAVR, hypertension, hyperlipidemia, nonobstructive CAD, diabetes, and PAD, presents for a routine follow-up. The patient has been monitoring his blood pressure at home and notes that it tends to increase in the evening, with readings in the 140s and 150s. Morning readings are typically in the 120s and 130s. The patient denies any chest pain or difficulty breathing and reports walking daily for exercise. However, he has been experiencing back pain for the past couple of weeks. The patient is currently on amlodipine 5mg  daily and losartan 50mg  twice daily for hypertension management. Despite eating less, the patient reports weight gain.           Problem List 1. DVT -L gastrocnemius vein 12/17/2019 -in setting of Covid PNA 2. 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 HTN 4. HLD -T chol 136, HDL 41, LDL 73, TG 120 5.  CAD -CAC 1599 (81st percentile) -non-obstructive CAD  6. DM -A1c 6.3 7. PAD -Severe right common femoral artery atherosclerosis on CTA -ABI negative 05/15/2022    ROS: All other ROS reviewed and negative. Pertinent positives noted in the HPI.     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  When compared with ECG of 27-May-2022 06:13, Confirmed by Lennie Odor [phone]) on 09/14/2023 9:50:00 AM   Physical Exam:   VS:  BP 130/74 (BP Location: Left Arm, Patient Position: Sitting, Cuff Size: Normal)   Pulse 63   Ht 5\' 8"  (1.727 m)   Wt 259 lb (117.5 kg)   SpO2 97%   BMI 39.38 kg/m    Wt Readings from Last 3 Encounters:  09/14/23 259 lb (117.5 kg)  07/29/23 254 lb 3.2 oz (115.3 kg)  06/16/23 248 lb 12.8 oz (112.9 kg)    GEN: Well nourished, well developed in no acute distress NECK: No JVD; No carotid bruits CARDIAC: RRR, no murmurs, rubs, gallops RESPIRATORY:  Clear to auscultation without rales, wheezing or rhonchi  ABDOMEN: Soft, non-tender, non-distended EXTREMITIES:  No edema; No deformity  ASSESSMENT AND PLAN: .   Assessment and Plan    Hypertension Blood pressure readings fluctuating with higher readings in the evening. Currently on Amlodipine 5mg  daily and Losartan 50mg  twice daily. -Adjust timing of evening Losartan dose to be taken with dinner to prevent nighttime spikes.  Severe Aortic Stenosis status post TAVR No current symptoms. Prosthetic valve function stable on recent echo. -Continue Aspirin 81mg  daily. -Ensure patient is aware of need for antibiotics prior to dental work.  Nonobstructive CAD On Aspirin and Atorvastatin 20mg  daily. Most recent LDL 73. -Continue current regimen.  Diabetes Well controlled with recent A1c of 6.3. -Continue current management.  Follow-up -See  Physician Assistant in 6 months for blood pressure check. -Return to see me in 1 year.              Follow-up: Return in about 6 months (around 03/14/2024).  Time Spent with Patient: I have spent a total of 25 minutes caring for this patient today face to face, ordering and reviewing labs/tests, reviewing prior records/medical history, examining the patient, establishing an assessment and plan, communicating results/findings to the patient/family, and documenting in the  medical record.   Signed, Lenna Gilford. Flora Lipps, MD, [facility]  [facility]  [address], [address] [place], [place] [address] [phone]  9:56 AM

## 2023-09-14 ENCOUNTER — Encounter: Payer: Self-pay | Admitting: Cardiovascular Disease

## 2023-09-14 ENCOUNTER — Ambulatory Visit: Payer: HMO | Attending: Cardiovascular Disease | Admitting: Cardiovascular Disease

## 2023-09-14 VITALS — BP 130/74 | HR 63 | Ht 68.0 in | Wt 259.0 lb

## 2023-09-14 DIAGNOSIS — I350 Nonrheumatic aortic (valve) stenosis: Secondary | ICD-10-CM

## 2023-09-14 DIAGNOSIS — E7800 Pure hypercholesterolemia, unspecified: Secondary | ICD-10-CM

## 2023-09-14 DIAGNOSIS — I5032 Chronic diastolic (congestive) heart failure: Secondary | ICD-10-CM

## 2023-09-14 DIAGNOSIS — I10 Essential (primary) hypertension: Secondary | ICD-10-CM | POA: Diagnosis not present

## 2023-09-14 DIAGNOSIS — Z952 Presence of prosthetic heart valve: Secondary | ICD-10-CM | POA: Diagnosis not present

## 2023-09-14 DIAGNOSIS — I2510 Atherosclerotic heart disease of native coronary artery without angina pectoris: Secondary | ICD-10-CM | POA: Diagnosis not present

## 2023-09-14 NOTE — Patient Instructions (Signed)
Medication Instructions:   Take Losartan 50mg  daily with dinner   *If you need a refill on your cardiac medications before your next appointment, please call your pharmacy*   Lab Work:  None   If you have labs (blood work) drawn today and your tests are completely normal, you will receive your results only by: MyChart Message (if you have MyChart) OR A paper copy in the mail If you have any lab test that is abnormal or we need to change your treatment, we will call you to review the results.   Testing/Procedures: None    Follow-Up: At [facility], you and your health needs are our priority.  As part of our continuing mission to provide you with exceptional heart care, we have created designated Provider Care Teams.  These Care Teams include your primary Cardiologist (physician) and Advanced Practice Providers (APPs -  Physician Assistants and Nurse Practitioners) who all work together to provide you with the care you need, when you need it.  We recommend signing up for the patient portal called "MyChart".  Sign up information is provided on this After Visit Summary.  MyChart is used to connect with patients for Virtual Visits (Telemedicine).  Patients are able to view lab/test results, encounter notes, upcoming appointments, etc.  Non-urgent messages can be sent to your provider as well.   To learn more about what you can do with MyChart, go to [email or web address].    Your next appointment:   6 month(s)  The format for your next appointment:   In Person  Provider:   Edd Fabian, FNP, Micah Flesher, PA-C, Marjie Skiff, PA-C, Robet Leu, PA-C, Juanda Crumble, PA-C, Joni Reining, DNP, ANP, Azalee Course, PA-C, Bernadene Person, NP, or Reather Littler, NP    Then, Reatha Harps, MD will plan to see you again in 1 year(s).   Other Instructions   Check blood pressure twice daily

## 2023-09-17 ENCOUNTER — Other Ambulatory Visit: Payer: Self-pay | Admitting: Physician Assistant

## 2023-09-17 ENCOUNTER — Other Ambulatory Visit (HOSPITAL_COMMUNITY): Payer: Self-pay

## 2023-09-17 ENCOUNTER — Other Ambulatory Visit: Payer: Self-pay

## 2023-09-17 MED ORDER — MELOXICAM 15 MG PO TABS
15.0000 mg | ORAL_TABLET | Freq: Every day | ORAL | 1 refills | Status: DC
  Filled 2023-09-17: qty 90, 90d supply, fill #0
  Filled 2023-12-15: qty 90, 90d supply, fill #1

## 2023-09-17 MED ORDER — OXYBUTYNIN CHLORIDE ER 5 MG PO TB24
5.0000 mg | ORAL_TABLET | Freq: Every day | ORAL | 11 refills | Status: AC
  Filled 2023-09-17 – 2023-09-18 (×2): qty 30, 30d supply, fill #0
  Filled 2023-10-15: qty 30, 30d supply, fill #1
  Filled 2023-11-19: qty 30, 30d supply, fill #2
  Filled 2023-12-15: qty 30, 30d supply, fill #3
  Filled 2024-01-17: qty 30, 30d supply, fill #4
  Filled 2024-02-16: qty 30, 30d supply, fill #5
  Filled 2024-03-16: qty 30, 30d supply, fill #6
  Filled 2024-04-18: qty 30, 30d supply, fill #7
  Filled 2024-05-17: qty 30, 30d supply, fill #8
  Filled 2024-06-16: qty 30, 30d supply, fill #9
  Filled 2024-07-18: qty 30, 30d supply, fill #10
  Filled 2024-08-14: qty 30, 30d supply, fill #11

## 2023-09-18 ENCOUNTER — Other Ambulatory Visit (HOSPITAL_COMMUNITY): Payer: Self-pay

## 2023-09-20 ENCOUNTER — Other Ambulatory Visit (HOSPITAL_COMMUNITY): Payer: Self-pay

## 2023-09-20 ENCOUNTER — Other Ambulatory Visit: Payer: Self-pay

## 2023-09-27 DIAGNOSIS — M25651 Stiffness of right hip, not elsewhere classified: Secondary | ICD-10-CM | POA: Diagnosis not present

## 2023-09-27 DIAGNOSIS — M25551 Pain in right hip: Secondary | ICD-10-CM | POA: Diagnosis not present

## 2023-09-27 DIAGNOSIS — R262 Difficulty in walking, not elsewhere classified: Secondary | ICD-10-CM | POA: Diagnosis not present

## 2023-09-27 DIAGNOSIS — R293 Abnormal posture: Secondary | ICD-10-CM | POA: Diagnosis not present

## 2023-09-27 DIAGNOSIS — M5431 Sciatica, right side: Secondary | ICD-10-CM | POA: Diagnosis not present

## 2023-09-29 DIAGNOSIS — R262 Difficulty in walking, not elsewhere classified: Secondary | ICD-10-CM | POA: Diagnosis not present

## 2023-09-29 DIAGNOSIS — M25651 Stiffness of right hip, not elsewhere classified: Secondary | ICD-10-CM | POA: Diagnosis not present

## 2023-09-29 DIAGNOSIS — M5431 Sciatica, right side: Secondary | ICD-10-CM | POA: Diagnosis not present

## 2023-09-29 DIAGNOSIS — M25551 Pain in right hip: Secondary | ICD-10-CM | POA: Diagnosis not present

## 2023-09-29 DIAGNOSIS — R293 Abnormal posture: Secondary | ICD-10-CM | POA: Diagnosis not present

## 2023-10-04 DIAGNOSIS — M25651 Stiffness of right hip, not elsewhere classified: Secondary | ICD-10-CM | POA: Diagnosis not present

## 2023-10-04 DIAGNOSIS — M25551 Pain in right hip: Secondary | ICD-10-CM | POA: Diagnosis not present

## 2023-10-04 DIAGNOSIS — R262 Difficulty in walking, not elsewhere classified: Secondary | ICD-10-CM | POA: Diagnosis not present

## 2023-10-04 DIAGNOSIS — M5431 Sciatica, right side: Secondary | ICD-10-CM | POA: Diagnosis not present

## 2023-10-04 DIAGNOSIS — R293 Abnormal posture: Secondary | ICD-10-CM | POA: Diagnosis not present

## 2023-10-05 ENCOUNTER — Other Ambulatory Visit: Payer: Self-pay | Admitting: Physician Assistant

## 2023-10-05 ENCOUNTER — Other Ambulatory Visit (HOSPITAL_COMMUNITY): Payer: Self-pay

## 2023-10-05 MED ORDER — FINASTERIDE 5 MG PO TABS
5.0000 mg | ORAL_TABLET | Freq: Every day | ORAL | 1 refills | Status: DC
  Filled 2023-10-05: qty 90, 90d supply, fill #0
  Filled 2024-01-04: qty 90, 90d supply, fill #1

## 2023-10-05 MED ORDER — TAMSULOSIN HCL 0.4 MG PO CAPS
0.4000 mg | ORAL_CAPSULE | Freq: Every day | ORAL | 1 refills | Status: DC
  Filled 2023-10-05: qty 90, 90d supply, fill #0
  Filled 2024-01-04: qty 90, 90d supply, fill #1

## 2023-10-06 DIAGNOSIS — M25551 Pain in right hip: Secondary | ICD-10-CM | POA: Diagnosis not present

## 2023-10-06 DIAGNOSIS — M5431 Sciatica, right side: Secondary | ICD-10-CM | POA: Diagnosis not present

## 2023-10-06 DIAGNOSIS — M25651 Stiffness of right hip, not elsewhere classified: Secondary | ICD-10-CM | POA: Diagnosis not present

## 2023-10-06 DIAGNOSIS — R262 Difficulty in walking, not elsewhere classified: Secondary | ICD-10-CM | POA: Diagnosis not present

## 2023-10-06 DIAGNOSIS — R293 Abnormal posture: Secondary | ICD-10-CM | POA: Diagnosis not present

## 2023-10-07 ENCOUNTER — Other Ambulatory Visit (HOSPITAL_COMMUNITY): Payer: Self-pay

## 2023-10-11 DIAGNOSIS — R262 Difficulty in walking, not elsewhere classified: Secondary | ICD-10-CM | POA: Diagnosis not present

## 2023-10-11 DIAGNOSIS — M5431 Sciatica, right side: Secondary | ICD-10-CM | POA: Diagnosis not present

## 2023-10-11 DIAGNOSIS — M545 Low back pain, unspecified: Secondary | ICD-10-CM | POA: Diagnosis not present

## 2023-10-11 DIAGNOSIS — M25551 Pain in right hip: Secondary | ICD-10-CM | POA: Diagnosis not present

## 2023-10-11 DIAGNOSIS — R293 Abnormal posture: Secondary | ICD-10-CM | POA: Diagnosis not present

## 2023-10-11 DIAGNOSIS — M25651 Stiffness of right hip, not elsewhere classified: Secondary | ICD-10-CM | POA: Diagnosis not present

## 2023-10-13 DIAGNOSIS — R293 Abnormal posture: Secondary | ICD-10-CM | POA: Diagnosis not present

## 2023-10-13 DIAGNOSIS — M25551 Pain in right hip: Secondary | ICD-10-CM | POA: Diagnosis not present

## 2023-10-13 DIAGNOSIS — R262 Difficulty in walking, not elsewhere classified: Secondary | ICD-10-CM | POA: Diagnosis not present

## 2023-10-13 DIAGNOSIS — M5431 Sciatica, right side: Secondary | ICD-10-CM | POA: Diagnosis not present

## 2023-10-13 DIAGNOSIS — M25651 Stiffness of right hip, not elsewhere classified: Secondary | ICD-10-CM | POA: Diagnosis not present

## 2023-10-15 ENCOUNTER — Other Ambulatory Visit (HOSPITAL_COMMUNITY): Payer: Self-pay

## 2023-10-16 ENCOUNTER — Other Ambulatory Visit (HOSPITAL_COMMUNITY): Payer: Self-pay

## 2023-10-16 DIAGNOSIS — M545 Low back pain, unspecified: Secondary | ICD-10-CM | POA: Diagnosis not present

## 2023-10-18 DIAGNOSIS — M25551 Pain in right hip: Secondary | ICD-10-CM | POA: Diagnosis not present

## 2023-10-18 DIAGNOSIS — M25651 Stiffness of right hip, not elsewhere classified: Secondary | ICD-10-CM | POA: Diagnosis not present

## 2023-10-18 DIAGNOSIS — R262 Difficulty in walking, not elsewhere classified: Secondary | ICD-10-CM | POA: Diagnosis not present

## 2023-10-18 DIAGNOSIS — R293 Abnormal posture: Secondary | ICD-10-CM | POA: Diagnosis not present

## 2023-10-18 DIAGNOSIS — M5431 Sciatica, right side: Secondary | ICD-10-CM | POA: Diagnosis not present

## 2023-10-20 DIAGNOSIS — M545 Low back pain, unspecified: Secondary | ICD-10-CM | POA: Diagnosis not present

## 2023-10-22 DIAGNOSIS — M5416 Radiculopathy, lumbar region: Secondary | ICD-10-CM | POA: Diagnosis not present

## 2023-10-24 ENCOUNTER — Emergency Department (HOSPITAL_BASED_OUTPATIENT_CLINIC_OR_DEPARTMENT_OTHER)
Admission: EM | Admit: 2023-10-24 | Discharge: 2023-10-25 | Disposition: A | Discharge status: Home / Self Care | Payer: HMO | Attending: Emergency Medicine | Admitting: Emergency Medicine

## 2023-10-24 ENCOUNTER — Other Ambulatory Visit: Payer: Self-pay

## 2023-10-24 DIAGNOSIS — S0990XA Unspecified injury of head, initial encounter: Secondary | ICD-10-CM | POA: Diagnosis present

## 2023-10-24 DIAGNOSIS — R0781 Pleurodynia: Secondary | ICD-10-CM | POA: Insufficient documentation

## 2023-10-24 DIAGNOSIS — S060XAA Concussion with loss of consciousness status unknown, initial encounter: Secondary | ICD-10-CM | POA: Diagnosis not present

## 2023-10-24 DIAGNOSIS — S60221A Contusion of right hand, initial encounter: Secondary | ICD-10-CM | POA: Insufficient documentation

## 2023-10-24 DIAGNOSIS — Z79899 Other long term (current) drug therapy: Secondary | ICD-10-CM | POA: Diagnosis not present

## 2023-10-24 DIAGNOSIS — S0031XA Abrasion of nose, initial encounter: Secondary | ICD-10-CM | POA: Diagnosis not present

## 2023-10-24 DIAGNOSIS — S60222A Contusion of left hand, initial encounter: Secondary | ICD-10-CM | POA: Diagnosis not present

## 2023-10-24 DIAGNOSIS — Y92007 Garden or yard of unspecified non-institutional (private) residence as the place of occurrence of the external cause: Secondary | ICD-10-CM | POA: Insufficient documentation

## 2023-10-24 DIAGNOSIS — Z7982 Long term (current) use of aspirin: Secondary | ICD-10-CM | POA: Insufficient documentation

## 2023-10-24 DIAGNOSIS — W101XXA Fall (on)(from) sidewalk curb, initial encounter: Secondary | ICD-10-CM | POA: Diagnosis not present

## 2023-10-24 DIAGNOSIS — W19XXXA Unspecified fall, initial encounter: Secondary | ICD-10-CM

## 2023-10-24 DIAGNOSIS — S060X0A Concussion without loss of consciousness, initial encounter: Secondary | ICD-10-CM | POA: Diagnosis not present

## 2023-10-24 NOTE — ED Triage Notes (Addendum)
Pt POV with daughter after unwitnessed mechanical fall in yard, fell forward onto pavement. Abrasions noted to hands and scratches on face from glasses falling off. No blood thinners or LOC, reporting soreness around R ribcage, not tender to touch. Axo x4, per wife pt is repeating himself.

## 2023-10-25 ENCOUNTER — Emergency Department (HOSPITAL_BASED_OUTPATIENT_CLINIC_OR_DEPARTMENT_OTHER): Payer: HMO | Admitting: Radiology

## 2023-10-25 ENCOUNTER — Emergency Department (HOSPITAL_BASED_OUTPATIENT_CLINIC_OR_DEPARTMENT_OTHER): Payer: HMO

## 2023-10-25 ENCOUNTER — Encounter (HOSPITAL_BASED_OUTPATIENT_CLINIC_OR_DEPARTMENT_OTHER): Payer: Self-pay | Admitting: Emergency Medicine

## 2023-10-25 DIAGNOSIS — Z043 Encounter for examination and observation following other accident: Secondary | ICD-10-CM | POA: Diagnosis not present

## 2023-10-25 DIAGNOSIS — G319 Degenerative disease of nervous system, unspecified: Secondary | ICD-10-CM | POA: Diagnosis not present

## 2023-10-25 DIAGNOSIS — R519 Headache, unspecified: Secondary | ICD-10-CM | POA: Diagnosis not present

## 2023-10-25 DIAGNOSIS — I6782 Cerebral ischemia: Secondary | ICD-10-CM | POA: Diagnosis not present

## 2023-10-25 LAB — CBC WITH DIFFERENTIAL/PLATELET
Abs Immature Granulocytes: 0.08 10*3/uL — ABNORMAL HIGH (ref 0.00–0.07)
Basophils Absolute: 0 10*3/uL (ref 0.0–0.1)
Basophils Relative: 0 %
Eosinophils Absolute: 0.1 10*3/uL (ref 0.0–0.5)
Eosinophils Relative: 1 %
HCT: 38.4 % — ABNORMAL LOW (ref 39.0–52.0)
Hemoglobin: 12.6 g/dL — ABNORMAL LOW (ref 13.0–17.0)
Immature Granulocytes: 1 %
Lymphocytes Relative: 10 %
Lymphs Abs: 1.1 10*3/uL (ref 0.7–4.0)
MCH: 29.1 pg (ref 26.0–34.0)
MCHC: 32.8 g/dL (ref 30.0–36.0)
MCV: 88.7 fL (ref 80.0–100.0)
Monocytes Absolute: 0.7 10*3/uL (ref 0.1–1.0)
Monocytes Relative: 6 %
Neutro Abs: 9.1 10*3/uL — ABNORMAL HIGH (ref 1.7–7.7)
Neutrophils Relative %: 82 %
Platelets: 206 10*3/uL (ref 150–400)
RBC: 4.33 MIL/uL (ref 4.22–5.81)
RDW: 13.7 % (ref 11.5–15.5)
WBC: 11 10*3/uL — ABNORMAL HIGH (ref 4.0–10.5)
nRBC: 0 % (ref 0.0–0.2)

## 2023-10-25 LAB — URINALYSIS, ROUTINE W REFLEX MICROSCOPIC
Bilirubin Urine: NEGATIVE
Glucose, UA: NEGATIVE mg/dL
Hgb urine dipstick: NEGATIVE
Ketones, ur: NEGATIVE mg/dL
Leukocytes,Ua: NEGATIVE
Nitrite: NEGATIVE
Protein, ur: NEGATIVE mg/dL
Specific Gravity, Urine: 1.023 (ref 1.005–1.030)
pH: 6 (ref 5.0–8.0)

## 2023-10-25 LAB — COMPREHENSIVE METABOLIC PANEL
ALT: 28 U/L (ref 0–44)
AST: 19 U/L (ref 15–41)
Albumin: 4.3 g/dL (ref 3.5–5.0)
Alkaline Phosphatase: 37 U/L — ABNORMAL LOW (ref 38–126)
Anion gap: 7 (ref 5–15)
BUN: 21 mg/dL (ref 8–23)
CO2: 28 mmol/L (ref 22–32)
Calcium: 10.1 mg/dL (ref 8.9–10.3)
Chloride: 104 mmol/L (ref 98–111)
Creatinine, Ser: 0.88 mg/dL (ref 0.61–1.24)
GFR, Estimated: >60 mL/min (ref >60)
Glucose, Bld: 108 mg/dL — ABNORMAL HIGH (ref 70–99)
Potassium: 4.5 mmol/L (ref 3.5–5.1)
Sodium: 139 mmol/L (ref 135–145)
Total Bilirubin: 0.4 mg/dL (ref <1.2)
Total Protein: 6.5 g/dL (ref 6.5–8.1)

## 2023-10-25 MED ORDER — ACETAMINOPHEN 500 MG PO TABS
1000.0000 mg | ORAL_TABLET | Freq: Once | ORAL | Status: AC
  Administered 2023-10-25: 1000 mg via ORAL
  Filled 2023-10-25: qty 2

## 2023-10-25 NOTE — ED Provider Notes (Signed)
[facility]ERGENC[facility] DEPARTMENT AT [facility] Provider Note   CSN: [id number] Arrival date & time: 10/24/23  2225     History  Chief Complaint  Patient presents with   Mitchell Humphrey is a 82 y.o. male.  82 year old male who presents ER today after a fall.  He fell earlier today.  He stepped off of a ledge from the sidewalk into the grass and lost his balance.  He is unsure of exactly what happened in the fall but he did break his glasses and has some abrasions on his nose, right cheek and forehead that are minimal.  No blood thinners.  Also has some pain in his right ribs and bruises on his hands.  States the right ribs hurt but the bruises on his hands are not too bad.  His daughter is with him and states that his mother is concerned about a head injury secondary to confusion about the fall, but if questioning and also some other memory issues since the fall.  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   Fall       Home Medications Prior to Admission medications   Medication Sig Start Date End Date Taking? Authorizing Provider  acetaminophen (TYLENOL) 500 MG tablet Take 1,000 mg by mouth every 6 (six) hours as needed for moderate pain.    [provider]  amLODipine (NORVASC) 5 MG tablet Take 1 tablet (5 mg total) by mouth daily. 06/02/23   Janetta Hora, PA-C  aspirin EC 81 MG tablet Take 81 mg by mouth daily. Swallow whole.    [provider]  atorvastatin (LIPITOR) 20 MG tablet Take 1 tablet (20 mg total) by mouth in the morning and at bedtime. 06/16/23   O'NealRonnald Ramp, MD  cetirizine (ZYRTEC) 10 MG tablet Take 10 mg by mouth daily.    [provider]  Cholecalciferol (VITAMIN D3) 125 MCG (5000 UT) CAPS Take 5,000 Units by mouth daily.    [provider]  Cyanocobalamin (VITAMIN B 12 PO) Take 1 capsule by mouth daily in the afternoon.    [provider]  diclofenac Sodium (VOLTAREN) 1  % GEL Apply 2 g topically daily as needed (pain).    [provider]  doxycycline (VIBRA-TABS) 100 MG tablet Take 1 tablet (100 mg total) by mouth 2 (two) times daily. 12/16/22   Jarold Motto, PA  ELDERBERRY PO Take 1 capsule by mouth daily.    [provider]  finasteride (PROSCAR) 5 MG tablet Take 1 tablet (5 mg total) by mouth daily. 10/05/23   Jarold Motto, PA  ipratropium (ATROVENT) 0.06 % nasal spray Place 2 sprays into both nostrils 4 (four) times daily. 05/05/23   Jarold Motto, PA  ketorolac (ACULAR) 0.5 % ophthalmic solution  08/05/22   [provider]  levothyroxine (SYNTHROID) 125 MCG tablet Take 1 tablet (125 mcg total) by mouth daily. 08/26/23   Jarold Motto, PA  losartan (COZAAR) 50 MG tablet Take 1 tablet (50 mg total) by mouth 2 (two) times daily. 03/16/23 12/12/23  Sande Rives, MD  meloxicam (MOBIC) 15 MG tablet TAKE 1 TABLET (15 MG TOTAL) BY MOUTH DAILY. 09/17/23   Jarold Motto, PA  Multiple Vitamins-Minerals (IMMUNE SUPPORT PO) Take 1 tablet by mouth daily. NHR Immune Protect    [provider]  oxybutynin (DITROPAN-XL) 5 MG 24 hr tablet Take 1 tablet (5 mg total) by mouth daily. 09/17/23  Polyethyl Glycol-Propyl Glycol (SYSTANE OP) Place 1 drop into both eyes daily as needed (irritation).    [provider]  predniSONE (STERAPRED UNI-PAK 21 TAB) 10 MG (21) TBPK tablet Take according to package instructions. Patient not taking: Reported on 09/14/2023 09/13/23     spironolactone (ALDACTONE) 25 MG tablet Take 1 tablet (25 mg total) by mouth daily. 12/11/22 12/06/23  Sande Rives, MD  tamsulosin (FLOMAX) 0.4 MG CAPS capsule Take 1 capsule (0.4 mg total) by mouth daily. 10/05/23   Jarold Motto, PA      Allergies    Penicillins    Review of Systems   Review of Systems  Physical Exam Updated Vital Signs BP 127/60   Pulse 85   Temp 97.6 F (36.4 C)   Resp 18   Ht 5\' 11"  (1.803 m)   Wt 113.4  kg   SpO2 94%   BMI 34.87 kg/m  Physical Exam Vitals and nursing note reviewed.  Constitutional:      Appearance: He is well-developed.  HENT:     Head: Normocephalic and atraumatic.  Eyes:     Pupils: Pupils are equal, round, and reactive to light.  Cardiovascular:     Rate and Rhythm: Normal rate.  Pulmonary:     Effort: Pulmonary effort is normal. No respiratory distress.  Abdominal:     General: There is no distension.  Musculoskeletal:        General: Normal range of motion.     Cervical back: Normal range of motion.     Comments: Pain to palpation of right anterior ribs, not laterally or posteriorly.  Left side is fine.  C/T/L-spine is nontender and no deformities noted.  Bilateral lower extremities are without pain or ecchymosis.  Hip and pelvis is stable without tenderness to compression.  He does have some ecchymosis on his right thenar eminence and left interphalangeal joint of his thumb that hurt with direct palpation but no limited range of motion.  Rest of upper extremities without evidence of trauma.  Neurological:     Mental Status: He is alert.     ED Results / Procedures / Treatments   Labs (all labs ordered are listed, but only abnormal results are displayed) Labs Reviewed  CBC WITH DIFFERENTIAL/PLATELET - Abnormal; Notable for the following components:      Result Value   WBC 11.0 (*)    Hemoglobin 12.6 (*)    HCT 38.4 (*)    Neutro Abs 9.1 (*)    Abs Immature Granulocytes 0.08 (*)    All other components within normal limits  COMPREHENSIVE METABOLIC PANEL - Abnormal; Notable for the following components:   Glucose, Bld 108 (*)    Alkaline Phosphatase 37 (*)    All other components within normal limits  URINALYSIS, ROUTINE W REFLEX MICROSCOPIC    EKG EKG Interpretation Date/Time:  Sunday October 24 2023 22:38:27 EST Ventricular Rate:  84 PR Interval:    QRS Duration:  90 QT Interval:  362 QTC Calculation: 427 R Axis:   2  Text  Interpretation: Accelerated Junctional rhythm Minimal voltage criteria for LVH, may be normal variant ( R in aVL ) Abnormal ECG When compared with ECG of 14-Sep-2023 09:30, Junctional rhythm has replaced Sinus rhythm it appears patient has p waves preceding qrs's in at least V1 making junctional rhythm unlikely, appears sinus with decreased amplitude of p waves. Confirmed by Marily Memos [phone]) on 10/25/2023 1:53:07 AM  Radiology CT Head Wo Contrast  Result Date: 10/25/2023  CLINICAL DATA:  Recent fall with headaches and facial pain, initial encounter EXAM: CT HEAD WITHOUT CONTRAST CT MAXILLOFACIAL WITHOUT CONTRAST CT CERVICAL SPINE WITHOUT CONTRAST TECHNIQUE: Multidetector CT imaging of the head, cervical spine, and maxillofacial structures were performed using the standard protocol without intravenous contrast. Multiplanar CT image reconstructions of the cervical spine and maxillofacial structures were also generated. RADIATION DOSE REDUCTION: This exam was performed according to the departmental dose-optimization program which includes automated exposure control, adjustment of the mA and/or kV according to patient size and/or use of iterative reconstruction technique. COMPARISON:  01/05/2023, 03/17/2023 FINDINGS: CT HEAD FINDINGS Brain: No evidence of acute infarction, hemorrhage, hydrocephalus, extra-axial collection or mass lesion/mass effect. Chronic atrophic and ischemic changes are noted. Vascular: No hyperdense vessel or unexpected calcification. Skull: Normal. Negative for fracture or focal lesion. Other: None. 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 The overall appearance is stable from multiple previous exams. Degenerative changes of the temporomandibular joints are noted worse on the right than the left. Orbits: Orbits and their contents are within normal limits. Sinuses: Paranasal sinuses show no acute abnormality. Soft tissues:  Surrounding soft tissue structures are unremarkable. CT CERVICAL SPINE FINDINGS Alignment: Within normal limits. Skull base and vertebrae: The cervical segments are well visualized. Vertebral body height is well maintained. Multilevel osteophytic and facet hypertrophic changes are seen. No acute fracture or acute facet abnormality is noted. The odontoid is within normal limits. Soft tissues and spinal canal: Surrounding soft tissue structures are unremarkable. Upper chest: Visualized lung apices are within normal limits. Other: None IMPRESSION: CT of the head: No acute intracranial abnormality noted. Chronic atrophic and ischemic changes are seen. 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 No acute fracture is noted. Degenerative changes of the temporomandibular joints right worse than left. CT of the cervical spine: Degenerative change without acute abnormality. Electronically Signed   By: Alcide Clever M.D.   On: 10/25/2023 01:23   CT Cervical Spine Wo Contrast  Result Date: 10/25/2023 CLINICAL DATA:  Recent fall with headaches and facial pain, initial encounter EXAM: CT HEAD WITHOUT CONTRAST CT MAXILLOFACIAL WITHOUT CONTRAST CT CERVICAL SPINE WITHOUT CONTRAST TECHNIQUE: Multidetector CT imaging of the head, cervical spine, and maxillofacial structures were performed using the standard protocol without intravenous contrast. Multiplanar CT image reconstructions of the cervical spine and maxillofacial structures were also generated. RADIATION DOSE REDUCTION: This exam was performed according to the departmental dose-optimization program which includes automated exposure control, adjustment of the mA and/or kV according to patient size and/or use of iterative reconstruction technique. COMPARISON:  01/05/2023, 03/17/2023 FINDINGS: CT HEAD FINDINGS Brain: No evidence of acute infarction, hemorrhage, hydrocephalus, extra-axial collection or mass lesion/mass effect.  Chronic atrophic and ischemic changes are noted. Vascular: No hyperdense vessel or unexpected calcification. Skull: Normal. Negative for fracture or focal lesion. Other: None. 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 The overall appearance is stable from multiple previous exams. Degenerative changes of the temporomandibular joints are noted worse on the right than the left. Orbits: Orbits and their contents are within normal limits. Sinuses: Paranasal sinuses show no acute abnormality. Soft tissues: Surrounding soft tissue structures are unremarkable. CT CERVICAL SPINE FINDINGS Alignment: Within normal limits. Skull base and vertebrae: The cervical segments are well visualized. Vertebral body height is well maintained. Multilevel osteophytic and facet hypertrophic changes are seen. No acute fracture or acute facet abnormality is noted. The  odontoid is within normal limits. Soft tissues and spinal canal: Surrounding soft tissue structures are unremarkable. Upper chest: Visualized lung apices are within normal limits. Other: None IMPRESSION: CT of the head: No acute intracranial abnormality noted. Chronic atrophic and ischemic changes are seen. 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 No acute fracture is noted. Degenerative changes of the temporomandibular joints right worse than left. CT of the cervical spine: Degenerative change without acute abnormality. Electronically Signed   By: Alcide Clever M.D.   On: 10/25/2023 01:23   CT Maxillofacial Wo Contrast  Result Date: 10/25/2023 CLINICAL DATA:  Recent fall with headaches and facial pain, initial encounter EXAM: CT HEAD WITHOUT CONTRAST CT MAXILLOFACIAL WITHOUT CONTRAST CT CERVICAL SPINE WITHOUT CONTRAST TECHNIQUE: Multidetector CT imaging of the head, cervical spine, and maxillofacial structures were performed using the standard protocol without  intravenous contrast. Multiplanar CT image reconstructions of the cervical spine and maxillofacial structures were also generated. RADIATION DOSE REDUCTION: This exam was performed according to the departmental dose-optimization program which includes automated exposure control, adjustment of the mA and/or kV according to patient size and/or use of iterative reconstruction technique. COMPARISON:  01/05/2023, 03/17/2023 FINDINGS: CT HEAD FINDINGS Brain: No evidence of acute infarction, hemorrhage, hydrocephalus, extra-axial collection or mass lesion/mass effect. Chronic atrophic and ischemic changes are noted. Vascular: No hyperdense vessel or unexpected calcification. Skull: Normal. Negative for fracture or focal lesion. Other: None. 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 The overall appearance is stable from multiple previous exams. Degenerative changes of the temporomandibular joints are noted worse on the right than the left. Orbits: Orbits and their contents are within normal limits. Sinuses: Paranasal sinuses show no acute abnormality. Soft tissues: Surrounding soft tissue structures are unremarkable. CT CERVICAL SPINE FINDINGS Alignment: Within normal limits. Skull base and vertebrae: The cervical segments are well visualized. Vertebral body height is well maintained. Multilevel osteophytic and facet hypertrophic changes are seen. No acute fracture or acute facet abnormality is noted. The odontoid is within normal limits. Soft tissues and spinal canal: Surrounding soft tissue structures are unremarkable. Upper chest: Visualized lung apices are within normal limits. Other: None IMPRESSION: CT of the head: No acute intracranial abnormality noted. Chronic atrophic and ischemic changes are seen. 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 No acute fracture is noted. Degenerative changes of the  temporomandibular joints right worse than left. CT of the cervical spine: Degenerative change without acute abnormality. Electronically Signed   By: Alcide Clever M.D.   On: 10/25/2023 01:23   DG Ribs Unilateral W/Chest Right  Result Date: 10/25/2023 CLINICAL DATA:  Unwitnessed fall EXAM: RIGHT RIBS AND CHEST - 3+ VIEW COMPARISON:  05/22/2022 FINDINGS: Low lung volumes. Lungs are clear. No pleural effusion or pneumothorax. The heart is normal in size. No displaced right rib fracture is seen. IMPRESSION: Negative. Electronically Signed   By: Charline Bills M.D.   On: 10/25/2023 01:18    Procedures Procedures    Medications Ordered in ED Medications  acetaminophen (TYLENOL) tablet 1,000 mg (1,000 mg Oral Given 10/25/23 0227)    ED Course/ Medical Decision Making/ A&P                                 Medical Decision Making Amount and/or Complexity of Data Reviewed Labs: ordered. ECG/medicine tests: ordered.  Risk OTC drugs.   Will x-ray and CT affected body parts.  Will also check basic labs and urine to make sure weakness did not contribute to the fall in the mental status change since the fall.  Suspect likely postconcussive syndrome but will rule out emergencies first.  No obvious traumatic injuries. Will treat rib contusion/occult fracture with ibuprofen/tylenol at home. Will do brain rest tomorrow and if better, back to normal activities Tuesday/Wednesday, will follow up with PCP for balance/memory issues if persistent after headaches improved. Discussed breathing exercises to reduce chances of atelectasis and pneumonia.    Final Clinical Impression(s) / ED Diagnoses Final diagnoses:  Fall, initial encounter  Concussion with unknown loss of consciousness status, initial encounter    Rx / DC Orders ED Discharge Orders     None         Elanda Garmany, Barbara Cower, MD 10/25/23 [phone]

## 2023-11-05 ENCOUNTER — Other Ambulatory Visit (HOSPITAL_COMMUNITY): Payer: Self-pay

## 2023-11-15 ENCOUNTER — Ambulatory Visit: Payer: Self-pay | Admitting: Physician Assistant

## 2023-11-15 NOTE — Telephone Encounter (Signed)
FYI, Pt scheduled to see Dr. Jimmey Ralph tomorrow at 8:20.

## 2023-11-15 NOTE — Telephone Encounter (Signed)
Copied from CRM [id number]. Topic: Clinical - Red Word Triage >> Nov 15, 2023 10:49 AM Efraim Kaufmann C wrote: Red Word that prompted transfer to Nurse Triage: patient had a fall, hurt ribs, and has swelling in his feet   Chief Complaint: Swelling  Symptoms: Swelling in both feet/ankles Frequency: Ongoing for one week Pertinent Negatives: Patient denies pain Disposition: [] ED /[] Urgent Care (no appt availability in office) / [x] Appointment(In office/virtual)/ []  [place] [place] [facility]/ [] Home Care/ [] Refused Recommended Disposition /[] [facility]/ []  Follow-up with PCP  Additional Notes: Patient stated he is having swelling and redness in both feet and ankles for 1 week. Cause is unknown. He denies pain. Patient also stated he had a fall at the beginning of this month and was evaluated at the ED. He still has rib pain from the incident. He is taking Tylenol, but it is not helping. Appointment scheduled for tomorrow.  Reason for Disposition  MILD or MODERATE ankle swelling (e.g., can't move joint normally, can't do usual activities) (Exceptions: Itchy, localized swelling; swelling is chronic.)  Answer Assessment - Initial Assessment Questions 1. LOCATION: "Which ankle is swollen?" "Where is the swelling?"     Both ankles and feet, right side might be a little worse  2. ONSET: "When did the swelling start?"     1 week ago  3. SWELLING: "How bad is the swelling?" Or, "How large is it?" (e.g., mild, moderate, severe; size of localized swelling)    - NONE: No joint swelling.   - LOCALIZED: Localized; small area of puffy or swollen skin (e.g., insect bite, skin irritation).   - MILD: Joint looks or feels mildly swollen or puffy.   - MODERATE: Swollen; interferes with normal activities (e.g., work or school); decreased range of movement; may be limping.   - SEVERE: Very swollen; can't move swollen joint at all; limping a lot or unable to walk.     Very swollen, hard to get shoes on. Patient  is still able to walk okay.  4. PAIN: "Is there any pain?" If Yes, ask: "How bad is it?" (Scale 1-10; or mild, moderate, severe)   - NONE (0): no pain.   - MILD (1-3): doesn't interfere with normal activities.    - MODERATE (4-7): interferes with normal activities (e.g., work or school) or awakens from sleep, limping.    - SEVERE (8-10): excruciating pain, unable to do any normal activities, unable to walk.      No pain  5. CAUSE: "What do you think caused the ankle swelling?"     Unknown  6. OTHER SYMPTOMS: "Do you have any other symptoms?" (e.g., fever, chest pain, difficulty breathing, calf pain)     Rib pain (already been evaluated at hospital after a fall)  Protocols used: Ankle Swelling-A-AH

## 2023-11-16 ENCOUNTER — Encounter: Payer: Self-pay | Admitting: Family Medicine

## 2023-11-16 ENCOUNTER — Ambulatory Visit (INDEPENDENT_AMBULATORY_CARE_PROVIDER_SITE_OTHER): Payer: HMO | Admitting: Family Medicine

## 2023-11-16 VITALS — BP 132/78 | HR 75 | Temp 97.7°F | Ht 71.0 in | Wt 263.6 lb

## 2023-11-16 DIAGNOSIS — I10 Essential (primary) hypertension: Secondary | ICD-10-CM

## 2023-11-16 DIAGNOSIS — R0781 Pleurodynia: Secondary | ICD-10-CM

## 2023-11-16 DIAGNOSIS — R600 Localized edema: Secondary | ICD-10-CM

## 2023-11-16 NOTE — Progress Notes (Signed)
   Mitchell Humphrey is a 82 y.o. male who presents today for an office visit.  Assessment/Plan:  New/Acute Problems: Leg Edema Likely multifactorial however his amlodipine  is likely contributing significantly.  May have small amount of underlying venous insufficiency as well.  He has no signs of pulmonary edema today on history or exam and his EF on echocardiogram from a few months ago was normal - doubt CHF.  Wells score -1. Doubt DVT.  Recently had labs in the ED which were stable - do not need to repeat.  We will stop amlodipine  today.  He will follow-up with me or his PCP in a week.  If still has persisting edema at that point will need further workup including labs and possible imaging.  We discussed reasons to return to care and seek emergent care..  Rib Pain No fractures on recent x-ray.  Likely has contusion.  This is improved.  He can continue taking Tylenol  as needed.  Chronic Problems Addressed Today: Essential hypertension At goal today however concerned that his amlodipine  may be contributing to his lower extremity edema.  We are stopping this today.  He will continue his other medications including losartan  50 mg twice daily and spironolactone  25 mg daily.  Did discuss that his blood pressure may increase with stopping the amlodipine .  He will monitor at home and follow-up with us  in 1 week.  Blood pressures are at goal would consider increasing dose of spironolactone  versus adding additional antihypertensive such as a thiazide or beta-blocker.    Subjective:  HPI:  See A/P for status of chronic conditions.    Patient here today with concern for bilateral leg swelling for the last week.  No obvious precipitating events.  Pain is equal in both legs.  No shortness of breath.  No orthopnea.  He did have a fall several weeks ago and has had some right sided rib pain but this is improving.  He was seen in the ED for this and had workup including EKG, labs, and imaging.  X-ray did not show  any rib fractures.  CT head and neck was negative as well.  He has been managing his pain with Tylenol .  Not had any other recent injuries or illnesses.  No medication changes.  No dietary changes.        Objective:  Physical Exam: BP 132/78   Pulse 75   Temp 97.7 F (36.5 C) (Temporal)   Ht 5' 11 (1.803 m)   Wt 263 lb 9.6 oz (119.6 kg)   SpO2 98%   BMI 36.76 kg/m   Wt Readings from Last 3 Encounters:  11/16/23 263 lb 9.6 oz (119.6 kg)  10/24/23 250 lb (113.4 kg)  09/14/23 259 lb (117.5 kg)    Gen: No acute distress, resting comfortably CV: Regular rate and rhythm with no murmurs appreciated Pulm: Normal work of breathing, clear to auscultation bilaterally with no crackles, wheezes, or rhonchi MUSCULOSKELETAL: Bilateral legs with 2+ pitting edema to knees.  Neurovascular intact distally Neuro: Grossly normal, moves all extremities Psych: Normal affect and thought content      Mitchell Humphrey M. Kennyth, MD 11/16/2023 8:40 AM

## 2023-11-16 NOTE — Patient Instructions (Signed)
 It was very nice to see you today!  I think your leg swelling is probably due to the amlodipine .  Please stop this medication.  Follow-up with me or your PCP in 1 week to let us  know how you are doing.  Return in about 1 week (around 11/23/2023).   Take care, Dr Kennyth  PLEASE NOTE:  If you had any lab tests, please let us  know if you have not heard back within a few days. You may see your results on mychart before we have a chance to review them but we will give you a call once they are reviewed by us .   If we ordered any referrals today, please let us  know if you have not heard from their office within the next week.   If you had any urgent prescriptions sent in today, please check with the pharmacy within an hour of our visit to make sure the prescription was transmitted appropriately.   Please try these tips to maintain a healthy lifestyle:  Eat at least 3 REAL meals and 1-2 snacks per day.  Aim for no more than 5 hours between eating.  If you eat breakfast, please do so within one hour of getting up.   Each meal should contain half fruits/vegetables, one quarter protein, and one quarter carbs (no bigger than a computer mouse)  Cut down on sweet beverages. This includes juice, soda, and sweet tea.   Drink at least 1 glass of water with each meal and aim for at least 8 glasses per day  Exercise at least 150 minutes every week.

## 2023-11-19 ENCOUNTER — Other Ambulatory Visit (HOSPITAL_COMMUNITY): Payer: Self-pay

## 2023-11-22 ENCOUNTER — Other Ambulatory Visit (HOSPITAL_COMMUNITY): Payer: Self-pay

## 2023-11-23 ENCOUNTER — Ambulatory Visit (INDEPENDENT_AMBULATORY_CARE_PROVIDER_SITE_OTHER): Payer: HMO | Admitting: Physician Assistant

## 2023-11-23 ENCOUNTER — Other Ambulatory Visit: Payer: Self-pay | Admitting: Physician Assistant

## 2023-11-23 ENCOUNTER — Other Ambulatory Visit (HOSPITAL_COMMUNITY): Payer: Self-pay

## 2023-11-23 ENCOUNTER — Encounter: Payer: Self-pay | Admitting: Physician Assistant

## 2023-11-23 VITALS — BP 136/70 | HR 66 | Temp 97.5°F | Ht 71.0 in | Wt 260.0 lb

## 2023-11-23 DIAGNOSIS — M545 Low back pain, unspecified: Secondary | ICD-10-CM | POA: Diagnosis not present

## 2023-11-23 DIAGNOSIS — G8929 Other chronic pain: Secondary | ICD-10-CM | POA: Diagnosis not present

## 2023-11-23 DIAGNOSIS — E039 Hypothyroidism, unspecified: Secondary | ICD-10-CM

## 2023-11-23 DIAGNOSIS — I10 Essential (primary) hypertension: Secondary | ICD-10-CM | POA: Diagnosis not present

## 2023-11-23 DIAGNOSIS — R600 Localized edema: Secondary | ICD-10-CM | POA: Diagnosis not present

## 2023-11-23 DIAGNOSIS — R8290 Unspecified abnormal findings in urine: Secondary | ICD-10-CM

## 2023-11-23 LAB — COMPREHENSIVE METABOLIC PANEL
ALT: 17 U/L (ref 0–53)
AST: 15 U/L (ref 0–37)
Albumin: 4.5 g/dL (ref 3.5–5.2)
Alkaline Phosphatase: 64 U/L (ref 39–117)
BUN: 19 mg/dL (ref 6–23)
CO2: 27 meq/L (ref 19–32)
Calcium: 9.8 mg/dL (ref 8.4–10.5)
Chloride: 103 meq/L (ref 96–112)
Creatinine, Ser: 0.85 mg/dL (ref 0.40–1.50)
GFR: 81.65 mL/min (ref >60.00)
Glucose, Bld: 115 mg/dL — ABNORMAL HIGH (ref 70–99)
Potassium: 4.4 meq/L (ref 3.5–5.1)
Sodium: 139 meq/L (ref 135–145)
Total Bilirubin: 0.4 mg/dL (ref 0.2–1.2)
Total Protein: 6.6 g/dL (ref 6.0–8.3)

## 2023-11-23 LAB — CBC WITH DIFFERENTIAL/PLATELET
Basophils Absolute: 0 10*3/uL (ref 0.0–0.1)
Basophils Relative: 0.4 % (ref 0.0–3.0)
Eosinophils Absolute: 0.6 10*3/uL (ref 0.0–0.7)
Eosinophils Relative: 7.9 % — ABNORMAL HIGH (ref 0.0–5.0)
HCT: 37.8 % — ABNORMAL LOW (ref 39.0–52.0)
Hemoglobin: 12.6 g/dL — ABNORMAL LOW (ref 13.0–17.0)
Lymphocytes Relative: 28.5 % (ref 12.0–46.0)
Lymphs Abs: 2.1 10*3/uL (ref 0.7–4.0)
MCHC: 33.2 g/dL (ref 30.0–36.0)
MCV: 87.2 fL (ref 78.0–100.0)
Monocytes Absolute: 0.6 10*3/uL (ref 0.1–1.0)
Monocytes Relative: 8.5 % (ref 3.0–12.0)
Neutro Abs: 4.1 10*3/uL (ref 1.4–7.7)
Neutrophils Relative %: 54.7 % (ref 43.0–77.0)
Platelets: 253 10*3/uL (ref 150.0–400.0)
RBC: 4.34 Mil/uL (ref 4.22–5.81)
RDW: 14.3 % (ref 11.5–15.5)
WBC: 7.5 10*3/uL (ref 4.0–10.5)

## 2023-11-23 LAB — TSH: TSH: 3.02 u[IU]/mL (ref 0.35–5.50)

## 2023-11-23 LAB — URINALYSIS, ROUTINE W REFLEX MICROSCOPIC
Bilirubin Urine: NEGATIVE
Hgb urine dipstick: NEGATIVE
Ketones, ur: NEGATIVE
Nitrite: POSITIVE — AB
Specific Gravity, Urine: 1.02 (ref 1.000–1.030)
Total Protein, Urine: NEGATIVE
Urine Glucose: NEGATIVE
Urobilinogen, UA: 0.2 (ref 0.0–1.0)
pH: 6 (ref 5.0–8.0)

## 2023-11-23 MED ORDER — FUROSEMIDE 20 MG PO TABS
20.0000 mg | ORAL_TABLET | Freq: Every day | ORAL | 3 refills | Status: DC | PRN
  Filled 2023-11-23: qty 30, 30d supply, fill #0

## 2023-11-23 NOTE — Patient Instructions (Signed)
 It was great to see you!  We are going to get more blood work to further evaluate your symptom(s)  I will be in touch with lab results and the plan; I will also send your cardiologist a message about these issues you are having  May start daily as needed furosemide  (lasix ) for lower extremity swelling in the meantime -- this will pull a lot of fluid off of you so please be mindful and try not to take to close to bedtime.  Call us  Anytime you have any of the following symptoms:  1) 3 pound weight gain in 24 hours or 5 pounds in 1 week  2) shortness of breath, with or without a dry hacking cough  3) swelling in the hands, feet or stomach  4) if you have to sleep on extra pillows at night in order to breathe.   Let's follow-up in 1-2 weeks, sooner if you have concerns.  Take care,  Lucie Buttner PA-C

## 2023-11-23 NOTE — Progress Notes (Signed)
 Mitchell Humphrey is a 83 y.o. male here for a follow up of a pre-existing problem.  History of Present Illness:   Chief Complaint  Patient presents with  . Balance issues    Pt having balance issues, feet swollen    HPI  HTN  He reports compliance and good tolerance of losartan  50 mg twice daily and spironolactone  25 mg daily.  Patient denies chest pain, SOB, blurred vision, dizziness, unusual headaches, lower leg swelling. Patient is not on medication. Denies excessive caffeine intake, stimulant usage, excessive alcohol intake, or increase in salt consumption. He states that his at home blood pressure readings are typically around 130-150 and they tend to be on the higher end before going to sleep.  Denies any urination concerns, skin lesions, or coughs.   Leg Edema  Continues to complain of constant swelling in both legs. His symptoms are however improving since his last visit.  His edema is typically worse in the morning.  He typically tries to elevate his legs throughout the day and wear compression socks. He does report feeling bloated and experiencing bunions with a burning sensations on his feet.  He states that he has gained several pounds in the past few months but has lost 4-5 pounds since his last office visit with Dr. Kennyth.   Back Pain  He does complain of back pain but states that he's following up with an orthopedist.  Rib pain from previous injury has been improving since his last office visit.   He can't recall the details of the fall or how it occurred.  He states that he has been sleeping on a recliner for the past month due to his rib injury.  His walks have also decreased due to the injury and the weather.   Past Medical History:  Diagnosis Date  . Allergy   . Arthritis   . BPH (benign prostatic hyperplasia)   . Cataract   . DVT (deep venous thrombosis) (HCC) 12/17/2019   left gastrocnemius DVT in setting of COVID-19  . Dyslipidemia   . Erectile dysfunction    . GERD (gastroesophageal reflux disease)   . Hx of adenomatous colonic polyps   . Hyperlipidemia   . Hypertension   . Hypogonadism male   . Hypothyroid   . Obesity   . 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 Verlin and Dr. Lucas  . Severe aortic stenosis      Social History   Tobacco Use  . Smoking status: Never  . Smokeless tobacco: Never  . Tobacco comments:    He chews on a cigar  Vaping Use  . Vaping status: Never Used  Substance Use Topics  . Alcohol use: No  . Drug use: No    Past Surgical History:  Procedure Laterality Date  . APPENDECTOMY    . COLONOSCOPY  08/2008   Dr. Rosalie  . HERNIA REPAIR  1970   [facility]  . INTRAOPERATIVE TRANSTHORACIC ECHOCARDIOGRAM N/A 05/26/2022   Procedure: INTRAOPERATIVE TRANSTHORACIC ECHOCARDIOGRAM;  Surgeon: Verlin Lonni BIRCH, MD;  Location: [facility];  Service: Open Heart Surgery;  Laterality: N/A;  . RIGHT HEART CATH AND CORONARY ANGIOGRAPHY N/A 03/11/2022   Procedure: RIGHT HEART CATH AND CORONARY ANGIOGRAPHY;  Surgeon: Verlin Lonni BIRCH, MD;  Location: [facility];  Service: Cardiovascular;  Laterality: N/A;  . TRANSCATHETER AORTIC VALVE REPLACEMENT, TRANSFEMORAL N/A 05/26/2022   Procedure: Transcatheter Aortic Valve Replacement, Transfemoral;  Surgeon:  Verlin Lonni BIRCH, MD;  Location: [facility];  Service: Open Heart Surgery;  Laterality: N/A;    Family History  Problem Relation Age of Onset  . Breast cancer Mother   . Lung cancer Father   . Brain cancer Father   . Breast cancer Sister   . Heart attack Sister     Allergies  Allergen Reactions  . Penicillins Rash    Current Medications:   Current Outpatient Medications:  .  acetaminophen  (TYLENOL ) 500 MG tablet, Take 1,000 mg by mouth every 6 (six) hours as needed for moderate pain., Disp: , Rfl:  .  aspirin  EC 81 MG tablet, Take 81 mg by mouth daily. Swallow whole.,  Disp: , Rfl:  .  atorvastatin  (LIPITOR) 20 MG tablet, Take 1 tablet (20 mg total) by mouth in the morning and at bedtime., Disp: 180 tablet, Rfl: 3 .  cetirizine  (ZYRTEC ) 10 MG tablet, Take 10 mg by mouth daily., Disp: , Rfl:  .  Cholecalciferol (VITAMIN D3) 125 MCG (5000 UT) CAPS, Take 5,000 Units by mouth daily., Disp: , Rfl:  .  Cyanocobalamin (VITAMIN B 12 PO), Take 1 capsule by mouth daily in the afternoon., Disp: , Rfl:  .  diclofenac Sodium (VOLTAREN) 1 % GEL, Apply 2 g topically daily as needed (pain)., Disp: , Rfl:  .  ELDERBERRY PO, Take 1 capsule by mouth daily., Disp: , Rfl:  .  finasteride  (PROSCAR ) 5 MG tablet, Take 1 tablet (5 mg total) by mouth daily., Disp: 90 tablet, Rfl: 1 .  furosemide  (LASIX ) 20 MG tablet, Take 1 tablet (20 mg total) by mouth daily as needed for edema., Disp: 30 tablet, Rfl: 3 .  ipratropium (ATROVENT ) 0.06 % nasal spray, Place 2 sprays into both nostrils 4 (four) times daily., Disp: 15 mL, Rfl: 12 .  ketorolac (ACULAR) 0.5 % ophthalmic solution, , Disp: , Rfl:  .  levothyroxine  (SYNTHROID ) 125 MCG tablet, Take 1 tablet (125 mcg total) by mouth daily., Disp: 30 tablet, Rfl: 5 .  losartan  (COZAAR ) 50 MG tablet, Take 1 tablet (50 mg total) by mouth 2 (two) times daily., Disp: 180 tablet, Rfl: 3 .  meloxicam  (MOBIC ) 15 MG tablet, TAKE 1 TABLET (15 MG TOTAL) BY MOUTH DAILY., Disp: 90 tablet, Rfl: 1 .  Multiple Vitamins-Minerals (IMMUNE SUPPORT PO), Take 1 tablet by mouth daily. NHR Immune Protect, Disp: , Rfl:  .  oxybutynin  (DITROPAN -XL) 5 MG 24 hr tablet, Take 1 tablet (5 mg total) by mouth daily., Disp: 30 tablet, Rfl: 11 .  Polyethyl Glycol-Propyl Glycol (SYSTANE OP), Place 1 drop into both eyes daily as needed (irritation)., Disp: , Rfl:  .  spironolactone  (ALDACTONE ) 25 MG tablet, Take 1 tablet (25 mg total) by mouth daily., Disp: 90 tablet, Rfl: 2 .  tamsulosin  (FLOMAX ) 0.4 MG CAPS capsule, Take 1 capsule (0.4 mg total) by mouth daily., Disp: 90 capsule,  Rfl: 1   Review of Systems:   Review of Systems  Eyes:  Negative for blurred vision.  Respiratory:  Negative for cough and shortness of breath.   Cardiovascular:  Positive for leg swelling. Negative for chest pain.  Gastrointestinal:        +distention  Genitourinary:  Negative for urgency.  Musculoskeletal:  Positive for back pain.  Neurological:  Negative for dizziness (balance issues) and headaches.    Vitals:   Vitals:   11/23/23 1009  BP: 136/70  Pulse: 66  Temp: (!) 97.5 F (36.4 C)  TempSrc: Temporal  SpO2:  97%  Weight: 260 lb (117.9 kg)  Height: 5' 11 (1.803 m)     Body mass index is 36.26 kg/m.  Physical Exam:   Physical Exam Vitals and nursing note reviewed.  Constitutional:      General: He is not in acute distress.    Appearance: He is well-developed. He is not ill-appearing or toxic-appearing.  Cardiovascular:     Rate and Rhythm: Normal rate and regular rhythm.     Pulses: Normal pulses.     Heart sounds: Normal heart sounds, S1 normal and S2 normal.  Pulmonary:     Effort: Pulmonary effort is normal.     Breath sounds: Normal breath sounds.  Musculoskeletal:     Right lower leg: 1+ Pitting Edema present.     Left lower leg: 1+ Pitting Edema present.  Skin:    General: Skin is warm and dry.  Neurological:     Mental Status: He is alert.     GCS: GCS eye subscore is 4. GCS verbal subscore is 5. GCS motor subscore is 6.  Psychiatric:        Speech: Speech normal.        Behavior: Behavior normal. Behavior is cooperative.     Assessment and Plan:   Benign essential hypertension Normotensive, even with stopping amlodipine  Continue losartan  50 mg twice daily and spironolactone  25 mg daily Will add as needed furosemide  20 mg as needed for swelling Will also message his cardiologist about new lower extremity swelling Continue to monitor blood pressure at home  Leg edema Improved but ongoing Update blood work to rule out organic cause of  symptom(s) Will trial furosemide  20 mg daily as needed for fluid Keep legs elevated, trial compression stockings, limit salt Weight parameters provided  Close follow-up with us  or cardiology of monitor symptom(s)   Chronic low back pain, unspecified back pain laterality, unspecified whether sciatica present No red flags Ongoing management per orthopedic Continue to monitor and work on maintaining activity level as able  Hypothyroidism (acquired) Update TSH and provide recommendations to levothyroxine  125 mcg daily   I,Safa M Kadhim,acting as a scribe for [employer], PA.,have documented all relevant documentation on the behalf of Lucie Buttner, PA,as directed by  Lucie Buttner, PA while in the presence of Lucie Buttner, [place].   I, Lucie Buttner, [place], have reviewed all documentation for this visit. The documentation on 11/23/23 for the exam, diagnosis, procedures, and orders are all accurate and complete.   Lucie Buttner, PA-C

## 2023-11-24 NOTE — Telephone Encounter (Signed)
 Copied from CRM [id number]. Topic: General - Other >> Nov 24, 2023 11:13 AM Florestine Avers wrote: Reason for CRM: Patient had a missed called from "Lupita Leash", is requesting a call back.

## 2023-11-24 NOTE — Telephone Encounter (Signed)
 See result notes.

## 2023-11-26 ENCOUNTER — Ambulatory Visit: Payer: HMO | Attending: Physician Assistant | Admitting: Physician Assistant

## 2023-11-26 ENCOUNTER — Other Ambulatory Visit: Payer: HMO

## 2023-11-26 VITALS — BP 134/84 | HR 90 | Ht 71.0 in | Wt 258.4 lb

## 2023-11-26 DIAGNOSIS — R600 Localized edema: Secondary | ICD-10-CM | POA: Diagnosis not present

## 2023-11-26 DIAGNOSIS — Z952 Presence of prosthetic heart valve: Secondary | ICD-10-CM | POA: Diagnosis not present

## 2023-11-26 DIAGNOSIS — R8290 Unspecified abnormal findings in urine: Secondary | ICD-10-CM

## 2023-11-26 DIAGNOSIS — I10 Essential (primary) hypertension: Secondary | ICD-10-CM | POA: Diagnosis not present

## 2023-11-26 NOTE — Patient Instructions (Signed)
 Medication Instructions:  Start taking Lasix  every day   *If you need a refill on your cardiac medications before your next appointment, please call your pharmacy*   Lab Work: None ordered   If you have labs (blood work) drawn today and your tests are completely normal, you will receive your results only by: MyChart Message (if you have MyChart) OR A paper copy in the mail If you have any lab test that is abnormal or we need to change your treatment, we will call you to review the results.   Testing/Procedures: None ordered    Other Instructions

## 2023-11-26 NOTE — Progress Notes (Signed)
 [facility]   MULTIDISCIPLINARY HEART VALVE CLINIC                                     Cardiology Office Note:    Date:  11/28/2023   ID:  Mitchell Humphrey, DOB 1941-06-18, MRN [id number]  PCP:  Job Lukes, PA  [facility] Cardiologist:  Darryle ONEIDA Decent, MD / Dr, Verlin, MD and Dr. Lucas, MD (TAVR)  [facility] Electrophysiologist:  None   Referring MD: Job Lukes, PA   LE edema.   History of Present Illness:    Mitchell Humphrey is a 83 y.o. 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   Most recent echo 06/02/23 showed EF 60%, normally functioning TAVR with a mean gradient of 8 mm hg and no PVL. He has been doing well with no issues aside from a fall with possible concussion in December 2024. He was seen the ER and bruised right ribs.  He was seen by his PCP on 12/31 for new onset isolated LE edema. Norvasc  was discontinued with no improvement and started on PRN lasix  20mg .   Today the patient presents to clinic for follow up. He has been taking the lasix  everyday with good response. No CP or SOB. No LE edema, orthopnea or PND. No dizziness or syncope. No blood in stool or urine. No palpitations. Has had a slow recovery from fall in December.   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 Verlin and Dr. Lucas   Severe aortic stenosis     Past Surgical History:   Procedure Laterality Date   APPENDECTOMY     COLONOSCOPY  08/2008   Dr. Rosalie   HERNIA REPAIR  1970   [facility]   INTRAOPERATIVE TRANSTHORACIC ECHOCARDIOGRAM N/A 05/26/2022   Procedure: INTRAOPERATIVE TRANSTHORACIC ECHOCARDIOGRAM;  Surgeon: Verlin Lonni BIRCH, MD;  Location: [facility];  Service: Open Heart Surgery;  Laterality: N/A;   RIGHT HEART CATH AND CORONARY ANGIOGRAPHY N/A 03/11/2022   Procedure: RIGHT HEART CATH AND CORONARY ANGIOGRAPHY;  Surgeon: Verlin Lonni BIRCH, MD;  Location: [facility];  Service: Cardiovascular;  Laterality: N/A;   TRANSCATHETER AORTIC VALVE REPLACEMENT, TRANSFEMORAL N/A 05/26/2022   Procedure: Transcatheter Aortic Valve Replacement, Transfemoral;  Surgeon: Verlin Lonni BIRCH, MD;  Location: [facility];  Service: Open Heart Surgery;  Laterality: N/A;    Current Medications: Current Meds  Medication Sig   acetaminophen  (TYLENOL ) 500 MG tablet Take 1,000 mg by mouth every 6 (six) hours as needed for moderate pain.   aspirin  EC 81 MG tablet Take 81 mg by mouth daily. Swallow whole.   atorvastatin  (LIPITOR) 20 MG tablet Take 1 tablet (20 mg total) by mouth in the  morning and at bedtime.   cetirizine  (ZYRTEC ) 10 MG tablet Take 10 mg by mouth daily.   Cholecalciferol (VITAMIN D3) 125 MCG (5000 UT) CAPS Take 5,000 Units by mouth daily.   Cyanocobalamin (VITAMIN B 12 PO) Take 1 capsule by mouth daily in the afternoon.   diclofenac Sodium (VOLTAREN) 1 % GEL Apply 2 g topically daily as needed (pain).   ELDERBERRY PO Take 1 capsule by mouth daily.   finasteride  (PROSCAR ) 5 MG tablet Take 1 tablet (5 mg total) by mouth daily.   furosemide  (LASIX ) 20 MG tablet Take 20 mg by mouth daily.   ipratropium (ATROVENT ) 0.06 % nasal spray Place 2 sprays into both nostrils 4 (four) times daily.   ketorolac (ACULAR) 0.5 % ophthalmic solution    levothyroxine  (SYNTHROID ) 125 MCG tablet Take 1 tablet (125 mcg total) by mouth daily.   losartan   (COZAAR ) 50 MG tablet Take 1 tablet (50 mg total) by mouth 2 (two) times daily.   meloxicam  (MOBIC ) 15 MG tablet TAKE 1 TABLET (15 MG TOTAL) BY MOUTH DAILY.   Multiple Vitamins-Minerals (IMMUNE SUPPORT PO) Take 1 tablet by mouth daily. NHR Immune Protect   oxybutynin  (DITROPAN -XL) 5 MG 24 hr tablet Take 1 tablet (5 mg total) by mouth daily.   Polyethyl Glycol-Propyl Glycol (SYSTANE OP) Place 1 drop into both eyes daily as needed (irritation).   spironolactone  (ALDACTONE ) 25 MG tablet Take 1 tablet (25 mg total) by mouth daily.   tamsulosin  (FLOMAX ) 0.4 MG CAPS capsule Take 1 capsule (0.4 mg total) by mouth daily.   [DISCONTINUED] furosemide  (LASIX ) 20 MG tablet Take 1 tablet (20 mg total) by mouth daily as needed for edema.     Allergies:   Penicillins   Social History   Socioeconomic History   Marital status: Married    Spouse name: Not on file   Number of children: 3   Years of education: Not on file   Highest education level: Not on file  Occupational History   Occupation: administrator, civil service   Occupation: Retired-Construction  Tobacco Use   Smoking status: Never   Smokeless tobacco: Never   Tobacco comments:    He chews on a cigar  Vaping Use   Vaping status: Never Used  Substance and Sexual Activity   Alcohol use: No   Drug use: No   Sexual activity: Not Currently  Other Topics Concern   Not on file  Social History Narrative   Superintendent for construction site   Lives with wife   2 step children   2 sons   Social Drivers of Corporate Investment Banker Strain: Low Risk  (07/29/2023)   Overall Financial Resource Strain (CARDIA)    Difficulty of Paying Living Expenses: Not hard at all  Food Insecurity: No Food Insecurity (07/29/2023)   Hunger Vital Sign    Worried About Running Out of Food in the Last Year: Never true    Ran Out of Food in the Last Year: Never true  Transportation Needs: No Transportation Needs (07/29/2023)   PRAPARE - Doctor, General Practice (Medical): No    Lack of Transportation (Non-Medical): No  Physical Activity: Sufficiently Active (07/29/2023)   Exercise Vital Sign    Days of Exercise per Week: 7 days    Minutes of Exercise per Session: 40 min  Stress: No Stress Concern Present (07/29/2023)   [employer] of Occupational Health - Occupational Stress Questionnaire    Feeling of Stress : Not at  all  Social Connections: Moderately Isolated (07/29/2023)   Social Connection and Isolation Panel [NHANES]    Frequency of Communication with Friends and Family: More than three times a week    Frequency of Social Gatherings with Friends and Family: More than three times a week    Attends Religious Services: Never    Database Administrator or Organizations: No    Attends Engineer, Structural: Never    Marital Status: Married     Family History: The patient's family history includes Brain cancer in his father; Breast cancer in his mother and sister; Heart attack in his sister; Lung cancer in his father.  ROS:   Please see the history of present illness.    All other systems reviewed and are negative.  EKGs/Labs/Other Studies Reviewed:       EKG:  EKG is NOT ordered today.    Recent Labs: 11/23/2023: ALT 17; BUN 19; Creatinine, Ser 0.85; Hemoglobin 12.6; Platelets 253.0; Potassium 4.4; Sodium 139; TSH 3.02  Recent Lipid Panel    Component Value Date/Time   CHOL 136 12/11/2022 1106   TRIG 120 12/11/2022 1106   HDL 41 12/11/2022 1106   CHOLHDL 3.3 12/11/2022 1106   CHOLHDL 4 07/14/2021 1113   VLDL 36.2 07/14/2021 1113   LDLCALC 73 12/11/2022 1106     Risk Assessment/Calculations:       Physical Exam:    VS:  BP 134/84   Pulse 90   Ht 5' 11 (1.803 m)   Wt 258 lb 6.4 oz (117.2 kg)   SpO2 96%   BMI 36.04 kg/m     Wt Readings from Last 3 Encounters:  11/26/23 258 lb 6.4 oz (117.2 kg)  11/23/23 260 lb (117.9 kg)  11/16/23 263 lb 9.6 oz (119.6 kg)     GEN:  Well nourished,  well developed in no acute distress HEENT: Normal NECK: No JVD CARDIAC: RRR, no murmurs, rubs, gallops RESPIRATORY:  Clear to auscultation without rales, wheezing or rhonchi  ABDOMEN: Soft, non-tender, non-distended MUSCULOSKELETAL:  No edema; No deformity  SKIN: Warm and dry NEUROLOGIC:  Alert and oriented x 3 PSYCHIATRIC:  Normal affect   ASSESSMENT:    1. Leg edema   2. Primary hypertension   3. S/P TAVR (transcatheter aortic valve replacement)     PLAN:    In order of problems listed above:  New onset LE edema: this has responded well to Lasix . Doubt heart failure as he has no other symptoms. His cardiac/lung exam are normal. I have asked him to continue on Lasix  20mg  daily (not PRN). Labs checked by PCP 1/7. Will ask her to repeat a BMET next visit on 1/21   HTN: BP 134/84 today. Continue on losartan  50mg  BID and spiro 25mg  daily. Norvasc  discontinued 2/2 LE edema. Continued on lasix  20mg  daily as above.    HLD: continue Lipitor 20mg  BID   S/p TAVR: echo in 05/2023 with normal LV/TAVR function. No new murmurs noted today. Continue aspirin  81 mg daily and SBE prophylaxis when indicated. Annual echo due 05/2024    Medication Adjustments/Labs and Tests Ordered: Current medicines are reviewed at length with the patient today.  Concerns regarding medicines are outlined above.  No orders of the defined types were placed in this encounter.  No orders of the defined types were placed in this encounter.   Patient Instructions  Medication Instructions:  Start taking Lasix  every day   *If you need a refill on your cardiac medications before  your next appointment, please call your pharmacy*   Lab Work: None ordered   If you have labs (blood work) drawn today and your tests are completely normal, you will receive your results only by: MyChart Message (if you have MyChart) OR A paper copy in the mail If you have any lab test that is abnormal or we need to change your  treatment, we will call you to review the results.   Testing/Procedures: None ordered    Other Instructions          Signed, Lamarr Hummer, PA-C  11/28/2023 6:56 AM    [facility]

## 2023-11-29 ENCOUNTER — Other Ambulatory Visit: Payer: Self-pay | Admitting: Physician Assistant

## 2023-11-29 ENCOUNTER — Other Ambulatory Visit (HOSPITAL_COMMUNITY): Payer: Self-pay

## 2023-11-29 LAB — URINE CULTURE
MICRO NUMBER:: 15943322
SPECIMEN QUALITY:: ADEQUATE

## 2023-11-29 MED ORDER — CIPROFLOXACIN HCL 250 MG PO TABS
250.0000 mg | ORAL_TABLET | Freq: Two times a day (BID) | ORAL | 0 refills | Status: AC
  Filled 2023-11-29: qty 10, 5d supply, fill #0

## 2023-11-30 ENCOUNTER — Other Ambulatory Visit (HOSPITAL_COMMUNITY): Payer: Self-pay

## 2023-11-30 ENCOUNTER — Other Ambulatory Visit: Payer: Self-pay | Admitting: Cardiovascular Disease

## 2023-12-01 ENCOUNTER — Other Ambulatory Visit (HOSPITAL_COMMUNITY): Payer: Self-pay

## 2023-12-01 DIAGNOSIS — M5416 Radiculopathy, lumbar region: Secondary | ICD-10-CM | POA: Diagnosis not present

## 2023-12-01 MED ORDER — SPIRONOLACTONE 25 MG PO TABS
25.0000 mg | ORAL_TABLET | Freq: Every day | ORAL | 3 refills | Status: DC
  Filled 2023-12-01: qty 90, 90d supply, fill #0
  Filled 2024-02-28: qty 90, 90d supply, fill #1
  Filled 2024-05-26: qty 90, 90d supply, fill #2
  Filled 2024-08-26: qty 90, 90d supply, fill #3

## 2023-12-07 ENCOUNTER — Ambulatory Visit: Payer: HMO | Admitting: Physician Assistant

## 2023-12-09 ENCOUNTER — Other Ambulatory Visit (HOSPITAL_COMMUNITY): Payer: Self-pay

## 2023-12-10 ENCOUNTER — Other Ambulatory Visit (HOSPITAL_COMMUNITY): Payer: Self-pay

## 2023-12-13 ENCOUNTER — Other Ambulatory Visit (HOSPITAL_COMMUNITY): Payer: Self-pay

## 2023-12-13 ENCOUNTER — Ambulatory Visit (INDEPENDENT_AMBULATORY_CARE_PROVIDER_SITE_OTHER): Payer: HMO | Admitting: Physician Assistant

## 2023-12-13 ENCOUNTER — Encounter: Payer: Self-pay | Admitting: Physician Assistant

## 2023-12-13 VITALS — BP 122/80 | HR 68 | Temp 97.9°F | Ht 71.0 in | Wt 255.2 lb

## 2023-12-13 DIAGNOSIS — R600 Localized edema: Secondary | ICD-10-CM

## 2023-12-13 DIAGNOSIS — R6883 Chills (without fever): Secondary | ICD-10-CM

## 2023-12-13 DIAGNOSIS — I10 Essential (primary) hypertension: Secondary | ICD-10-CM

## 2023-12-13 LAB — BASIC METABOLIC PANEL
BUN: 18 mg/dL (ref 6–23)
CO2: 26 meq/L (ref 19–32)
Calcium: 9.6 mg/dL (ref 8.4–10.5)
Chloride: 106 meq/L (ref 96–112)
Creatinine, Ser: 0.92 mg/dL (ref 0.40–1.50)
GFR: 78.13 mL/min (ref >60.00)
Glucose, Bld: 133 mg/dL — ABNORMAL HIGH (ref 70–99)
Potassium: 4.4 meq/L (ref 3.5–5.1)
Sodium: 140 meq/L (ref 135–145)

## 2023-12-13 LAB — CBC
HCT: 39.8 % (ref 39.0–52.0)
Hemoglobin: 13.1 g/dL (ref 13.0–17.0)
MCHC: 32.9 g/dL (ref 30.0–36.0)
MCV: 87.4 fL (ref 78.0–100.0)
Platelets: 256 10*3/uL (ref 150.0–400.0)
RBC: 4.55 Mil/uL (ref 4.22–5.81)
RDW: 14.3 % (ref 11.5–15.5)
WBC: 7.9 10*3/uL (ref 4.0–10.5)

## 2023-12-13 LAB — URINALYSIS, ROUTINE W REFLEX MICROSCOPIC
Bilirubin Urine: NEGATIVE
Hgb urine dipstick: NEGATIVE
Ketones, ur: NEGATIVE
Leukocytes,Ua: NEGATIVE
Nitrite: NEGATIVE
RBC / HPF: NONE SEEN (ref 0–?)
Specific Gravity, Urine: 1.025 (ref 1.000–1.030)
Total Protein, Urine: NEGATIVE
Urine Glucose: NEGATIVE
Urobilinogen, UA: 0.2 (ref 0.0–1.0)
pH: 5.5 (ref 5.0–8.0)

## 2023-12-13 MED ORDER — CIPROFLOXACIN HCL 250 MG PO TABS
250.0000 mg | ORAL_TABLET | Freq: Two times a day (BID) | ORAL | 0 refills | Status: AC
  Filled 2023-12-13: qty 10, 5d supply, fill #0

## 2023-12-13 NOTE — Patient Instructions (Signed)
It was great to see you!  Your legs and blood pressure look great  Go ahead and start the ciprofloxacin antibiotic(s)  I will be in touch with all results and plan -- keep me posted on your symptom(s)   Take care,  Jarold Motto PA-C

## 2023-12-13 NOTE — Progress Notes (Signed)
Mitchell Humphrey is a 83 y.o. male here for a follow up of a pre-existing problem.  History of Present Illness:   Chief Complaint  Patient presents with   Hypertension   HTN // lower extremity swelling He reports compliance and good tolerance of losartan 50 mg twice daily and spironolactone 25 mg daily.  His leg swelling has improved since starting Furosemide 20 mg daily.  He saw cardiology and has been taking this daily as prescribed. His weight has been reducing due to use of lasix.  Chills He does however report experiencing chills and diarrhea for the past 2-3 days. He's currently being treated for a UTI infection. He denies any associated groin pain or pain when urinating.  He also denies checking his temperature but states he would wake up with a sweaty head.  He denies any coughs, blood in stool, abdominal pain, or poor appetite. Patient denies chest pain, SOB, blurred vision, dizziness, unusual headaches, lower leg swelling. Patient is not on medication. Denies excessive caffeine intake, stimulant usage, excessive alcohol intake, or increase in salt consumption.    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 Clifton James and Dr. Laneta Simmers   Severe aortic stenosis      Social History   Tobacco Use   Smoking status: Never   Smokeless tobacco: Never   Tobacco comments:    He chews on a cigar  Vaping Use   Vaping status: Never Used  Substance Use Topics   Alcohol use: No   Drug use: No    Past Surgical  History:  Procedure Laterality Date   APPENDECTOMY     COLONOSCOPY  08/2008   Dr. Ewing Schlein   HERNIA REPAIR  1970   [facility]   INTRAOPERATIVE TRANSTHORACIC ECHOCARDIOGRAM N/A 05/26/2022   Procedure: INTRAOPERATIVE TRANSTHORACIC ECHOCARDIOGRAM;  Surgeon: Kathleene Hazel, MD;  Location: [facility];  Service: Open Heart Surgery;  Laterality: N/A;   RIGHT HEART CATH AND CORONARY ANGIOGRAPHY N/A 03/11/2022   Procedure: RIGHT HEART CATH AND CORONARY ANGIOGRAPHY;  Surgeon: Kathleene Hazel, MD;  Location: [facility];  Service: Cardiovascular;  Laterality: N/A;   TRANSCATHETER AORTIC VALVE REPLACEMENT, TRANSFEMORAL N/A 05/26/2022   Procedure: Transcatheter Aortic Valve Replacement, Transfemoral;  Surgeon: Kathleene Hazel, MD;  Location: [facility];  Service: Open Heart Surgery;  Laterality: N/A;    Family History  Problem Relation Age of Onset   Breast cancer Mother    Lung cancer Father    Brain cancer Father    Breast cancer Sister    Heart attack Sister     Allergies  Allergen Reactions   Penicillins Rash    Current Medications:   Current Outpatient Medications:    acetaminophen (TYLENOL) 500 MG tablet, Take 1,000 mg by mouth every 6 (six) hours as needed for moderate pain., Disp: , Rfl:    aspirin EC 81 MG tablet, Take 81 mg by mouth  daily. Swallow whole., Disp: , Rfl:    atorvastatin (LIPITOR) 20 MG tablet, Take 1 tablet (20 mg total) by mouth in the morning and at bedtime., Disp: 180 tablet, Rfl: 3   cetirizine (ZYRTEC) 10 MG tablet, Take 10 mg by mouth daily., Disp: , Rfl:    Cholecalciferol (VITAMIN D3) 125 MCG (5000 UT) CAPS, Take 5,000 Units by mouth daily., Disp: , Rfl:    ciprofloxacin (CIPRO) 250 MG tablet, Take 1 tablet (250 mg total) by mouth 2 (two) times daily for 5 days., Disp: 10 tablet, Rfl: 0   Cyanocobalamin (VITAMIN B 12 PO), Take 1 capsule by mouth daily in the afternoon., Disp: , Rfl:    diclofenac Sodium (VOLTAREN) 1 %  GEL, Apply 2 g topically daily as needed (pain)., Disp: , Rfl:    ELDERBERRY PO, Take 1 capsule by mouth daily., Disp: , Rfl:    finasteride (PROSCAR) 5 MG tablet, Take 1 tablet (5 mg total) by mouth daily., Disp: 90 tablet, Rfl: 1   furosemide (LASIX) 20 MG tablet, Take 20 mg by mouth daily., Disp: , Rfl:    ipratropium (ATROVENT) 0.06 % nasal spray, Place 2 sprays into both nostrils 4 (four) times daily., Disp: 15 mL, Rfl: 12   ketorolac (ACULAR) 0.5 % ophthalmic solution, , Disp: , Rfl:    levothyroxine (SYNTHROID) 125 MCG tablet, Take 1 tablet (125 mcg total) by mouth daily., Disp: 30 tablet, Rfl: 5   losartan (COZAAR) 50 MG tablet, Take 1 tablet (50 mg total) by mouth 2 (two) times daily., Disp: 180 tablet, Rfl: 3   meloxicam (MOBIC) 15 MG tablet, TAKE 1 TABLET (15 MG TOTAL) BY MOUTH DAILY., Disp: 90 tablet, Rfl: 1   Multiple Vitamins-Minerals (IMMUNE SUPPORT PO), Take 1 tablet by mouth daily. NHR Immune Protect, Disp: , Rfl:    oxybutynin (DITROPAN-XL) 5 MG 24 hr tablet, Take 1 tablet (5 mg total) by mouth daily., Disp: 30 tablet, Rfl: 11   Polyethyl Glycol-Propyl Glycol (SYSTANE OP), Place 1 drop into both eyes daily as needed (irritation)., Disp: , Rfl:    spironolactone (ALDACTONE) 25 MG tablet, Take 1 tablet (25 mg total) by mouth daily., Disp: 90 tablet, Rfl: 3   tamsulosin (FLOMAX) 0.4 MG CAPS capsule, Take 1 capsule (0.4 mg total) by mouth daily., Disp: 90 capsule, Rfl: 1   Review of Systems:   Review of Systems  Constitutional:  Positive for chills and fever.  Respiratory:  Negative for cough and shortness of breath.   Cardiovascular:  Negative for leg swelling.  Gastrointestinal:  Positive for diarrhea. Negative for abdominal pain and blood in stool.  Genitourinary:  Negative for dysuria.    Vitals:   Vitals:   12/13/23 0800  BP: 122/80  Pulse: 68  Temp: 97.9 F (36.6 C)  TempSrc: Temporal  SpO2: 97%  Weight: 255 lb 4 oz (115.8 kg)  Height: 5\' 11"  (1.803 m)      Body mass index is 35.6 kg/m.  Physical Exam:   Physical Exam Vitals and nursing note reviewed.  Constitutional:      General: He is not in acute distress.    Appearance: He is well-developed. He is not ill-appearing or toxic-appearing.  Cardiovascular:     Rate and Rhythm: Normal rate and regular rhythm.     Pulses: Normal pulses.     Heart sounds: Normal heart sounds, S1 normal and S2 normal.  Pulmonary:     Effort: Pulmonary effort is normal.     Breath sounds:  Normal breath sounds.  Skin:    General: Skin is warm and dry.  Neurological:     Mental Status: He is alert.     GCS: GCS eye subscore is 4. GCS verbal subscore is 5. GCS motor subscore is 6.  Psychiatric:        Speech: Speech normal.        Behavior: Behavior normal. Behavior is cooperative.     Assessment and Plan:   Chills Unclear etiology Will check CBC and urinalysis  Did have recent urinary tract infection (UTI) with minimal symptom(s) -- I will go ahead and empirically treat with ciprofloxacin while we are awaiting urinalysis results Will adjust treatment based on results If another urinary tract infection (UTI), may consider urology referral  Benign essential hypertension Normotensive Continues losartan 50 mg twice daily, lasix 20 mg daily and spironolactone 25 mg daily.  Continue regular monitoring of symptom(s)   Leg edema Improved Continue to monitor and continue lasix 20 mg dialy  Jarold Motto, PA-C  I,Safa M Kadhim,acting as a scribe for Jarold Motto, PA.,have documented all relevant documentation on the behalf of Jarold Motto, PA,as directed by  Jarold Motto, PA while in the presence of Jarold Motto, [place].   I, Jarold Motto, [place], have reviewed all documentation for this visit. The documentation on 12/13/23 for the exam, diagnosis, procedures, and orders are all accurate and complete.

## 2023-12-15 ENCOUNTER — Other Ambulatory Visit (HOSPITAL_COMMUNITY): Payer: Self-pay

## 2023-12-15 LAB — URINE CULTURE
MICRO NUMBER:: 16005488
Result:: NO GROWTH
SPECIMEN QUALITY:: ADEQUATE

## 2023-12-21 ENCOUNTER — Other Ambulatory Visit (HOSPITAL_COMMUNITY): Payer: Self-pay

## 2023-12-21 DIAGNOSIS — M5416 Radiculopathy, lumbar region: Secondary | ICD-10-CM | POA: Diagnosis not present

## 2023-12-21 MED ORDER — GABAPENTIN 300 MG PO CAPS
300.0000 mg | ORAL_CAPSULE | Freq: Two times a day (BID) | ORAL | 2 refills | Status: DC | PRN
  Filled 2023-12-21: qty 60, 30d supply, fill #0
  Filled 2024-01-17: qty 60, 30d supply, fill #1
  Filled 2024-02-03: qty 60, 30d supply, fill #2

## 2024-01-03 DIAGNOSIS — M545 Low back pain, unspecified: Secondary | ICD-10-CM | POA: Diagnosis not present

## 2024-01-03 DIAGNOSIS — M5417 Radiculopathy, lumbosacral region: Secondary | ICD-10-CM | POA: Diagnosis not present

## 2024-01-04 ENCOUNTER — Other Ambulatory Visit: Payer: Self-pay

## 2024-01-04 ENCOUNTER — Other Ambulatory Visit (HOSPITAL_COMMUNITY): Payer: Self-pay

## 2024-01-07 DIAGNOSIS — M545 Low back pain, unspecified: Secondary | ICD-10-CM | POA: Diagnosis not present

## 2024-01-07 DIAGNOSIS — M5417 Radiculopathy, lumbosacral region: Secondary | ICD-10-CM | POA: Diagnosis not present

## 2024-01-10 DIAGNOSIS — M5417 Radiculopathy, lumbosacral region: Secondary | ICD-10-CM | POA: Diagnosis not present

## 2024-01-10 DIAGNOSIS — M545 Low back pain, unspecified: Secondary | ICD-10-CM | POA: Diagnosis not present

## 2024-01-12 DIAGNOSIS — M5417 Radiculopathy, lumbosacral region: Secondary | ICD-10-CM | POA: Diagnosis not present

## 2024-01-12 DIAGNOSIS — M545 Low back pain, unspecified: Secondary | ICD-10-CM | POA: Diagnosis not present

## 2024-01-14 DIAGNOSIS — M5417 Radiculopathy, lumbosacral region: Secondary | ICD-10-CM | POA: Diagnosis not present

## 2024-01-14 DIAGNOSIS — M545 Low back pain, unspecified: Secondary | ICD-10-CM | POA: Diagnosis not present

## 2024-01-17 ENCOUNTER — Other Ambulatory Visit: Payer: Self-pay | Admitting: Physician Assistant

## 2024-01-17 ENCOUNTER — Other Ambulatory Visit (HOSPITAL_COMMUNITY): Payer: Self-pay

## 2024-01-17 DIAGNOSIS — M545 Low back pain, unspecified: Secondary | ICD-10-CM | POA: Diagnosis not present

## 2024-01-17 DIAGNOSIS — M5417 Radiculopathy, lumbosacral region: Secondary | ICD-10-CM | POA: Diagnosis not present

## 2024-01-17 MED ORDER — FUROSEMIDE 20 MG PO TABS
20.0000 mg | ORAL_TABLET | Freq: Every day | ORAL | 2 refills | Status: DC
  Filled 2024-01-17: qty 30, 30d supply, fill #0
  Filled 2024-02-16: qty 30, 30d supply, fill #1
  Filled 2024-03-16: qty 30, 30d supply, fill #2

## 2024-01-19 DIAGNOSIS — M545 Low back pain, unspecified: Secondary | ICD-10-CM | POA: Diagnosis not present

## 2024-01-19 DIAGNOSIS — M5417 Radiculopathy, lumbosacral region: Secondary | ICD-10-CM | POA: Diagnosis not present

## 2024-01-21 ENCOUNTER — Other Ambulatory Visit (HOSPITAL_COMMUNITY): Payer: Self-pay

## 2024-01-21 DIAGNOSIS — M5416 Radiculopathy, lumbar region: Secondary | ICD-10-CM | POA: Diagnosis not present

## 2024-01-21 MED ORDER — GABAPENTIN 300 MG PO CAPS
300.0000 mg | ORAL_CAPSULE | Freq: Three times a day (TID) | ORAL | 2 refills | Status: DC | PRN
  Filled 2024-01-21 – 2024-02-09 (×4): qty 90, 30d supply, fill #0
  Filled 2024-03-16: qty 90, 30d supply, fill #1
  Filled 2024-04-27: qty 90, 30d supply, fill #2

## 2024-01-24 DIAGNOSIS — M5417 Radiculopathy, lumbosacral region: Secondary | ICD-10-CM | POA: Diagnosis not present

## 2024-01-24 DIAGNOSIS — M545 Low back pain, unspecified: Secondary | ICD-10-CM | POA: Diagnosis not present

## 2024-01-26 DIAGNOSIS — M545 Low back pain, unspecified: Secondary | ICD-10-CM | POA: Diagnosis not present

## 2024-01-26 DIAGNOSIS — M5417 Radiculopathy, lumbosacral region: Secondary | ICD-10-CM | POA: Diagnosis not present

## 2024-01-28 DIAGNOSIS — M545 Low back pain, unspecified: Secondary | ICD-10-CM | POA: Diagnosis not present

## 2024-01-28 DIAGNOSIS — M5417 Radiculopathy, lumbosacral region: Secondary | ICD-10-CM | POA: Diagnosis not present

## 2024-01-31 DIAGNOSIS — M545 Low back pain, unspecified: Secondary | ICD-10-CM | POA: Diagnosis not present

## 2024-01-31 DIAGNOSIS — M5417 Radiculopathy, lumbosacral region: Secondary | ICD-10-CM | POA: Diagnosis not present

## 2024-02-02 DIAGNOSIS — M5417 Radiculopathy, lumbosacral region: Secondary | ICD-10-CM | POA: Diagnosis not present

## 2024-02-02 DIAGNOSIS — M545 Low back pain, unspecified: Secondary | ICD-10-CM | POA: Diagnosis not present

## 2024-02-03 ENCOUNTER — Other Ambulatory Visit (HOSPITAL_COMMUNITY): Payer: Self-pay

## 2024-02-04 DIAGNOSIS — M5417 Radiculopathy, lumbosacral region: Secondary | ICD-10-CM | POA: Diagnosis not present

## 2024-02-04 DIAGNOSIS — M545 Low back pain, unspecified: Secondary | ICD-10-CM | POA: Diagnosis not present

## 2024-02-07 ENCOUNTER — Other Ambulatory Visit (HOSPITAL_COMMUNITY): Payer: Self-pay

## 2024-02-07 DIAGNOSIS — M5417 Radiculopathy, lumbosacral region: Secondary | ICD-10-CM | POA: Diagnosis not present

## 2024-02-07 DIAGNOSIS — M545 Low back pain, unspecified: Secondary | ICD-10-CM | POA: Diagnosis not present

## 2024-02-09 ENCOUNTER — Other Ambulatory Visit (HOSPITAL_COMMUNITY): Payer: Self-pay

## 2024-02-09 DIAGNOSIS — M545 Low back pain, unspecified: Secondary | ICD-10-CM | POA: Diagnosis not present

## 2024-02-09 DIAGNOSIS — M5417 Radiculopathy, lumbosacral region: Secondary | ICD-10-CM | POA: Diagnosis not present

## 2024-02-11 DIAGNOSIS — M545 Low back pain, unspecified: Secondary | ICD-10-CM | POA: Diagnosis not present

## 2024-02-11 DIAGNOSIS — M5417 Radiculopathy, lumbosacral region: Secondary | ICD-10-CM | POA: Diagnosis not present

## 2024-02-14 DIAGNOSIS — M5417 Radiculopathy, lumbosacral region: Secondary | ICD-10-CM | POA: Diagnosis not present

## 2024-02-14 DIAGNOSIS — M545 Low back pain, unspecified: Secondary | ICD-10-CM | POA: Diagnosis not present

## 2024-02-16 ENCOUNTER — Other Ambulatory Visit (HOSPITAL_COMMUNITY): Payer: Self-pay

## 2024-02-16 DIAGNOSIS — M5417 Radiculopathy, lumbosacral region: Secondary | ICD-10-CM | POA: Diagnosis not present

## 2024-02-16 DIAGNOSIS — M545 Low back pain, unspecified: Secondary | ICD-10-CM | POA: Diagnosis not present

## 2024-02-18 DIAGNOSIS — M5417 Radiculopathy, lumbosacral region: Secondary | ICD-10-CM | POA: Diagnosis not present

## 2024-02-18 DIAGNOSIS — M545 Low back pain, unspecified: Secondary | ICD-10-CM | POA: Diagnosis not present

## 2024-02-21 DIAGNOSIS — M5417 Radiculopathy, lumbosacral region: Secondary | ICD-10-CM | POA: Diagnosis not present

## 2024-02-21 DIAGNOSIS — M545 Low back pain, unspecified: Secondary | ICD-10-CM | POA: Diagnosis not present

## 2024-02-23 DIAGNOSIS — M545 Low back pain, unspecified: Secondary | ICD-10-CM | POA: Diagnosis not present

## 2024-02-23 DIAGNOSIS — M5417 Radiculopathy, lumbosacral region: Secondary | ICD-10-CM | POA: Diagnosis not present

## 2024-02-25 DIAGNOSIS — M545 Low back pain, unspecified: Secondary | ICD-10-CM | POA: Diagnosis not present

## 2024-02-25 DIAGNOSIS — M5417 Radiculopathy, lumbosacral region: Secondary | ICD-10-CM | POA: Diagnosis not present

## 2024-02-28 ENCOUNTER — Other Ambulatory Visit (HOSPITAL_COMMUNITY): Payer: Self-pay

## 2024-02-28 DIAGNOSIS — M545 Low back pain, unspecified: Secondary | ICD-10-CM | POA: Diagnosis not present

## 2024-02-28 DIAGNOSIS — M5417 Radiculopathy, lumbosacral region: Secondary | ICD-10-CM | POA: Diagnosis not present

## 2024-03-01 DIAGNOSIS — M545 Low back pain, unspecified: Secondary | ICD-10-CM | POA: Diagnosis not present

## 2024-03-01 DIAGNOSIS — M5417 Radiculopathy, lumbosacral region: Secondary | ICD-10-CM | POA: Diagnosis not present

## 2024-03-03 DIAGNOSIS — M545 Low back pain, unspecified: Secondary | ICD-10-CM | POA: Diagnosis not present

## 2024-03-03 DIAGNOSIS — M5417 Radiculopathy, lumbosacral region: Secondary | ICD-10-CM | POA: Diagnosis not present

## 2024-03-06 DIAGNOSIS — M545 Low back pain, unspecified: Secondary | ICD-10-CM | POA: Diagnosis not present

## 2024-03-06 DIAGNOSIS — M5417 Radiculopathy, lumbosacral region: Secondary | ICD-10-CM | POA: Diagnosis not present

## 2024-03-08 ENCOUNTER — Other Ambulatory Visit: Payer: Self-pay | Admitting: Physician Assistant

## 2024-03-08 ENCOUNTER — Other Ambulatory Visit (HOSPITAL_COMMUNITY): Payer: Self-pay

## 2024-03-08 ENCOUNTER — Other Ambulatory Visit: Payer: Self-pay | Admitting: Cardiovascular Disease

## 2024-03-08 DIAGNOSIS — M5417 Radiculopathy, lumbosacral region: Secondary | ICD-10-CM | POA: Diagnosis not present

## 2024-03-08 DIAGNOSIS — M545 Low back pain, unspecified: Secondary | ICD-10-CM | POA: Diagnosis not present

## 2024-03-08 MED ORDER — LOSARTAN POTASSIUM 50 MG PO TABS
50.0000 mg | ORAL_TABLET | Freq: Two times a day (BID) | ORAL | 1 refills | Status: DC
  Filled 2024-03-08: qty 180, 90d supply, fill #0
  Filled 2024-06-08: qty 180, 90d supply, fill #1

## 2024-03-08 MED ORDER — LEVOTHYROXINE SODIUM 125 MCG PO TABS
125.0000 ug | ORAL_TABLET | Freq: Every day | ORAL | 5 refills | Status: DC
  Filled 2024-03-08: qty 30, 30d supply, fill #0
  Filled 2024-04-11: qty 30, 30d supply, fill #1
  Filled 2024-05-17: qty 30, 30d supply, fill #2
  Filled 2024-06-16: qty 22, 22d supply, fill #3
  Filled 2024-06-16 (×2): qty 8, 8d supply, fill #3
  Filled 2024-07-07 – 2024-07-14 (×2): qty 30, 30d supply, fill #4
  Filled 2024-08-14: qty 30, 30d supply, fill #5

## 2024-03-10 DIAGNOSIS — M5417 Radiculopathy, lumbosacral region: Secondary | ICD-10-CM | POA: Diagnosis not present

## 2024-03-10 DIAGNOSIS — M545 Low back pain, unspecified: Secondary | ICD-10-CM | POA: Diagnosis not present

## 2024-03-13 DIAGNOSIS — M5417 Radiculopathy, lumbosacral region: Secondary | ICD-10-CM | POA: Diagnosis not present

## 2024-03-13 DIAGNOSIS — M545 Low back pain, unspecified: Secondary | ICD-10-CM | POA: Diagnosis not present

## 2024-03-15 DIAGNOSIS — M545 Low back pain, unspecified: Secondary | ICD-10-CM | POA: Diagnosis not present

## 2024-03-15 DIAGNOSIS — M5417 Radiculopathy, lumbosacral region: Secondary | ICD-10-CM | POA: Diagnosis not present

## 2024-03-16 ENCOUNTER — Other Ambulatory Visit: Payer: Self-pay | Admitting: Physician Assistant

## 2024-03-16 ENCOUNTER — Other Ambulatory Visit (HOSPITAL_COMMUNITY): Payer: Self-pay

## 2024-03-17 ENCOUNTER — Other Ambulatory Visit (HOSPITAL_COMMUNITY): Payer: Self-pay

## 2024-03-17 MED ORDER — MELOXICAM 15 MG PO TABS
15.0000 mg | ORAL_TABLET | Freq: Every day | ORAL | 1 refills | Status: DC
  Filled 2024-03-17: qty 90, 90d supply, fill #0
  Filled 2024-06-16: qty 90, 90d supply, fill #1

## 2024-03-20 DIAGNOSIS — M545 Low back pain, unspecified: Secondary | ICD-10-CM | POA: Diagnosis not present

## 2024-03-20 DIAGNOSIS — M5417 Radiculopathy, lumbosacral region: Secondary | ICD-10-CM | POA: Diagnosis not present

## 2024-03-22 DIAGNOSIS — M5417 Radiculopathy, lumbosacral region: Secondary | ICD-10-CM | POA: Diagnosis not present

## 2024-03-22 DIAGNOSIS — M545 Low back pain, unspecified: Secondary | ICD-10-CM | POA: Diagnosis not present

## 2024-03-24 DIAGNOSIS — M5417 Radiculopathy, lumbosacral region: Secondary | ICD-10-CM | POA: Diagnosis not present

## 2024-03-24 DIAGNOSIS — M545 Low back pain, unspecified: Secondary | ICD-10-CM | POA: Diagnosis not present

## 2024-03-27 DIAGNOSIS — M5417 Radiculopathy, lumbosacral region: Secondary | ICD-10-CM | POA: Diagnosis not present

## 2024-03-27 DIAGNOSIS — M545 Low back pain, unspecified: Secondary | ICD-10-CM | POA: Diagnosis not present

## 2024-03-29 DIAGNOSIS — M5417 Radiculopathy, lumbosacral region: Secondary | ICD-10-CM | POA: Diagnosis not present

## 2024-03-29 DIAGNOSIS — M545 Low back pain, unspecified: Secondary | ICD-10-CM | POA: Diagnosis not present

## 2024-03-31 DIAGNOSIS — M5417 Radiculopathy, lumbosacral region: Secondary | ICD-10-CM | POA: Diagnosis not present

## 2024-03-31 DIAGNOSIS — M545 Low back pain, unspecified: Secondary | ICD-10-CM | POA: Diagnosis not present

## 2024-04-03 DIAGNOSIS — M5417 Radiculopathy, lumbosacral region: Secondary | ICD-10-CM | POA: Diagnosis not present

## 2024-04-03 DIAGNOSIS — M545 Low back pain, unspecified: Secondary | ICD-10-CM | POA: Diagnosis not present

## 2024-04-05 DIAGNOSIS — M545 Low back pain, unspecified: Secondary | ICD-10-CM | POA: Diagnosis not present

## 2024-04-05 DIAGNOSIS — M5417 Radiculopathy, lumbosacral region: Secondary | ICD-10-CM | POA: Diagnosis not present

## 2024-04-07 DIAGNOSIS — M545 Low back pain, unspecified: Secondary | ICD-10-CM | POA: Diagnosis not present

## 2024-04-07 DIAGNOSIS — M5417 Radiculopathy, lumbosacral region: Secondary | ICD-10-CM | POA: Diagnosis not present

## 2024-04-11 ENCOUNTER — Other Ambulatory Visit: Payer: Self-pay

## 2024-04-11 ENCOUNTER — Other Ambulatory Visit: Payer: Self-pay | Admitting: Physician Assistant

## 2024-04-11 ENCOUNTER — Other Ambulatory Visit (HOSPITAL_COMMUNITY): Payer: Self-pay

## 2024-04-11 MED ORDER — TAMSULOSIN HCL 0.4 MG PO CAPS
0.4000 mg | ORAL_CAPSULE | Freq: Every day | ORAL | 1 refills | Status: DC
  Filled 2024-04-11: qty 90, 90d supply, fill #0
  Filled 2024-07-07: qty 90, 90d supply, fill #1

## 2024-04-11 MED ORDER — FINASTERIDE 5 MG PO TABS
5.0000 mg | ORAL_TABLET | Freq: Every day | ORAL | 1 refills | Status: DC
  Filled 2024-04-11: qty 90, 90d supply, fill #0
  Filled 2024-07-07: qty 90, 90d supply, fill #1

## 2024-04-12 DIAGNOSIS — M5417 Radiculopathy, lumbosacral region: Secondary | ICD-10-CM | POA: Diagnosis not present

## 2024-04-12 DIAGNOSIS — M545 Low back pain, unspecified: Secondary | ICD-10-CM | POA: Diagnosis not present

## 2024-04-14 DIAGNOSIS — M545 Low back pain, unspecified: Secondary | ICD-10-CM | POA: Diagnosis not present

## 2024-04-14 DIAGNOSIS — M5417 Radiculopathy, lumbosacral region: Secondary | ICD-10-CM | POA: Diagnosis not present

## 2024-04-18 ENCOUNTER — Other Ambulatory Visit: Payer: Self-pay | Admitting: Physician Assistant

## 2024-04-18 ENCOUNTER — Other Ambulatory Visit (HOSPITAL_COMMUNITY): Payer: Self-pay

## 2024-04-18 ENCOUNTER — Other Ambulatory Visit: Payer: Self-pay

## 2024-04-18 MED ORDER — FUROSEMIDE 20 MG PO TABS
20.0000 mg | ORAL_TABLET | Freq: Every day | ORAL | 2 refills | Status: DC
  Filled 2024-04-18: qty 30, 30d supply, fill #0
  Filled 2024-05-17: qty 30, 30d supply, fill #1
  Filled 2024-06-16: qty 30, 30d supply, fill #2

## 2024-04-19 DIAGNOSIS — M545 Low back pain, unspecified: Secondary | ICD-10-CM | POA: Diagnosis not present

## 2024-04-19 DIAGNOSIS — M5417 Radiculopathy, lumbosacral region: Secondary | ICD-10-CM | POA: Diagnosis not present

## 2024-04-21 DIAGNOSIS — M545 Low back pain, unspecified: Secondary | ICD-10-CM | POA: Diagnosis not present

## 2024-04-21 DIAGNOSIS — M5417 Radiculopathy, lumbosacral region: Secondary | ICD-10-CM | POA: Diagnosis not present

## 2024-04-24 DIAGNOSIS — M5416 Radiculopathy, lumbar region: Secondary | ICD-10-CM | POA: Diagnosis not present

## 2024-04-24 DIAGNOSIS — M5417 Radiculopathy, lumbosacral region: Secondary | ICD-10-CM | POA: Diagnosis not present

## 2024-04-24 DIAGNOSIS — M545 Low back pain, unspecified: Secondary | ICD-10-CM | POA: Diagnosis not present

## 2024-04-25 DIAGNOSIS — H35363 Drusen (degenerative) of macula, bilateral: Secondary | ICD-10-CM | POA: Diagnosis not present

## 2024-04-25 DIAGNOSIS — H35373 Puckering of macula, bilateral: Secondary | ICD-10-CM | POA: Diagnosis not present

## 2024-04-25 DIAGNOSIS — H26493 Other secondary cataract, bilateral: Secondary | ICD-10-CM | POA: Diagnosis not present

## 2024-04-25 DIAGNOSIS — H4312 Vitreous hemorrhage, left eye: Secondary | ICD-10-CM | POA: Diagnosis not present

## 2024-04-25 DIAGNOSIS — H43392 Other vitreous opacities, left eye: Secondary | ICD-10-CM | POA: Diagnosis not present

## 2024-04-26 DIAGNOSIS — M5417 Radiculopathy, lumbosacral region: Secondary | ICD-10-CM | POA: Diagnosis not present

## 2024-04-26 DIAGNOSIS — M545 Low back pain, unspecified: Secondary | ICD-10-CM | POA: Diagnosis not present

## 2024-04-27 ENCOUNTER — Other Ambulatory Visit (HOSPITAL_COMMUNITY): Payer: Self-pay

## 2024-04-28 DIAGNOSIS — M5417 Radiculopathy, lumbosacral region: Secondary | ICD-10-CM | POA: Diagnosis not present

## 2024-04-28 DIAGNOSIS — M545 Low back pain, unspecified: Secondary | ICD-10-CM | POA: Diagnosis not present

## 2024-05-01 DIAGNOSIS — H4322 Crystalline deposits in vitreous body, left eye: Secondary | ICD-10-CM | POA: Diagnosis not present

## 2024-05-01 DIAGNOSIS — H35033 Hypertensive retinopathy, bilateral: Secondary | ICD-10-CM | POA: Diagnosis not present

## 2024-05-01 DIAGNOSIS — M5417 Radiculopathy, lumbosacral region: Secondary | ICD-10-CM | POA: Diagnosis not present

## 2024-05-01 DIAGNOSIS — M545 Low back pain, unspecified: Secondary | ICD-10-CM | POA: Diagnosis not present

## 2024-05-01 DIAGNOSIS — Z961 Presence of intraocular lens: Secondary | ICD-10-CM | POA: Diagnosis not present

## 2024-05-01 DIAGNOSIS — H26493 Other secondary cataract, bilateral: Secondary | ICD-10-CM | POA: Diagnosis not present

## 2024-05-01 DIAGNOSIS — H31092 Other chorioretinal scars, left eye: Secondary | ICD-10-CM | POA: Diagnosis not present

## 2024-05-03 DIAGNOSIS — M5417 Radiculopathy, lumbosacral region: Secondary | ICD-10-CM | POA: Diagnosis not present

## 2024-05-03 DIAGNOSIS — M545 Low back pain, unspecified: Secondary | ICD-10-CM | POA: Diagnosis not present

## 2024-05-05 DIAGNOSIS — M5417 Radiculopathy, lumbosacral region: Secondary | ICD-10-CM | POA: Diagnosis not present

## 2024-05-05 DIAGNOSIS — M545 Low back pain, unspecified: Secondary | ICD-10-CM | POA: Diagnosis not present

## 2024-05-08 DIAGNOSIS — M545 Low back pain, unspecified: Secondary | ICD-10-CM | POA: Diagnosis not present

## 2024-05-08 DIAGNOSIS — M5417 Radiculopathy, lumbosacral region: Secondary | ICD-10-CM | POA: Diagnosis not present

## 2024-05-10 DIAGNOSIS — M5417 Radiculopathy, lumbosacral region: Secondary | ICD-10-CM | POA: Diagnosis not present

## 2024-05-10 DIAGNOSIS — M545 Low back pain, unspecified: Secondary | ICD-10-CM | POA: Diagnosis not present

## 2024-05-12 DIAGNOSIS — M5417 Radiculopathy, lumbosacral region: Secondary | ICD-10-CM | POA: Diagnosis not present

## 2024-05-12 DIAGNOSIS — M545 Low back pain, unspecified: Secondary | ICD-10-CM | POA: Diagnosis not present

## 2024-05-15 DIAGNOSIS — M5417 Radiculopathy, lumbosacral region: Secondary | ICD-10-CM | POA: Diagnosis not present

## 2024-05-15 DIAGNOSIS — M545 Low back pain, unspecified: Secondary | ICD-10-CM | POA: Diagnosis not present

## 2024-05-17 ENCOUNTER — Other Ambulatory Visit (HOSPITAL_COMMUNITY): Payer: Self-pay

## 2024-05-17 DIAGNOSIS — M5417 Radiculopathy, lumbosacral region: Secondary | ICD-10-CM | POA: Diagnosis not present

## 2024-05-17 DIAGNOSIS — M545 Low back pain, unspecified: Secondary | ICD-10-CM | POA: Diagnosis not present

## 2024-05-22 DIAGNOSIS — M545 Low back pain, unspecified: Secondary | ICD-10-CM | POA: Diagnosis not present

## 2024-05-22 DIAGNOSIS — M5417 Radiculopathy, lumbosacral region: Secondary | ICD-10-CM | POA: Diagnosis not present

## 2024-05-24 DIAGNOSIS — M545 Low back pain, unspecified: Secondary | ICD-10-CM | POA: Diagnosis not present

## 2024-05-24 DIAGNOSIS — M5417 Radiculopathy, lumbosacral region: Secondary | ICD-10-CM | POA: Diagnosis not present

## 2024-05-26 ENCOUNTER — Other Ambulatory Visit (HOSPITAL_COMMUNITY): Payer: Self-pay

## 2024-05-26 DIAGNOSIS — M5417 Radiculopathy, lumbosacral region: Secondary | ICD-10-CM | POA: Diagnosis not present

## 2024-05-26 DIAGNOSIS — M545 Low back pain, unspecified: Secondary | ICD-10-CM | POA: Diagnosis not present

## 2024-05-29 DIAGNOSIS — M545 Low back pain, unspecified: Secondary | ICD-10-CM | POA: Diagnosis not present

## 2024-05-29 DIAGNOSIS — M5417 Radiculopathy, lumbosacral region: Secondary | ICD-10-CM | POA: Diagnosis not present

## 2024-05-31 DIAGNOSIS — M545 Low back pain, unspecified: Secondary | ICD-10-CM | POA: Diagnosis not present

## 2024-05-31 DIAGNOSIS — M5417 Radiculopathy, lumbosacral region: Secondary | ICD-10-CM | POA: Diagnosis not present

## 2024-06-02 DIAGNOSIS — M5417 Radiculopathy, lumbosacral region: Secondary | ICD-10-CM | POA: Diagnosis not present

## 2024-06-02 DIAGNOSIS — M545 Low back pain, unspecified: Secondary | ICD-10-CM | POA: Diagnosis not present

## 2024-06-05 DIAGNOSIS — M5417 Radiculopathy, lumbosacral region: Secondary | ICD-10-CM | POA: Diagnosis not present

## 2024-06-05 DIAGNOSIS — M545 Low back pain, unspecified: Secondary | ICD-10-CM | POA: Diagnosis not present

## 2024-06-07 DIAGNOSIS — M5417 Radiculopathy, lumbosacral region: Secondary | ICD-10-CM | POA: Diagnosis not present

## 2024-06-07 DIAGNOSIS — M545 Low back pain, unspecified: Secondary | ICD-10-CM | POA: Diagnosis not present

## 2024-06-08 ENCOUNTER — Other Ambulatory Visit (HOSPITAL_COMMUNITY): Payer: Self-pay

## 2024-06-09 DIAGNOSIS — M5417 Radiculopathy, lumbosacral region: Secondary | ICD-10-CM | POA: Diagnosis not present

## 2024-06-09 DIAGNOSIS — M545 Low back pain, unspecified: Secondary | ICD-10-CM | POA: Diagnosis not present

## 2024-06-12 DIAGNOSIS — M545 Low back pain, unspecified: Secondary | ICD-10-CM | POA: Diagnosis not present

## 2024-06-12 DIAGNOSIS — M5417 Radiculopathy, lumbosacral region: Secondary | ICD-10-CM | POA: Diagnosis not present

## 2024-06-14 DIAGNOSIS — M5417 Radiculopathy, lumbosacral region: Secondary | ICD-10-CM | POA: Diagnosis not present

## 2024-06-14 DIAGNOSIS — M545 Low back pain, unspecified: Secondary | ICD-10-CM | POA: Diagnosis not present

## 2024-06-16 ENCOUNTER — Other Ambulatory Visit (HOSPITAL_COMMUNITY): Payer: Self-pay

## 2024-06-16 ENCOUNTER — Other Ambulatory Visit: Payer: Self-pay

## 2024-06-16 DIAGNOSIS — M545 Low back pain, unspecified: Secondary | ICD-10-CM | POA: Diagnosis not present

## 2024-06-19 DIAGNOSIS — M545 Low back pain, unspecified: Secondary | ICD-10-CM | POA: Diagnosis not present

## 2024-06-19 DIAGNOSIS — M5417 Radiculopathy, lumbosacral region: Secondary | ICD-10-CM | POA: Diagnosis not present

## 2024-06-21 DIAGNOSIS — M5417 Radiculopathy, lumbosacral region: Secondary | ICD-10-CM | POA: Diagnosis not present

## 2024-06-21 DIAGNOSIS — M545 Low back pain, unspecified: Secondary | ICD-10-CM | POA: Diagnosis not present

## 2024-06-23 DIAGNOSIS — M5417 Radiculopathy, lumbosacral region: Secondary | ICD-10-CM | POA: Diagnosis not present

## 2024-06-23 DIAGNOSIS — M545 Low back pain, unspecified: Secondary | ICD-10-CM | POA: Diagnosis not present

## 2024-06-26 DIAGNOSIS — M545 Low back pain, unspecified: Secondary | ICD-10-CM | POA: Diagnosis not present

## 2024-06-28 DIAGNOSIS — M545 Low back pain, unspecified: Secondary | ICD-10-CM | POA: Diagnosis not present

## 2024-07-03 DIAGNOSIS — M545 Low back pain, unspecified: Secondary | ICD-10-CM | POA: Diagnosis not present

## 2024-07-05 DIAGNOSIS — M545 Low back pain, unspecified: Secondary | ICD-10-CM | POA: Diagnosis not present

## 2024-07-07 ENCOUNTER — Other Ambulatory Visit (HOSPITAL_COMMUNITY): Payer: Self-pay

## 2024-07-07 DIAGNOSIS — M545 Low back pain, unspecified: Secondary | ICD-10-CM | POA: Diagnosis not present

## 2024-07-10 DIAGNOSIS — M545 Low back pain, unspecified: Secondary | ICD-10-CM | POA: Diagnosis not present

## 2024-07-12 DIAGNOSIS — M545 Low back pain, unspecified: Secondary | ICD-10-CM | POA: Diagnosis not present

## 2024-07-14 DIAGNOSIS — M545 Low back pain, unspecified: Secondary | ICD-10-CM | POA: Diagnosis not present

## 2024-07-18 ENCOUNTER — Other Ambulatory Visit: Payer: Self-pay | Admitting: Cardiovascular Disease

## 2024-07-18 ENCOUNTER — Other Ambulatory Visit: Payer: Self-pay

## 2024-07-18 ENCOUNTER — Other Ambulatory Visit: Payer: Self-pay | Admitting: Physician Assistant

## 2024-07-18 ENCOUNTER — Other Ambulatory Visit (HOSPITAL_COMMUNITY): Payer: Self-pay

## 2024-07-18 MED ORDER — FUROSEMIDE 20 MG PO TABS
20.0000 mg | ORAL_TABLET | Freq: Every day | ORAL | 2 refills | Status: DC
  Filled 2024-07-18: qty 30, 30d supply, fill #0

## 2024-07-19 ENCOUNTER — Other Ambulatory Visit (HOSPITAL_COMMUNITY): Payer: Self-pay

## 2024-07-19 DIAGNOSIS — M545 Low back pain, unspecified: Secondary | ICD-10-CM | POA: Diagnosis not present

## 2024-07-19 MED ORDER — ATORVASTATIN CALCIUM 20 MG PO TABS
20.0000 mg | ORAL_TABLET | Freq: Two times a day (BID) | ORAL | 1 refills | Status: AC
  Filled 2024-07-19: qty 105, 52d supply, fill #0
  Filled 2024-07-19: qty 75, 38d supply, fill #0

## 2024-07-26 DIAGNOSIS — H903 Sensorineural hearing loss, bilateral: Secondary | ICD-10-CM | POA: Diagnosis not present

## 2024-07-31 DIAGNOSIS — M545 Low back pain, unspecified: Secondary | ICD-10-CM | POA: Diagnosis not present

## 2024-08-02 DIAGNOSIS — M545 Low back pain, unspecified: Secondary | ICD-10-CM | POA: Diagnosis not present

## 2024-08-03 ENCOUNTER — Ambulatory Visit (INDEPENDENT_AMBULATORY_CARE_PROVIDER_SITE_OTHER): Payer: HMO

## 2024-08-03 VITALS — BP 118/68 | HR 75 | Temp 98.2°F | Ht 70.0 in | Wt 247.2 lb

## 2024-08-03 DIAGNOSIS — Z0000 Encounter for general adult medical examination without abnormal findings: Secondary | ICD-10-CM | POA: Diagnosis not present

## 2024-08-03 NOTE — Progress Notes (Signed)
 Subjective:   Mitchell Humphrey is a 83 y.o. who presents for a Medicare Wellness preventive visit.  As a reminder, Annual Wellness Visits don't include a physical exam, and some assessments may be limited, especially if this visit is performed virtually. We may recommend an in-person follow-up visit with your provider if needed.  Visit Complete: In person    Persons Participating in Visit: Patient.  AWV Questionnaire: No: Patient Medicare AWV questionnaire was not completed prior to this visit.  Cardiac Risk Factors include: advanced age (>77men, >23 women);dyslipidemia;hypertension;male gender;obesity (BMI >30kg/m2)     Objective:    Today's Vitals   08/03/24 1101  BP: 118/68  Pulse: 75  Temp: 98.2 F (36.8 C)  SpO2: 95%  Weight: 247 lb 3.2 oz (112.1 kg)  Height: 5' 10 (1.778 m)   Body mass index is 35.47 kg/m.     08/03/2024   11:07 AM 10/24/2023   10:36 PM 07/29/2023   11:45 AM 10/20/2022    1:59 PM 05/22/2022   11:21 AM 03/11/2022    9:55 AM 12/16/2020    4:43 AM  Advanced Directives  Does Patient Have a Medical Advance Directive? No No Yes Yes No No Yes  Type of Surveyor, minerals;Living will Healthcare Power of [place];Living will   Healthcare Power of [place];Living will  Does patient want to make changes to medical advance directive?       No - Patient declined  Copy of Healthcare Power of Attorney in Chart?   No - copy requested No - copy requested   No - copy requested  Would patient like information on creating a medical advance directive? No - Patient declined    No - Patient declined No - Patient declined No - Patient declined    Current Medications (verified) Outpatient Encounter Medications as of 08/03/2024  Medication Sig   acetaminophen  (TYLENOL ) 500 MG tablet Take 1,000 mg by mouth every 6 (six) hours as needed for moderate pain.   aspirin  EC 81 MG tablet Take 81 mg by mouth daily. Swallow whole.   atorvastatin  (LIPITOR)  20 MG tablet Take 1 tablet (20 mg total) by mouth in the morning and at bedtime.   cetirizine  (ZYRTEC ) 10 MG tablet Take 10 mg by mouth daily.   Cholecalciferol (VITAMIN D3) 125 MCG (5000 UT) CAPS Take 5,000 Units by mouth daily.   Cyanocobalamin (VITAMIN B 12 PO) Take 1 capsule by mouth daily in the afternoon.   diclofenac Sodium (VOLTAREN) 1 % GEL Apply 2 g topically daily as needed (pain).   ELDERBERRY PO Take 1 capsule by mouth daily.   finasteride  (PROSCAR ) 5 MG tablet Take 1 tablet (5 mg total) by mouth daily.   furosemide  (LASIX ) 20 MG tablet Take 20 mg by mouth daily.   ipratropium (ATROVENT ) 0.06 % nasal spray Place 2 sprays into both nostrils 4 (four) times daily.   ketorolac (ACULAR) 0.5 % ophthalmic solution    levothyroxine  (SYNTHROID ) 125 MCG tablet Take 1 tablet (125 mcg total) by mouth daily.   losartan  (COZAAR ) 50 MG tablet Take 1 tablet (50 mg total) by mouth 2 (two) times daily.   meloxicam  (MOBIC ) 15 MG tablet TAKE 1 TABLET (15 MG TOTAL) BY MOUTH DAILY.   Multiple Vitamins-Minerals (IMMUNE SUPPORT PO) Take 1 tablet by mouth daily. NHR Immune Protect   oxybutynin  (DITROPAN -XL) 5 MG 24 hr tablet Take 1 tablet (5 mg total) by mouth daily.   Polyethyl Glycol-Propyl Glycol (SYSTANE OP)  Place 1 drop into both eyes daily as needed (irritation).   spironolactone  (ALDACTONE ) 25 MG tablet Take 1 tablet (25 mg total) by mouth daily.   tamsulosin  (FLOMAX ) 0.4 MG CAPS capsule Take 1 capsule (0.4 mg total) by mouth daily.   [DISCONTINUED] furosemide  (LASIX ) 20 MG tablet Take 1 tablet (20 mg total) by mouth daily.   [DISCONTINUED] gabapentin  (NEURONTIN ) 300 MG capsule Take 1 capsule (300 mg total) by mouth 2 (two) times daily as needed for pain.   [DISCONTINUED] gabapentin  (NEURONTIN ) 300 MG capsule Take 1 capsule (300 mg total) by mouth 3 (three) times daily as needed for pain   No facility-administered encounter medications on file as of 08/03/2024.    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 Verlin and Dr. Lucas   Severe aortic stenosis    Past Surgical History:  Procedure Laterality Date   APPENDECTOMY     COLONOSCOPY  08/2008   Dr. Rosalie   HERNIA REPAIR  1970   [facility]   INTRAOPERATIVE TRANSTHORACIC ECHOCARDIOGRAM N/A 05/26/2022   Procedure: INTRAOPERATIVE TRANSTHORACIC ECHOCARDIOGRAM;  Surgeon: Verlin Lonni BIRCH, MD;  Location: [facility];  Service: Open Heart Surgery;  Laterality: N/A;   RIGHT HEART CATH AND CORONARY ANGIOGRAPHY N/A 03/11/2022   Procedure: RIGHT HEART CATH AND CORONARY ANGIOGRAPHY;  Surgeon: Verlin Lonni BIRCH, MD;  Location: [facility];  Service: Cardiovascular;  Laterality: N/A;   TRANSCATHETER AORTIC VALVE REPLACEMENT, TRANSFEMORAL N/A 05/26/2022   Procedure: Transcatheter Aortic Valve Replacement, Transfemoral;  Surgeon: Verlin Lonni BIRCH, MD;  Location: [facility];  Service: Open Heart Surgery;  Laterality: N/A;   Family History  Problem Relation Age of Onset   Breast cancer Mother    Lung cancer Father    Brain cancer Father    Breast cancer Sister    Heart attack Sister    Social History   Socioeconomic History   Marital status: Married    Spouse name: Not on file   Number of children: 3   Years of education: Not on file   Highest education level: Not on file  Occupational History   Occupation: Administrator, Civil Service   Occupation: Retired-Construction  Tobacco Use    Smoking status: Never   Smokeless tobacco: Never   Tobacco comments:    He chews on a cigar  Vaping Use   Vaping status: Never Used  Substance and Sexual Activity   Alcohol use: No   Drug use: No   Sexual activity: Not Currently  Other Topics Concern   Not on file  Social History Narrative   Superintendent for construction site   Lives with wife   2 step children   2 sons   Social Drivers of Corporate investment banker Strain: Low Risk  (08/03/2024)   Overall Financial Resource Strain (CARDIA)    Difficulty of Paying Living Expenses: Not hard at  all  Food Insecurity: No Food Insecurity (08/03/2024)   Hunger Vital Sign    Worried About Running Out of Food in the Last Year: Never true    Ran Out of Food in the Last Year: Never true  Transportation Needs: No Transportation Needs (08/03/2024)   PRAPARE - Administrator, Civil Service (Medical): No    Lack of Transportation (Non-Medical): No  Physical Activity: Sufficiently Active (08/03/2024)   Exercise Vital Sign    Days of Exercise per Week: 7 days    Minutes of Exercise per Session: 60 min  Stress: No Stress Concern Present (08/03/2024)   [employer] of Occupational Health - Occupational Stress Questionnaire    Feeling of Stress: Only a little  Social Connections: Moderately Isolated (08/03/2024)   Social Connection and Isolation Panel    Frequency of Communication with Friends and Family: More than three times a week    Frequency of Social Gatherings with Friends and Family: More than three times a week    Attends Religious Services: Never    Database administrator or Organizations: No    Attends Engineer, structural: Never    Marital Status: Married    Tobacco Counseling Counseling given: Not Answered Tobacco comments: He chews on a cigar    Clinical Intake:  Pre-visit preparation completed: Yes  Pain : No/denies pain     BMI - recorded: 35.47 Nutritional Status: BMI > 30   Obese Nutritional Risks: None Diabetes: No  Lab Results  Component Value Date   HGBA1C 6.3 (H) 12/11/2022   HGBA1C 5.9 (A) 07/06/2022   HGBA1C 6.4 (H) 01/07/2022     How often do you need to have someone help you when you read instructions, pamphlets, or other written materials from your doctor or pharmacy?: 1 - Never  Interpreter Needed?: No  Information entered by :: Ellouise Haws, LPN   Activities of Daily Living      08/03/2024   11:03 AM  In your present state of health, do you have any difficulty performing the following activities:  Hearing? 1  Comment hearing aids  Vision? 0  Difficulty concentrating or making decisions? 0  Walking or climbing stairs? 0  Dressing or bathing? 0  Doing errands, shopping? 0  Preparing Food and eating ? N  Using the Toilet? N  In the past six months, have you accidently leaked urine? Y  Comment wears a pad for urgency  Do you have problems with loss of bowel control? N  Managing your Medications? N  Managing your Finances? N  Housekeeping or managing your Housekeeping? N    Patient Care Team: Job Lukes, [place] as PCP - General (Physician Assistant) O'Neal, Darryle Ned, MD as PCP - Cardiology (Cardiology) Pura Lenis, MD as Referring Physician (Family Medicine)  I have updated your Care Teams any recent Medical Services you may have received from other providers in the past year.     Assessment:   This is a routine wellness examination for [place].  Hearing/Vision screen Hearing Screening - Comments:: Hearing aids  Vision Screening - Comments:: Wears rx glasses - up to date with routine eye exams with [facility]    Goals Addressed             This Visit's Progress    Patient Stated       Stay active and healthy        Depression Screen     08/03/2024   11:08 AM 12/13/2023  8:03 AM 11/16/2023    8:12 AM 07/29/2023   11:46 AM 12/16/2022    3:30 PM 10/20/2022    1:58 PM 07/06/2022    7:55 AM  PHQ 2/9  Scores  PHQ - 2 Score 0 0 0 0 0 0 0    Fall Risk     08/03/2024   11:09 AM 11/16/2023    8:12 AM 07/29/2023   11:48 AM 12/16/2022    3:31 PM 10/20/2022    2:00 PM  Fall Risk   Falls in the past year? 0 1 1 1 1   Number falls in past yr: 0 1 1 1 1   Injury with Fall? 0 1 1 1 1   Comment   right hip still discomfort PT following up  bruised and scratches arms and hip  Risk for fall due to : Impaired balance/gait;Impaired mobility No Fall Risks Impaired vision;Impaired balance/gait;Impaired mobility History of fall(s);Impaired balance/gait Impaired vision;Impaired balance/gait  Risk for fall due to: Comment   at times    Follow up Falls prevention discussed  Falls prevention discussed Falls evaluation completed Falls prevention discussed      Data saved with a previous flowsheet row definition    MEDICARE RISK AT HOME:  Medicare Risk at Home Any stairs in or around the home?: No If so, are there any without handrails?: No Home free of loose throw rugs in walkways, pet beds, electrical cords, etc?: Yes Adequate lighting in your home to reduce risk of falls?: Yes Life alert?: No Use of a cane, walker or w/c?: Yes Grab bars in the bathroom?: No Shower chair or bench in shower?: No Elevated toilet seat or a handicapped toilet?: No  TIMED UP AND GO:  Was the test performed?  Yes  Length of time to ambulate 10 feet: 15 sec Gait slow and steady without use of assistive device  Cognitive Function: 6CIT completed        08/03/2024   11:10 AM 07/29/2023   11:50 AM 10/20/2022    2:01 PM  6CIT Screen  What Year? 0 points 0 points 0 points  What month? 0 points 0 points 0 points  What time? 0 points 0 points 0 points  Count back from 20 0 points 0 points 0 points  Months in reverse 0 points 0 points 0 points  Repeat phrase 0 points 0 points 0 points  Total Score 0 points 0 points 0 points    Immunizations Immunization History  Administered Date(s) Administered   DTaP 02/08/2002    Fluad Quad(high Dose 65+) 09/04/2021, 08/14/2022   INFLUENZA, HIGH DOSE SEASONAL PF 09/02/2015, 08/09/2017, 08/23/2018, 01/08/2020   Influenza Split 08/20/2008   Influenza Whole 08/20/2008   Influenza-Unspecified 08/23/2020   Moderna Sars-Covid-2 Vaccination 02/22/2020, 03/23/2020   PFIZER(Purple Top)SARS-COV-2 Vaccination 01/27/2020, 02/17/2020   PPD Test 08/20/2008   Pneumococcal Conjugate-13 06/19/1997   Pneumococcal Polysaccharide-23 05/25/2012, 05/18/2018   Tdap 05/25/2012   Zoster Recombinant(Shingrix ) 01/15/2021, 07/18/2021   Zoster, Live 04/12/2007    Screening Tests Health Maintenance  Topic Date Due   Influenza Vaccine  06/16/2024   COVID-19 Vaccine (5 - 2025-26 season) 07/17/2024   DTaP/Tdap/Td (3 - Td or Tdap) 11/22/2024 (Originally 05/25/2022)   Medicare Annual Wellness (AWV)  08/03/2025   Pneumococcal Vaccine: 50+ Years  Completed   Zoster Vaccines- Shingrix   Completed   HPV VACCINES  Aged Out   Meningococcal B Vaccine  Aged Out    Health Maintenance Items Addressed: See Nurse Notes at the end of this  note  Additional Screening:  Vision Screening: Recommended annual ophthalmology exams for early detection of glaucoma and other disorders of the eye. Is the patient up to date with their annual eye exam?  Yes  Who is the provider or what is the name of the office in which the patient attends annual eye exams? [facility]   Dental Screening: Recommended annual dental exams for proper oral hygiene  Community Resource Referral / Chronic Care Management: CRR required this visit?  No   CCM required this visit?  No   Plan:    I have personally reviewed and noted the following in the patient's chart:   Medical and social history Use of alcohol, tobacco or illicit drugs  Current medications and supplements including opioid prescriptions. Patient is not currently taking opioid prescriptions. Functional ability and status Nutritional status Physical  activity Advanced directives List of other physicians Hospitalizations, surgeries, and ER visits in previous 12 months Vitals Screenings to include cognitive, depression, and falls Referrals and appointments  In addition, I have reviewed and discussed with patient certain preventive protocols, quality metrics, and best practice recommendations. A written personalized care plan for preventive services as well as general preventive health recommendations were provided to patient.   Ellouise VEAR Haws, LPN   [id number]   After Visit Summary: (In Person-Printed) AVS printed and given to the patient  Notes: Nothing significant to report at this time. Pt declined All vaccinations at this time

## 2024-08-03 NOTE — Patient Instructions (Signed)
 [name],  Thank you for taking the time for your Medicare Wellness Visit. I appreciate your continued commitment to your health goals. Please review the care plan we discussed, and feel free to reach out if I can assist you further.  Medicare recommends these wellness visits once per year to help you and your care team stay ahead of potential health issues. These visits are designed to focus on prevention, allowing your provider to concentrate on managing your acute and chronic conditions during your regular appointments.  Please note that Annual Wellness Visits do not include a physical exam. Some assessments may be limited, especially if the visit was conducted virtually. If needed, we may recommend a separate in-person follow-up with your provider.  Ongoing Care Seeing your primary care provider every 3 to 6 months helps us  monitor your health and provide consistent, personalized care.   Referrals If a referral was made during today's visit and you haven't received any updates within two weeks, please contact the referred provider directly to check on the status.  Recommended Screenings:  Health Maintenance  Topic Date Due   Flu Shot  06/16/2024   COVID-19 Vaccine (5 - 2025-26 season) 07/17/2024   Medicare Annual Wellness Visit  07/28/2024   DTaP/Tdap/Td vaccine (3 - Td or Tdap) 11/22/2024*   Pneumococcal Vaccine for age over 61  Completed   Zoster (Shingles) Vaccine  Completed   HPV Vaccine  Aged Out   Meningitis B Vaccine  Aged Out  *Topic was postponed. The date shown is not the original due date.       10/24/2023   10:36 PM  Advanced Directives  Does Patient Have a Medical Advance Directive? No   Advance Care Planning is important because it: Ensures you receive medical care that aligns with your values, goals, and preferences. Provides guidance to your family and loved ones, reducing the emotional burden of decision-making during critical moments.  Vision: Annual vision  screenings are recommended for early detection of glaucoma, cataracts, and diabetic retinopathy. These exams can also reveal signs of chronic conditions such as diabetes and high blood pressure.  Dental: Annual dental screenings help detect early signs of oral cancer, gum disease, and other conditions linked to overall health, including heart disease and diabetes.  Please see the attached documents for additional preventive care recommendations.

## 2024-08-04 DIAGNOSIS — M545 Low back pain, unspecified: Secondary | ICD-10-CM | POA: Diagnosis not present

## 2024-08-07 DIAGNOSIS — M545 Low back pain, unspecified: Secondary | ICD-10-CM | POA: Diagnosis not present

## 2024-08-09 DIAGNOSIS — M545 Low back pain, unspecified: Secondary | ICD-10-CM | POA: Diagnosis not present

## 2024-08-11 DIAGNOSIS — M545 Low back pain, unspecified: Secondary | ICD-10-CM | POA: Diagnosis not present

## 2024-08-14 ENCOUNTER — Other Ambulatory Visit (HOSPITAL_COMMUNITY): Payer: Self-pay

## 2024-08-14 ENCOUNTER — Other Ambulatory Visit: Payer: Self-pay | Admitting: Physician Assistant

## 2024-08-14 DIAGNOSIS — M545 Low back pain, unspecified: Secondary | ICD-10-CM | POA: Diagnosis not present

## 2024-08-15 ENCOUNTER — Other Ambulatory Visit (HOSPITAL_COMMUNITY): Payer: Self-pay

## 2024-08-16 DIAGNOSIS — M545 Low back pain, unspecified: Secondary | ICD-10-CM | POA: Diagnosis not present

## 2024-08-18 ENCOUNTER — Other Ambulatory Visit (HOSPITAL_COMMUNITY): Payer: Self-pay

## 2024-08-18 DIAGNOSIS — M545 Low back pain, unspecified: Secondary | ICD-10-CM | POA: Diagnosis not present

## 2024-08-21 DIAGNOSIS — M545 Low back pain, unspecified: Secondary | ICD-10-CM | POA: Diagnosis not present

## 2024-08-22 ENCOUNTER — Other Ambulatory Visit (HOSPITAL_COMMUNITY): Payer: Self-pay

## 2024-08-22 ENCOUNTER — Other Ambulatory Visit: Payer: Self-pay

## 2024-08-22 ENCOUNTER — Other Ambulatory Visit: Payer: Self-pay | Admitting: Cardiovascular Disease

## 2024-08-22 MED ORDER — FUROSEMIDE 20 MG PO TABS
20.0000 mg | ORAL_TABLET | Freq: Every day | ORAL | 2 refills | Status: AC
  Filled 2024-08-22: qty 30, 30d supply, fill #0
  Filled 2024-09-22: qty 30, 30d supply, fill #1
  Filled 2024-10-24: qty 30, 30d supply, fill #2

## 2024-08-25 DIAGNOSIS — M545 Low back pain, unspecified: Secondary | ICD-10-CM | POA: Diagnosis not present

## 2024-08-26 ENCOUNTER — Other Ambulatory Visit (HOSPITAL_COMMUNITY): Payer: Self-pay

## 2024-08-28 DIAGNOSIS — M545 Low back pain, unspecified: Secondary | ICD-10-CM | POA: Diagnosis not present

## 2024-08-30 DIAGNOSIS — M545 Low back pain, unspecified: Secondary | ICD-10-CM | POA: Diagnosis not present

## 2024-09-01 DIAGNOSIS — M545 Low back pain, unspecified: Secondary | ICD-10-CM | POA: Diagnosis not present

## 2024-09-05 ENCOUNTER — Other Ambulatory Visit (HOSPITAL_COMMUNITY): Payer: Self-pay

## 2024-09-05 ENCOUNTER — Other Ambulatory Visit: Payer: Self-pay

## 2024-09-05 DIAGNOSIS — R399 Unspecified symptoms and signs involving the genitourinary system: Secondary | ICD-10-CM | POA: Diagnosis not present

## 2024-09-05 DIAGNOSIS — N401 Enlarged prostate with lower urinary tract symptoms: Secondary | ICD-10-CM | POA: Diagnosis not present

## 2024-09-05 MED ORDER — TAMSULOSIN HCL 0.4 MG PO CAPS
0.4000 mg | ORAL_CAPSULE | Freq: Every day | ORAL | 3 refills | Status: AC
  Filled 2024-09-05 – 2024-10-11 (×2): qty 90, 90d supply, fill #0

## 2024-09-05 MED ORDER — OXYBUTYNIN CHLORIDE 5 MG PO TABS
5.0000 mg | ORAL_TABLET | Freq: Three times a day (TID) | ORAL | 3 refills | Status: AC
  Filled 2024-09-05: qty 270, 90d supply, fill #0

## 2024-09-06 DIAGNOSIS — M545 Low back pain, unspecified: Secondary | ICD-10-CM | POA: Diagnosis not present

## 2024-09-08 DIAGNOSIS — M545 Low back pain, unspecified: Secondary | ICD-10-CM | POA: Diagnosis not present

## 2024-09-09 ENCOUNTER — Other Ambulatory Visit: Payer: Self-pay | Admitting: Cardiovascular Disease

## 2024-09-09 ENCOUNTER — Other Ambulatory Visit: Payer: Self-pay | Admitting: Physician Assistant

## 2024-09-11 ENCOUNTER — Other Ambulatory Visit (HOSPITAL_COMMUNITY): Payer: Self-pay

## 2024-09-11 DIAGNOSIS — M545 Low back pain, unspecified: Secondary | ICD-10-CM | POA: Diagnosis not present

## 2024-09-11 MED ORDER — MELOXICAM 15 MG PO TABS
15.0000 mg | ORAL_TABLET | Freq: Every day | ORAL | 1 refills | Status: AC
  Filled 2024-09-11: qty 90, 90d supply, fill #0
  Filled 2024-12-20: qty 90, 90d supply, fill #1

## 2024-09-11 MED ORDER — LEVOTHYROXINE SODIUM 125 MCG PO TABS
125.0000 ug | ORAL_TABLET | Freq: Every day | ORAL | 5 refills | Status: AC
  Filled 2024-09-11: qty 30, 30d supply, fill #0
  Filled 2024-10-11: qty 30, 30d supply, fill #1
  Filled 2024-11-10: qty 30, 30d supply, fill #2
  Filled 2024-12-06: qty 30, 30d supply, fill #3

## 2024-09-12 ENCOUNTER — Other Ambulatory Visit: Payer: Self-pay

## 2024-09-12 ENCOUNTER — Other Ambulatory Visit (HOSPITAL_COMMUNITY): Payer: Self-pay

## 2024-09-12 MED ORDER — LOSARTAN POTASSIUM 50 MG PO TABS
50.0000 mg | ORAL_TABLET | Freq: Two times a day (BID) | ORAL | 1 refills | Status: AC
  Filled 2024-09-12: qty 180, 90d supply, fill #0
  Filled 2024-10-24: qty 60, 30d supply, fill #1

## 2024-09-18 DIAGNOSIS — M545 Low back pain, unspecified: Secondary | ICD-10-CM | POA: Diagnosis not present

## 2024-09-19 ENCOUNTER — Other Ambulatory Visit (HOSPITAL_COMMUNITY): Payer: Self-pay

## 2024-09-20 DIAGNOSIS — M545 Low back pain, unspecified: Secondary | ICD-10-CM | POA: Diagnosis not present

## 2024-09-22 ENCOUNTER — Other Ambulatory Visit (HOSPITAL_COMMUNITY): Payer: Self-pay

## 2024-09-23 ENCOUNTER — Other Ambulatory Visit (HOSPITAL_COMMUNITY): Payer: Self-pay

## 2024-09-25 DIAGNOSIS — M545 Low back pain, unspecified: Secondary | ICD-10-CM | POA: Diagnosis not present

## 2024-09-29 DIAGNOSIS — M545 Low back pain, unspecified: Secondary | ICD-10-CM | POA: Diagnosis not present

## 2024-10-02 DIAGNOSIS — M545 Low back pain, unspecified: Secondary | ICD-10-CM | POA: Diagnosis not present

## 2024-10-04 ENCOUNTER — Other Ambulatory Visit: Payer: Self-pay | Admitting: Physician Assistant

## 2024-10-04 ENCOUNTER — Other Ambulatory Visit: Payer: Self-pay

## 2024-10-04 ENCOUNTER — Other Ambulatory Visit (HOSPITAL_COMMUNITY): Payer: Self-pay

## 2024-10-04 MED ORDER — FINASTERIDE 5 MG PO TABS
5.0000 mg | ORAL_TABLET | Freq: Every day | ORAL | 1 refills | Status: AC
  Filled 2024-10-04: qty 90, 90d supply, fill #0

## 2024-10-06 DIAGNOSIS — M545 Low back pain, unspecified: Secondary | ICD-10-CM | POA: Diagnosis not present

## 2024-10-07 ENCOUNTER — Other Ambulatory Visit (HOSPITAL_COMMUNITY): Payer: Self-pay

## 2024-10-09 DIAGNOSIS — M545 Low back pain, unspecified: Secondary | ICD-10-CM | POA: Diagnosis not present

## 2024-10-11 ENCOUNTER — Other Ambulatory Visit (HOSPITAL_COMMUNITY): Payer: Self-pay

## 2024-10-13 ENCOUNTER — Other Ambulatory Visit (HOSPITAL_COMMUNITY): Payer: Self-pay

## 2024-10-16 DIAGNOSIS — M545 Low back pain, unspecified: Secondary | ICD-10-CM | POA: Diagnosis not present

## 2024-10-20 DIAGNOSIS — M545 Low back pain, unspecified: Secondary | ICD-10-CM | POA: Diagnosis not present

## 2024-10-23 DIAGNOSIS — M545 Low back pain, unspecified: Secondary | ICD-10-CM | POA: Diagnosis not present

## 2024-10-24 ENCOUNTER — Other Ambulatory Visit (HOSPITAL_COMMUNITY): Payer: Self-pay

## 2024-10-24 ENCOUNTER — Other Ambulatory Visit: Payer: Self-pay

## 2024-11-10 ENCOUNTER — Other Ambulatory Visit (HOSPITAL_COMMUNITY): Payer: Self-pay

## 2024-11-21 ENCOUNTER — Other Ambulatory Visit (HOSPITAL_COMMUNITY): Payer: Self-pay

## 2024-11-21 ENCOUNTER — Other Ambulatory Visit: Payer: Self-pay | Admitting: Cardiovascular Disease

## 2024-11-22 ENCOUNTER — Other Ambulatory Visit: Payer: Self-pay

## 2024-11-22 ENCOUNTER — Other Ambulatory Visit (HOSPITAL_COMMUNITY): Payer: Self-pay

## 2024-11-22 MED ORDER — SPIRONOLACTONE 25 MG PO TABS
25.0000 mg | ORAL_TABLET | Freq: Every day | ORAL | 0 refills | Status: AC
  Filled 2024-11-22: qty 90, 90d supply, fill #0

## 2024-12-06 ENCOUNTER — Other Ambulatory Visit (HOSPITAL_COMMUNITY): Payer: Self-pay

## 2024-12-20 ENCOUNTER — Other Ambulatory Visit (HOSPITAL_COMMUNITY): Payer: Self-pay

## 2025-08-07 ENCOUNTER — Ambulatory Visit
